# Patient Record
Sex: Male | Born: 1940 | Race: White | Hispanic: No | Marital: Married | Smoking: Never smoker
Health system: Southern US, Community
[De-identification: ages and names within clinical notes are randomized; demographics above are authoritative.]

## PROBLEM LIST (undated history)

## (undated) DIAGNOSIS — I519 Heart disease, unspecified: Secondary | ICD-10-CM

## (undated) DIAGNOSIS — E119 Type 2 diabetes mellitus without complications: Secondary | ICD-10-CM

## (undated) DIAGNOSIS — I10 Essential (primary) hypertension: Secondary | ICD-10-CM

## (undated) DIAGNOSIS — L570 Actinic keratosis: Secondary | ICD-10-CM

## (undated) DIAGNOSIS — I480 Paroxysmal atrial fibrillation: Secondary | ICD-10-CM

## (undated) HISTORY — DX: Actinic keratosis: L57.0

## (undated) HISTORY — DX: Morbid (severe) obesity due to excess calories: E66.01

## (undated) HISTORY — DX: Heart disease, unspecified: I51.9

## (undated) HISTORY — DX: Type 2 diabetes mellitus without complications: E11.9

## (undated) HISTORY — DX: Essential (primary) hypertension: I10

## (undated) HISTORY — DX: Paroxysmal atrial fibrillation: I48.0

---

## 2007-12-23 ENCOUNTER — Ambulatory Visit: Payer: Self-pay | Admitting: Family

## 2008-06-11 HISTORY — PX: INSERT / REPLACE / REMOVE PACEMAKER: SUR710

## 2009-03-03 ENCOUNTER — Other Ambulatory Visit: Payer: Self-pay | Admitting: Internal Medicine

## 2009-03-05 ENCOUNTER — Inpatient Hospital Stay: Payer: Self-pay | Admitting: Cardiovascular Disease

## 2009-08-16 DIAGNOSIS — D239 Other benign neoplasm of skin, unspecified: Secondary | ICD-10-CM

## 2009-08-16 HISTORY — DX: Other benign neoplasm of skin, unspecified: D23.9

## 2010-01-12 DIAGNOSIS — C4492 Squamous cell carcinoma of skin, unspecified: Secondary | ICD-10-CM

## 2010-01-12 HISTORY — DX: Squamous cell carcinoma of skin, unspecified: C44.92

## 2010-02-14 ENCOUNTER — Ambulatory Visit: Payer: Self-pay | Admitting: Ophthalmology

## 2010-02-27 ENCOUNTER — Ambulatory Visit: Payer: Self-pay | Admitting: Ophthalmology

## 2010-11-08 ENCOUNTER — Ambulatory Visit: Payer: Self-pay | Admitting: Ophthalmology

## 2010-11-14 ENCOUNTER — Ambulatory Visit: Payer: Self-pay | Admitting: Ophthalmology

## 2011-03-19 ENCOUNTER — Encounter: Payer: Self-pay | Admitting: Internal Medicine

## 2011-04-12 ENCOUNTER — Encounter: Payer: Self-pay | Admitting: Internal Medicine

## 2011-05-12 ENCOUNTER — Encounter: Payer: Self-pay | Admitting: Internal Medicine

## 2011-06-12 ENCOUNTER — Encounter: Payer: Self-pay | Admitting: Internal Medicine

## 2011-07-31 DIAGNOSIS — R42 Dizziness and giddiness: Secondary | ICD-10-CM | POA: Insufficient documentation

## 2011-07-31 DIAGNOSIS — I2510 Atherosclerotic heart disease of native coronary artery without angina pectoris: Secondary | ICD-10-CM | POA: Insufficient documentation

## 2011-07-31 DIAGNOSIS — E785 Hyperlipidemia, unspecified: Secondary | ICD-10-CM | POA: Insufficient documentation

## 2011-07-31 DIAGNOSIS — M1990 Unspecified osteoarthritis, unspecified site: Secondary | ICD-10-CM | POA: Insufficient documentation

## 2011-07-31 DIAGNOSIS — I25708 Atherosclerosis of coronary artery bypass graft(s), unspecified, with other forms of angina pectoris: Secondary | ICD-10-CM | POA: Insufficient documentation

## 2011-07-31 DIAGNOSIS — Z95810 Presence of automatic (implantable) cardiac defibrillator: Secondary | ICD-10-CM | POA: Insufficient documentation

## 2011-07-31 DIAGNOSIS — I255 Ischemic cardiomyopathy: Secondary | ICD-10-CM | POA: Insufficient documentation

## 2011-07-31 DIAGNOSIS — I472 Ventricular tachycardia: Secondary | ICD-10-CM | POA: Insufficient documentation

## 2011-08-01 DIAGNOSIS — I4891 Unspecified atrial fibrillation: Secondary | ICD-10-CM | POA: Insufficient documentation

## 2011-08-01 DIAGNOSIS — R350 Frequency of micturition: Secondary | ICD-10-CM | POA: Insufficient documentation

## 2011-09-03 ENCOUNTER — Ambulatory Visit: Payer: Self-pay | Admitting: Internal Medicine

## 2011-09-10 ENCOUNTER — Ambulatory Visit: Payer: Self-pay | Admitting: Internal Medicine

## 2011-10-10 ENCOUNTER — Ambulatory Visit: Payer: Self-pay | Admitting: Internal Medicine

## 2011-11-06 DIAGNOSIS — E119 Type 2 diabetes mellitus without complications: Secondary | ICD-10-CM | POA: Insufficient documentation

## 2012-09-11 DIAGNOSIS — Z86006 Personal history of melanoma in-situ: Secondary | ICD-10-CM

## 2012-09-11 HISTORY — DX: Personal history of melanoma in-situ: Z86.006

## 2012-09-24 HISTORY — PX: MELANOMA EXCISION: SHX5266

## 2012-10-16 ENCOUNTER — Ambulatory Visit: Payer: Self-pay | Admitting: General Surgery

## 2012-11-09 ENCOUNTER — Ambulatory Visit: Payer: Self-pay | Admitting: General Surgery

## 2012-11-11 DIAGNOSIS — Z85828 Personal history of other malignant neoplasm of skin: Secondary | ICD-10-CM

## 2012-11-11 HISTORY — DX: Personal history of other malignant neoplasm of skin: Z85.828

## 2014-07-18 DIAGNOSIS — I213 ST elevation (STEMI) myocardial infarction of unspecified site: Secondary | ICD-10-CM | POA: Insufficient documentation

## 2014-08-17 ENCOUNTER — Encounter: Admit: 2014-08-17 | Disposition: A | Discharge status: Home / Self Care | Payer: Self-pay

## 2014-09-10 ENCOUNTER — Emergency Department
Admit: 2014-09-10 | Disposition: A | Discharge status: Other Acute Inpatient Hospital | Payer: Self-pay | Admitting: Emergency Medicine

## 2014-09-10 ENCOUNTER — Encounter: Admit: 2014-09-10 | Disposition: A | Discharge status: Home / Self Care | Payer: Self-pay

## 2014-09-10 DIAGNOSIS — R079 Chest pain, unspecified: Secondary | ICD-10-CM | POA: Insufficient documentation

## 2014-09-10 LAB — COMPREHENSIVE METABOLIC PANEL
ALBUMIN: 4.5 g/dL
Alkaline Phosphatase: 59 U/L
Anion Gap: 6 — ABNORMAL LOW (ref 7–16)
BILIRUBIN TOTAL: 0.6 mg/dL
BUN: 18 mg/dL
Calcium, Total: 9 mg/dL
Chloride: 104 mmol/L
Co2: 26 mmol/L
Creatinine: 1.18 mg/dL
EGFR (Non-African Amer.): >60
Glucose: 166 mg/dL — ABNORMAL HIGH
Potassium: 4.6 mmol/L
SGOT(AST): 22 U/L
SGPT (ALT): 17 U/L
SODIUM: 136 mmol/L
TOTAL PROTEIN: 7.7 g/dL

## 2014-09-10 LAB — CBC
HCT: 41.7 % (ref 40.0–52.0)
HGB: 13.8 g/dL (ref 13.0–18.0)
MCH: 30.4 pg (ref 26.0–34.0)
MCHC: 33.1 g/dL (ref 32.0–36.0)
MCV: 92 fL (ref 80–100)
PLATELETS: 198 10*3/uL (ref 150–440)
RBC: 4.54 10*6/uL (ref 4.40–5.90)
RDW: 13.6 % (ref 11.5–14.5)
WBC: 7 10*3/uL (ref 3.8–10.6)

## 2014-09-10 LAB — TROPONIN I: Troponin-I: <0.03 ng/mL

## 2014-10-04 DIAGNOSIS — Z8589 Personal history of malignant neoplasm of other organs and systems: Secondary | ICD-10-CM

## 2014-10-04 HISTORY — DX: Personal history of malignant neoplasm of other organs and systems: Z85.89

## 2014-10-27 ENCOUNTER — Telehealth: Payer: Self-pay | Admitting: *Deleted

## 2014-10-27 NOTE — Telephone Encounter (Signed)
absent checking return to program status  LMOM

## 2014-10-31 ENCOUNTER — Encounter: Payer: Self-pay | Admitting: *Deleted

## 2014-10-31 NOTE — Progress Notes (Signed)
Documentation from previous EMR to update the Individualized Treatment Plan in [facility].

## 2014-11-02 ENCOUNTER — Encounter: Payer: Self-pay | Admitting: *Deleted

## 2014-11-02 NOTE — Progress Notes (Signed)
Cardiac Individual Treatment Plan  Patient Details  Name: Brandon Wall MRN: [id number] Date of Birth: Nov 08, 1940 Referring Provider:  Dr. Lurline Del Initial Encounter Date:  08/17/2014 Diagnosis  AMI  PTCA  Patient's Home Medications on Admission: No current outpatient prescriptions on file.  Past Medical History: No past medical history on file.  Tobacco Use: History  Smoking status  . Not on file  Smokeless tobacco  . Not on file    Labs: Recent Review Flowsheet Data    There is no flowsheet data to display.       Exercise Target Goals:    Exercise Program Goal: Individual exercise prescription set with THRR, safety & activity barriers. Participant demonstrates ability to understand and report RPE using BORG scale, to self-measure pulse accurately, and to acknowledge the importance of the exercise prescription.  Exercise Prescription Goal: Starting with aerobic activity 30 plus minutes a day, 3 days per week for initial exercise prescription. Provide home exercise prescription and guidelines that participant acknowledges understanding prior to discharge.  Activity Barriers & Risk Stratification:     Activity Barriers & Risk Stratification - 10/31/14 1254    Activity Barriers & Risk Stratification   Activity Barriers None   Risk Stratification High      6 Minute Walk:     6 Minute Walk      08/17/14 1255       6 Minute Walk   Phase Initial     Distance 1590 feet     Walk Time 6 minutes     Resting HR 66 bpm     Resting BP 98/62 mmHg     Max Ex. HR 84 bpm     Max Ex. BP 112/62 mmHg     RPE 12     Symptoms No        Initial Exercise Prescription:   Exercise Prescription Changes:     Exercise Prescription Changes      11/02/14 0700           Exercise Review   Progression No  Absent since last review          Discharge Exercise Prescription:   Nutrition:  Target Goals: Understanding of nutrition guidelines, daily intake  of sodium 1500mg , cholesterol 200mg , calories 30% from fat and 7% or less from saturated fats, daily to have 5 or more servings of fruits and vegetables.  Biometrics:      Post Biometrics - 08/17/14 1256     Post  Biometrics   Height 6\' 6"  (1.981 m)   Weight 243 lb (110.224 kg)   Waist Circumference 42 inches   Hip Circumference 44 inches   Waist to Hip Ratio 0.95 %   BMI (Calculated) 28.1      Nutrition Therapy Plan and Nutrition Goals:   Nutrition Discharge: Rate Your Plate Scores:   Nutrition Goals Re-Evaluation:   Psychosocial: Target Goals: Acknowledge presence or absence of depression, maximize coping skills, provide positive support system. Participant is able to verbalize types and ability to use techniques and skills needed for reducing stress and depression.  Initial Review & Psychosocial Screening:   Quality of Life Scores:     Quality of Life - 08/17/14 1259    Quality of Life Scores   Health/Function Pre 24.8 %   Socioeconomic Pre 28 %   Psych/Spiritual Pre 29.14 %   Family Pre 27.6 %   GLOBAL Pre 26.8 %      PHQ-9:  Recent Review Flowsheet Data    There is no flowsheet data to display.      Psychosocial Evaluation and Intervention:   Psychosocial Re-Evaluation:   Vocational Rehabilitation: Provide vocational rehab assistance to qualifying candidates.   Vocational Rehab Evaluation & Intervention:     Vocational Rehab - 10/31/14 1255    Initial Vocational Rehab Evaluation & Intervention   Assessment shows need for Vocational Rehabilitation No      Education: Education Goals: Education classes will be provided on a weekly basis, covering required topics. Participant will state understanding/return demonstration of topics presented.  Learning Barriers/Preferences:     Learning Barriers/Preferences - 10/31/14 1254    Learning Barriers/Preferences   Learning Barriers Hearing   Learning Preferences None      Education  Topics: General Nutrition Guidelines/Fats and Fiber: -Group instruction provided by verbal, written material, models and posters to present the general guidelines for heart healthy nutrition. Gives an explanation and review of dietary fats and fiber.   Controlling Sodium/Reading Food Labels: -Group verbal and written material supporting the discussion of sodium use in heart healthy nutrition. Review and explanation with models, verbal and written materials for utilization of the food label.   Exercise Physiology & Risk Factors: - Group verbal and written instruction with models to review the exercise physiology of the cardiovascular system and associated critical values. Details cardiovascular disease risk factors and the goals associated with each risk factor.   Aerobic Exercise & Resistance Training: - Gives group verbal and written discussion on the health impact of inactivity. On the components of aerobic and resistive training programs and the benefits of this training and how to safely progress through these programs.   Flexibility, Balance, General Exercise Guidelines: - Provides group verbal and written instruction on the benefits of flexibility and balance training programs. Provides general exercise guidelines with specific guidelines to those with heart or lung disease. Demonstration and skill practice provided.   Stress Management: - Provides group verbal and written instruction about the health risks of elevated stress, cause of high stress, and healthy ways to reduce stress.   Depression: - Provides group verbal and written instruction on the correlation between heart/lung disease and depressed mood, treatment options, and the stigmas associated with seeking treatment.   Anatomy & Physiology of the Heart: - Group verbal and written instruction and models provide basic cardiac anatomy and physiology, with the coronary electrical and arterial systems. Review of: AMI, Angina,  Valve disease, Heart Failure, Cardiac Arrhythmia, Pacemakers, and the ICD.   Cardiac Procedures: - Group verbal and written instruction and models to describe the testing methods done to diagnose heart disease. Reviews the outcomes of the test results. Describes the treatment choices: Medical Management, Angioplasty, or Coronary Bypass Surgery.   Cardiac Medications: - Group verbal and written instruction to review commonly prescribed medications for heart disease. Reviews the medication, class of the drug, and side effects. Includes the steps to properly store meds and maintain the prescription regimen.   Go Sex-Intimacy & Heart Disease, Get SMART - Goal Setting: - Group verbal and written instruction through game format to discuss heart disease and the return to sexual intimacy. Provides group verbal and written material to discuss and apply goal setting through the application of the S.M.A.R.T. Method.   Other Matters of the Heart: - Provides group verbal, written materials and models to describe Heart Failure, Angina, Valve Disease, and Diabetes in the realm of heart disease. Includes description of the disease process and treatment options  available to the cardiac patient.   Exercise & Equipment Safety: - Individual verbal instruction and demonstration of equipment use and safety with use of the equipment.   Infection Prevention: - Provides verbal and written material to individual with discussion of infection control including proper hand washing and proper equipment cleaning during exercise session.   Falls Prevention: - Provides verbal and written material to individual with discussion of falls prevention and safety.   Diabetes: - Individual verbal and written instruction to review signs/symptoms of diabetes, desired ranges of glucose level fasting, after meals and with exercise. Advice that pre and post exercise glucose checks will be done for 3 sessions at entry of  program.    Knowledge Questionnaire Score:   Personal Goals and Risk Factors at Admission:     Personal Goals and Risk Factors at Admission - 08/17/14 1257    Personal Goals and Risk Factors on Admission    Weight Management Yes   Intervention Learn and follow the exercise and diet guidelines while in the program. Utilize the nutrition and education classes to help gain knowledge of the diet and exercise expectations in the program   Admit Weight 243 lb (110.224 kg)   Goal Weight 220 lb (99.791 kg)   Diabetes Yes   Goal Blood glucose control identified by blood glucose values, HgbA1C. Participant verbalizes understanding of the signs/symptoms of hyper/hypo glycemia, proper foot care and importance of medication and nutrition plan for blood glucose control.   Intervention Provide nutrition & aerobic exercise along with prescribed medications to achieve blood glucose in normal ranges: Fasting 65-99 mg/dL   Hypertension Yes   Goal Participant will see blood pressure controlled within the values of 140/22mm/Hg or within value directed by their physician.   Intervention Provide nutrition & aerobic exercise along with prescribed medications to achieve BP 140/90 or less.   Lipids Yes   Goal Cholesterol controlled with medications as prescribed, with individualized exercise RX and with personalized nutrition plan. Value goals: LDL < 70mg , HDL > 40mg . Participant states understanding of desired cholesterol values and following prescriptions.   Intervention Provide nutrition & aerobic exercise along with prescribed medications to achieve LDL 70mg , HDL >40mg .   Stress No      Personal Goals and Risk Factors Review:    Personal Goals Discharge:     Comments: 30 day review   Has been out since 09/10/2014.  Calls made no response to calls.

## 2014-11-04 ENCOUNTER — Other Ambulatory Visit: Payer: Self-pay | Admitting: *Deleted

## 2014-11-04 DIAGNOSIS — I2111 ST elevation (STEMI) myocardial infarction involving right coronary artery: Secondary | ICD-10-CM

## 2014-11-10 ENCOUNTER — Encounter: Payer: Medicare Other | Attending: Internal Medicine

## 2014-11-10 DIAGNOSIS — Z9861 Coronary angioplasty status: Secondary | ICD-10-CM | POA: Insufficient documentation

## 2014-11-12 ENCOUNTER — Telehealth: Payer: Self-pay | Admitting: *Deleted

## 2014-11-12 NOTE — Telephone Encounter (Signed)
I called Brandon Wall and he wants to start back in Cardiac Rehab this Wed 11/17/2014. He had another heart cath but he reports they were unable to stent so they are treating it medically and with exercise.

## 2014-11-25 ENCOUNTER — Telehealth: Payer: Self-pay | Admitting: *Deleted

## 2014-11-25 NOTE — Telephone Encounter (Signed)
Left message for Colin  to call [facility] to let us know when he plans to return to the program

## 2014-11-29 ENCOUNTER — Encounter: Payer: Medicare Other | Admitting: *Deleted

## 2014-11-29 ENCOUNTER — Encounter: Payer: Self-pay | Admitting: *Deleted

## 2014-11-29 DIAGNOSIS — Z9861 Coronary angioplasty status: Secondary | ICD-10-CM | POA: Diagnosis not present

## 2014-11-29 NOTE — Progress Notes (Signed)
Daily Session Note  Patient Details  Name: Brandon Wall MRN: [id number] Date of Birth: 1940-08-07 Referring Provider:  [name], MD  Encounter Date: 11/29/2014  Check In:     Session Check In - 11/29/14 0942    Check-In   Staff Present Heath Lark RN, BSN, CCRP;Leonilda Cozby RN, BSN;Kelly Amedeo Plenty BS, ACSM CEP Exercise Physiologist   ER physicians immediately available to respond to emergencies See telemetry face sheet for immediately available ER MD   Medication changes reported     Yes   Fall or balance concerns reported    No   Warm-up and Cool-down Performed on first and last piece of equipment   VAD Patient? No   Pain Assessment   Currently in Pain? No/denies         Goals Met:  Proper associated with RPD/PD & O2 Sat Exercise tolerated well  Goals Unmet:  Not Applicable  Goals Comments: No chest pain today with exercise.   Dr. Emily Filbert is Medical Director for [place] and [name].

## 2014-11-30 ENCOUNTER — Encounter: Payer: Self-pay | Admitting: *Deleted

## 2014-11-30 DIAGNOSIS — Z9861 Coronary angioplasty status: Secondary | ICD-10-CM

## 2014-11-30 NOTE — Progress Notes (Signed)
Cardiac Individual Treatment Plan  Patient Details  Name: Brandon Wall MRN: [id number] Date of Birth: [birth year] Referring Provider:  Dr. Thompson Grayer Initial Encounter Date:  07/29/2014  Visit Diagnosis: S/P PTCA (percutaneous transluminal coronary angioplasty)  Patient's Home Medications on Admission:  Current outpatient prescriptions:  .  apixaban (ELIQUIS) 5 MG TABS tablet, TAKE 1 TABLET BY MOUTH TWICE DAILY, Disp: , Rfl:  .  clopidogrel (PLAVIX) 75 MG tablet, Take by mouth., Disp: , Rfl:  .  glipiZIDE (GLUCOTROL) 5 MG tablet, Take by mouth., Disp: , Rfl:  .  glucose blood test strip, , Disp: , Rfl:  .  isosorbide mononitrate (IMDUR) 30 MG 24 hr tablet, Take by mouth., Disp: , Rfl:  .  ketoconazole (NIZORAL) 2 % cream, , Disp: , Rfl:  .  losartan (COZAAR) 100 MG tablet, TAKE 1 TABLET BY MOUTH EVERY DAY, Disp: , Rfl:  .  metFORMIN (GLUCOPHAGE) 1000 MG tablet, Take by mouth., Disp: , Rfl:  .  metoprolol succinate (TOPROL-XL) 100 MG 24 hr tablet, Take by mouth., Disp: , Rfl:  .  nitroGLYCERIN (NITROSTAT) 0.4 MG SL tablet, Place under the tongue., Disp: , Rfl:  .  rosuvastatin (CRESTOR) 40 MG tablet, Take by mouth., Disp: , Rfl:   Past Medical History: No past medical history on file.  Tobacco Use: History  Smoking status  . Not on file  Smokeless tobacco  . Not on file    Labs: Recent Review Flowsheet Data    There is no flowsheet data to display.       Exercise Target Goals:    Exercise Program Goal: Individual exercise prescription set with THRR, safety & activity barriers. Participant demonstrates ability to understand and report RPE using BORG scale, to self-measure pulse accurately, and to acknowledge the importance of the exercise prescription.  Exercise Prescription Goal: Starting with aerobic activity 30 plus minutes a day, 3 days per week for initial exercise prescription. Provide home exercise prescription and guidelines that participant acknowledges  understanding prior to discharge.  Activity Barriers & Risk Stratification:     Activity Barriers & Risk Stratification - 10/31/14 1254    Activity Barriers & Risk Stratification   Activity Barriers None   Risk Stratification High      6 Minute Walk:     6 Minute Walk      08/17/14 1255       6 Minute Walk   Phase Initial     Distance 1590 feet     Walk Time 6 minutes     Resting HR 66 bpm     Resting BP 98/62 mmHg     Max Ex. HR 84 bpm     Max Ex. BP 112/62 mmHg     RPE 12     Symptoms No        Initial Exercise Prescription:   Exercise Prescription Changes:     Exercise Prescription Changes      11/02/14 0700 11/29/14 1600 11/30/14 1100       Exercise Review   Progression No  Absent since last review No  Returned 11/29/14, easing back into previous workloads      Response to Exercise   Blood Pressure (Admit)   126/70 mmHg     Blood Pressure (Exercise)   130/80 mmHg     Blood Pressure (Exit)   122/70 mmHg     Heart Rate (Admit)   73 bpm     Heart Rate (Exercise)   102 bpm  Heart Rate (Exit)   62 bpm     Rating of Perceived Exertion (Exercise)   11     Duration   Progress to 30 minutes of continuous aerobic without signs/symptoms of physical distress     Intensity   Rest + 30     Progression   Continue progressive overload as per policy without signs/symptoms or physical distress.     Resistance Training   Training Prescription   Yes     Weight   body weight     Reps   10-12     Treadmill   MPH   3.2     Grade   2     Minutes   15     NuStep   Level   2     Watts   40     Minutes   15     REL-XR   Level   5     Watts   80     Minutes   10        Discharge Exercise Prescription (Final Exercise Prescription Changes):     Exercise Prescription Changes - 11/30/14 1100    Response to Exercise   Blood Pressure (Admit) 126/70 mmHg   Blood Pressure (Exercise) 130/80 mmHg   Blood Pressure (Exit) 122/70 mmHg   Heart Rate (Admit) 73 bpm    Heart Rate (Exercise) 102 bpm   Heart Rate (Exit) 62 bpm   Rating of Perceived Exertion (Exercise) 11   Duration Progress to 30 minutes of continuous aerobic without signs/symptoms of physical distress   Intensity Rest + 30   Progression Continue progressive overload as per policy without signs/symptoms or physical distress.   Resistance Training   Training Prescription Yes   Weight body weight   Reps 10-12   Treadmill   MPH 3.2   Grade 2   Minutes 15   NuStep   Level 2   Watts 40   Minutes 15   REL-XR   Level 5   Watts 80   Minutes 10      Nutrition:  Target Goals: Understanding of nutrition guidelines, daily intake of sodium 1500mg , cholesterol 200mg , calories 30% from fat and 7% or less from saturated fats, daily to have 5 or more servings of fruits and vegetables.  Biometrics:      Post Biometrics - 08/17/14 1256     Post  Biometrics   Height 6\' 6"  (1.981 m)   Weight 243 lb (110.224 kg)   Waist Circumference 42 inches   Hip Circumference 44 inches   Waist to Hip Ratio 0.95 %   BMI (Calculated) 28.1      Nutrition Therapy Plan and Nutrition Goals:   Nutrition Discharge: Rate Your Plate Scores:   Nutrition Goals Re-Evaluation:     Nutrition Goals Re-Evaluation      11/29/14 0943           Personal Goal #1 Re-Evaluation   Personal Goal #1 Eats healthy already       Goal Progress Seen Yes          Psychosocial: Target Goals: Acknowledge presence or absence of depression, maximize coping skills, provide positive support system. Participant is able to verbalize types and ability to use techniques and skills needed for reducing stress and depression.  Initial Review & Psychosocial Screening:   Quality of Life Scores:     Quality of Life - 08/17/14 1259    Quality of Life Scores  Health/Function Pre 24.8 %   Socioeconomic Pre 28 %   Psych/Spiritual Pre 29.14 %   Family Pre 27.6 %   GLOBAL Pre 26.8 %      PHQ-9:     Recent Review  Flowsheet Data    Depression screen [facility] 2/9 11/29/2014   Decreased Interest 0   Down, Depressed, Hopeless 0   PHQ - 2 Score 0      Psychosocial Evaluation and Intervention:   Psychosocial Re-Evaluation:   Vocational Rehabilitation: Provide vocational rehab assistance to qualifying candidates.   Vocational Rehab Evaluation & Intervention:     Vocational Rehab - 10/31/14 1255    Initial Vocational Rehab Evaluation & Intervention   Assessment shows need for Vocational Rehabilitation No      Education: Education Goals: Education classes will be provided on a weekly basis, covering required topics. Participant will state understanding/return demonstration of topics presented.  Learning Barriers/Preferences:     Learning Barriers/Preferences - 10/31/14 1254    Learning Barriers/Preferences   Learning Barriers Hearing   Learning Preferences None      Education Topics: General Nutrition Guidelines/Fats and Fiber: -Group instruction provided by verbal, written material, models and posters to present the general guidelines for heart healthy nutrition. Gives an explanation and review of dietary fats and fiber.   Controlling Sodium/Reading Food Labels: -Group verbal and written material supporting the discussion of sodium use in heart healthy nutrition. Review and explanation with models, verbal and written materials for utilization of the food label.   Exercise Physiology & Risk Factors: - Group verbal and written instruction with models to review the exercise physiology of the cardiovascular system and associated critical values. Details cardiovascular disease risk factors and the goals associated with each risk factor.   Aerobic Exercise & Resistance Training: - Gives group verbal and written discussion on the health impact of inactivity. On the components of aerobic and resistive training programs and the benefits of this training and how to safely progress through these  programs.   Flexibility, Balance, General Exercise Guidelines: - Provides group verbal and written instruction on the benefits of flexibility and balance training programs. Provides general exercise guidelines with specific guidelines to those with heart or lung disease. Demonstration and skill practice provided.   Stress Management: - Provides group verbal and written instruction about the health risks of elevated stress, cause of high stress, and healthy ways to reduce stress.   Depression: - Provides group verbal and written instruction on the correlation between heart/lung disease and depressed mood, treatment options, and the stigmas associated with seeking treatment.   Anatomy & Physiology of the Heart: - Group verbal and written instruction and models provide basic cardiac anatomy and physiology, with the coronary electrical and arterial systems. Review of: AMI, Angina, Valve disease, Heart Failure, Cardiac Arrhythmia, Pacemakers, and the ICD.   Cardiac Procedures: - Group verbal and written instruction and models to describe the testing methods done to diagnose heart disease. Reviews the outcomes of the test results. Describes the treatment choices: Medical Management, Angioplasty, or Coronary Bypass Surgery.   Cardiac Medications: - Group verbal and written instruction to review commonly prescribed medications for heart disease. Reviews the medication, class of the drug, and side effects. Includes the steps to properly store meds and maintain the prescription regimen.   Go Sex-Intimacy & Heart Disease, Get SMART - Goal Setting: - Group verbal and written instruction through game format to discuss heart disease and the return to sexual intimacy. Provides group verbal and  written material to discuss and apply goal setting through the application of the S.M.A.R.T. Method.   Other Matters of the Heart: - Provides group verbal, written materials and models to describe Heart  Failure, Angina, Valve Disease, and Diabetes in the realm of heart disease. Includes description of the disease process and treatment options available to the cardiac patient.   Exercise & Equipment Safety: - Individual verbal instruction and demonstration of equipment use and safety with use of the equipment.   Infection Prevention: - Provides verbal and written material to individual with discussion of infection control including proper hand washing and proper equipment cleaning during exercise session.   Falls Prevention: - Provides verbal and written material to individual with discussion of falls prevention and safety.   Diabetes: - Individual verbal and written instruction to review signs/symptoms of diabetes, desired ranges of glucose level fasting, after meals and with exercise. Advice that pre and post exercise glucose checks will be done for 3 sessions at entry of program.    Knowledge Questionnaire Score:   Personal Goals and Risk Factors at Admission:     Personal Goals and Risk Factors at Admission - 11/29/14 0944    Personal Goals and Risk Factors on Admission   Increase Aerobic Exercise and Physical Activity Yes   Intervention While in program, learn and follow the exercise prescription taught. Start at a low level workload and increase workload after able to maintain previous level for 30 minutes. Increase time before increasing intensity.   Diabetes Yes   Goal Blood glucose control identified by blood glucose values, HgbA1C. Participant verbalizes understanding of the signs/symptoms of hyper/hypo glycemia, proper foot care and importance of medication and nutrition plan for blood glucose control.      Personal Goals and Risk Factors Review:      Goals and Risk Factor Review      11/30/14 1114           Weight Management   Goals Progress/Improvement seen No       Comments Weight flucuates 3-4 pounds. Has been out of program for symptoms.        Increase  Aerobic Exercise and Physical Activity   Goals Progress/Improvement seen  No       Comments Has been out of program for symptoms.       Diabetes   Goal Blood glucose control identified by blood glucose values, HgbA1C. Participant verbalizes understanding of the signs/symptoms of hyper/hypo glycemia, proper foot care and importance of medication and nutrition plan for blood glucose control.       Progress seen towards goals Unknown       Comments no comments provided       Hypertension   Goal Participant will see blood pressure controlled within the values of 140/24mm/Hg or within value directed by their physician.       Progress seen toward goals Yes       Comments BP within normal ranges       Abnormal Lipids   Progress seen towards goals Unknown       Comments No recent lab results to compare          Personal Goals Discharge:     Comments: 30 day review. Continue with ITP.    HAs been out for symptoms.  Cath done, unable to stent; medical management for symptoms at present time. Returned to program

## 2014-12-01 ENCOUNTER — Other Ambulatory Visit: Payer: Self-pay | Admitting: *Deleted

## 2014-12-01 DIAGNOSIS — Z9861 Coronary angioplasty status: Secondary | ICD-10-CM

## 2014-12-03 ENCOUNTER — Encounter: Payer: Medicare Other | Admitting: *Deleted

## 2014-12-03 DIAGNOSIS — Z9861 Coronary angioplasty status: Secondary | ICD-10-CM

## 2014-12-03 NOTE — Progress Notes (Signed)
Daily Session Note  Patient Details  Name: Brandon Wall MRN: [id number] Date of Birth: 1941/03/22 Referring Provider:  [name], MD  Encounter Date: 12/03/2014  Check In:      Goals Met:  Independence with exercise equipment Exercise tolerated well No report of cardiac concerns or symptoms Strength training completed today  Goals Unmet:  Not Applicable  Goals Comments:    Dr. Emily Filbert is Medical Director for [place] and [name].

## 2014-12-06 ENCOUNTER — Encounter: Payer: Medicare Other | Admitting: *Deleted

## 2014-12-06 DIAGNOSIS — Z9861 Coronary angioplasty status: Secondary | ICD-10-CM | POA: Diagnosis not present

## 2014-12-06 NOTE — Progress Notes (Signed)
Daily Session Note  Patient Details  Name: MCKOY BHAKTA MRN: [id number] Date of Birth: 1941-05-16 Referring Provider:  [name], MD  Encounter Date: 12/06/2014  Check In:     Session Check In - 12/06/14 1017    Check-In   Staff Present Heath Lark RN, BSN, CCRP;Kelly Hayes BS, ACSM CEP Exercise Physiologist;Elzy Tomasello [place] MS, ACSM CEP Exercise Physiologist   ER physicians immediately available to respond to emergencies See telemetry face sheet for immediately available ER MD   Medication changes reported     No   Fall or balance concerns reported    No   Warm-up and Cool-down Performed on first and last piece of equipment   VAD Patient? No   Pain Assessment   Currently in Pain? No/denies   Multiple Pain Sites No         Goals Met:  Independence with exercise equipment Exercise tolerated well No report of cardiac concerns or symptoms Strength training completed today  Goals Unmet:  Not Applicable  Goals Comments:   Dr. Emily Filbert is Medical Director for [place] and [name].

## 2014-12-10 ENCOUNTER — Encounter: Payer: Medicare Other | Attending: Cardiology | Admitting: *Deleted

## 2014-12-10 DIAGNOSIS — Z9861 Coronary angioplasty status: Secondary | ICD-10-CM | POA: Insufficient documentation

## 2014-12-10 NOTE — Progress Notes (Signed)
Daily Session Note  Patient Details  Name: TERRIUS GENTILE MRN: [id number] Date of Birth: Oct 18, 1940 Referring Provider:  [name], MD  Encounter Date: 12/10/2014  Check In:     Session Check In - 12/10/14 0908    Check-In   Staff Present Heath Lark RN, BSN, CCRP;Renee Dillard [place] MS, ACSM CEP Exercise Physiologist;Carroll Enterkin RN, BSN   ER physicians immediately available to respond to emergencies See telemetry face sheet for immediately available ER MD   Medication changes reported     No   Fall or balance concerns reported    No   Warm-up and Cool-down Performed on first and last piece of equipment   VAD Patient? No   Pain Assessment   Currently in Pain? No/denies         Goals Met:  Independence with exercise equipment Exercise tolerated well No report of cardiac concerns or symptoms Strength training completed today  Goals Unmet:  Not Applicable  Goals Comments: Jontavius has been doing well with exercise and medication for control of angina symptoms.  He does state that he is somewhat more fatigued, hopefully as he gets back to exercise routine this will resolve.  He is golfing 18 holes every Wednesday.   Dr. Emily Filbert is Medical Director for [place] and [name].

## 2014-12-14 ENCOUNTER — Encounter: Payer: Self-pay | Admitting: *Deleted

## 2014-12-20 ENCOUNTER — Encounter: Payer: Medicare Other | Admitting: *Deleted

## 2014-12-20 DIAGNOSIS — Z9861 Coronary angioplasty status: Secondary | ICD-10-CM

## 2014-12-20 NOTE — Progress Notes (Signed)
Daily Session Note  Patient Details  Name: DEMITRIOS MOLYNEUX MRN: [id number] Date of Birth: [birth year] Referring Provider:  No ref. provider found  Encounter Date: 12/20/2014  Check In:     Session Check In - 12/20/14 0909    Check-In   Staff Present Candiss Norse MS, ACSM CEP Exercise Physiologist;Susanne Bice RN, BSN, CCRP;Denae Zulueta Alfonso Patten, ACSM CEP Exercise Physiologist   ER physicians immediately available to respond to emergencies See telemetry face sheet for immediately available ER MD   Medication changes reported     No   Fall or balance concerns reported    No   Warm-up and Cool-down Performed on first and last piece of equipment   VAD Patient? No   Pain Assessment   Currently in Pain? No/denies   Multiple Pain Sites No         Goals Met:  Independence with exercise equipment Exercise tolerated well No report of cardiac concerns or symptoms  Goals Unmet:  Not Applicable  Goals Comments:    Dr. Emily Filbert is Medical Director for [place] and [name].

## 2014-12-27 DIAGNOSIS — Z9861 Coronary angioplasty status: Secondary | ICD-10-CM | POA: Diagnosis not present

## 2014-12-27 NOTE — Progress Notes (Signed)
Daily Session Note  Patient Details  Name: Brandon Wall MRN: [id number] Date of Birth: Apr 28, 1941 Referring Provider:  No ref. provider found  Encounter Date: 12/27/2014  Check In:     Session Check In - 12/27/14 1207    Check-In   Staff Present Heath Lark RN, BSN, CCRP;Kelly Hayes BS, ACSM CEP Exercise Physiologist;Renee [place] MS, ACSM CEP Exercise Physiologist   ER physicians immediately available to respond to emergencies See telemetry face sheet for immediately available ER MD   Medication changes reported     No   Fall or balance concerns reported    No   Warm-up and Cool-down Performed on first and last piece of equipment   VAD Patient? No   Pain Assessment   Currently in Pain? No/denies           Exercise Prescription Changes - 12/27/14 1200    Exercise Review   Progression No  Working slowly back to previous workloads   Response to Exercise   Blood Pressure (Admit) 110/68 mmHg   Blood Pressure (Exercise) 130/62 mmHg   Blood Pressure (Exit) 100/64 mmHg   Heart Rate (Admit) 76 bpm   Heart Rate (Exercise) 84 bpm   Heart Rate (Exit) 72 bpm   Rating of Perceived Exertion (Exercise) 12   Symptoms No   Duration Progress to 30 minutes of continuous aerobic without signs/symptoms of physical distress   Intensity Rest + 30   Progression Continue progressive overload as per policy without signs/symptoms or physical distress.   Resistance Training   Training Prescription Yes   Weight body weight   Reps 10-15   Interval Training   Interval Training Yes   Equipment REL-XR   Comments L4-6 40-70w   Treadmill   MPH 2.8   Grade 0   Minutes 10   NuStep   Level 2   Watts 40   Minutes 15   Recumbant Elliptical   Level 4   RPM 40   Watts 40   Minutes 20   REL-XR   Level 5   Watts 80   Minutes 10      Goals Met:  Independence with exercise equipment Exercise tolerated well No report of cardiac concerns or symptoms Strength training completed  today  Goals Unmet:  Not Applicable  Goals Comments: Progressing well with his exercise prescription.    Dr. Emily Filbert is Medical Director for [place] and [name].

## 2014-12-28 ENCOUNTER — Encounter: Payer: Self-pay | Admitting: *Deleted

## 2014-12-28 DIAGNOSIS — Z9861 Coronary angioplasty status: Secondary | ICD-10-CM

## 2014-12-28 NOTE — Progress Notes (Signed)
Cardiac Individual Treatment Plan  Patient Details  Name: Brandon Wall MRN: [id number] Date of Birth: Sep 09, 1940 Referring Provider:  No ref. provider found  Initial Encounter Date:    Visit Diagnosis: S/P PTCA (percutaneous transluminal coronary angioplasty)  Patient's Home Medications on Admission:  Current outpatient prescriptions:  .  apixaban (ELIQUIS) 5 MG TABS tablet, TAKE 1 TABLET BY MOUTH TWICE DAILY, Disp: , Rfl:  .  clopidogrel (PLAVIX) 75 MG tablet, Take by mouth., Disp: , Rfl:  .  glipiZIDE (GLUCOTROL) 5 MG tablet, Take by mouth., Disp: , Rfl:  .  glucose blood test strip, , Disp: , Rfl:  .  isosorbide mononitrate (IMDUR) 30 MG 24 hr tablet, Take by mouth., Disp: , Rfl:  .  ketoconazole (NIZORAL) 2 % cream, , Disp: , Rfl:  .  losartan (COZAAR) 100 MG tablet, TAKE 1 TABLET BY MOUTH EVERY DAY, Disp: , Rfl:  .  metFORMIN (GLUCOPHAGE) 1000 MG tablet, Take by mouth., Disp: , Rfl:  .  metoprolol succinate (TOPROL-XL) 100 MG 24 hr tablet, Take by mouth., Disp: , Rfl:  .  nitroGLYCERIN (NITROSTAT) 0.4 MG SL tablet, Place under the tongue., Disp: , Rfl:  .  rosuvastatin (CRESTOR) 40 MG tablet, Take by mouth., Disp: , Rfl:   Past Medical History: No past medical history on file.  Tobacco Use: History  Smoking status  . Not on file  Smokeless tobacco  . Not on file    Labs: Recent Review Flowsheet Data    There is no flowsheet data to display.       Exercise Target Goals:    Exercise Program Goal: Individual exercise prescription set with THRR, safety & activity barriers. Participant demonstrates ability to understand and report RPE using BORG scale, to self-measure pulse accurately, and to acknowledge the importance of the exercise prescription.  Exercise Prescription Goal: Starting with aerobic activity 30 plus minutes a day, 3 days per week for initial exercise prescription. Provide home exercise prescription and guidelines that participant acknowledges  understanding prior to discharge.  Activity Barriers & Risk Stratification:     Activity Barriers & Risk Stratification - 10/31/14 1254    Activity Barriers & Risk Stratification   Activity Barriers None   Risk Stratification High      6 Minute Walk:     6 Minute Walk      08/17/14 1255       6 Minute Walk   Phase Initial     Distance 1590 feet     Walk Time 6 minutes     Resting HR 66 bpm     Resting BP 98/62 mmHg     Max Ex. HR 84 bpm     Max Ex. BP 112/62 mmHg     RPE 12     Symptoms No        Initial Exercise Prescription:   Exercise Prescription Changes:     Exercise Prescription Changes      11/02/14 0700 11/29/14 1600 11/30/14 1100 12/14/14 0700 12/27/14 1200   Exercise Review   Progression No  Absent since last review No  Returned 11/29/14, easing back into previous workloads  No  Working slowly back to previous workloads No  Working slowly back to previous workloads   Response to Exercise   Blood Pressure (Admit)   126/70 mmHg 110/68 mmHg 110/68 mmHg   Blood Pressure (Exercise)   130/80 mmHg 130/62 mmHg 130/62 mmHg   Blood Pressure (Exit)   122/70 mmHg 100/64 mmHg 100/64  mmHg   Heart Rate (Admit)   73 bpm 76 bpm 76 bpm   Heart Rate (Exercise)   102 bpm 84 bpm 84 bpm   Heart Rate (Exit)   62 bpm 72 bpm 72 bpm   Rating of Perceived Exertion (Exercise)   11 12 12    Symptoms    No No   Duration   Progress to 30 minutes of continuous aerobic without signs/symptoms of physical distress Progress to 30 minutes of continuous aerobic without signs/symptoms of physical distress Progress to 30 minutes of continuous aerobic without signs/symptoms of physical distress   Intensity   Rest + 30 Rest + 30 Rest + 30   Progression   Continue progressive overload as per policy without signs/symptoms or physical distress. Continue progressive overload as per policy without signs/symptoms or physical distress. Continue progressive overload as per policy without  signs/symptoms or physical distress.   Resistance Training   Training Prescription   Yes Yes Yes   Weight   body weight body weight body weight   Reps   10-12 10-15 10-15   Interval Training   Interval Training    No Yes   Equipment     REL-XR   Comments     L4-6 40-70w   Treadmill   MPH   3.2 2.8 2.8   Grade   2 0 0   Minutes   15 10 10    NuStep   Level   2 2 2    Watts   40 40 40   Minutes   15 15 15    Recumbant Elliptical   Level    4 4   RPM    40 40   Watts    40 40   Minutes    20 20   REL-XR   Level   5 5 5    Watts   80 80 80   Minutes   10 10 10       Discharge Exercise Prescription (Final Exercise Prescription Changes):     Exercise Prescription Changes - 12/27/14 1200    Exercise Review   Progression No  Working slowly back to previous workloads   Response to Exercise   Blood Pressure (Admit) 110/68 mmHg   Blood Pressure (Exercise) 130/62 mmHg   Blood Pressure (Exit) 100/64 mmHg   Heart Rate (Admit) 76 bpm   Heart Rate (Exercise) 84 bpm   Heart Rate (Exit) 72 bpm   Rating of Perceived Exertion (Exercise) 12   Symptoms No   Duration Progress to 30 minutes of continuous aerobic without signs/symptoms of physical distress   Intensity Rest + 30   Progression Continue progressive overload as per policy without signs/symptoms or physical distress.   Resistance Training   Training Prescription Yes   Weight body weight   Reps 10-15   Interval Training   Interval Training Yes   Equipment REL-XR   Comments L4-6 40-70w   Treadmill   MPH 2.8   Grade 0   Minutes 10   NuStep   Level 2   Watts 40   Minutes 15   Recumbant Elliptical   Level 4   RPM 40   Watts 40   Minutes 20   REL-XR   Level 5   Watts 80   Minutes 10      Nutrition:  Target Goals: Understanding of nutrition guidelines, daily intake of sodium 1500mg , cholesterol 200mg , calories 30% from fat and 7% or less from saturated fats, daily to have  5 or more servings of fruits and  vegetables.  Biometrics:      Post Biometrics - 08/17/14 1256     Post  Biometrics   Height 6\' 6"  (1.981 m)   Weight 243 lb (110.224 kg)   Waist Circumference 42 inches   Hip Circumference 44 inches   Waist to Hip Ratio 0.95 %   BMI (Calculated) 28.1      Nutrition Therapy Plan and Nutrition Goals:   Nutrition Discharge: Rate Your Plate Scores:   Nutrition Goals Re-Evaluation:     Nutrition Goals Re-Evaluation      11/29/14 0943 12/20/14 1342         Personal Goal #1 Re-Evaluation   Personal Goal #1 Eats healthy already Eats healthy already      Goal Progress Seen Yes Yes      Comments  Continues to work on healthy eating habits.  Discussed steps to help. Looking at food labels, limiting fried foods(which [place] does).          Psychosocial: Target Goals: Acknowledge presence or absence of depression, maximize coping skills, provide positive support system. Participant is able to verbalize types and ability to use techniques and skills needed for reducing stress and depression.  Initial Review & Psychosocial Screening:   Quality of Life Scores:     Quality of Life - 08/17/14 1259    Quality of Life Scores   Health/Function Pre 24.8 %   Socioeconomic Pre 28 %   Psych/Spiritual Pre 29.14 %   Family Pre 27.6 %   GLOBAL Pre 26.8 %      PHQ-9:     Recent Review Flowsheet Data    Depression screen PHQ 2/9 11/29/2014   Decreased Interest 0   Down, Depressed, Hopeless 0   PHQ - 2 Score 0      Psychosocial Evaluation and Intervention:   Psychosocial Re-Evaluation:   Vocational Rehabilitation: Provide vocational rehab assistance to qualifying candidates.   Vocational Rehab Evaluation & Intervention:     Vocational Rehab - 10/31/14 1255    Initial Vocational Rehab Evaluation & Intervention   Assessment shows need for Vocational Rehabilitation No      Education: Education Goals: Education classes will be provided on a weekly basis, covering  required topics. Participant will state understanding/return demonstration of topics presented.  Learning Barriers/Preferences:     Learning Barriers/Preferences - 10/31/14 1254    Learning Barriers/Preferences   Learning Barriers Hearing   Learning Preferences None      Education Topics: General Nutrition Guidelines/Fats and Fiber: -Group instruction provided by verbal, written material, models and posters to present the general guidelines for heart healthy nutrition. Gives an explanation and review of dietary fats and fiber.          Cardiac Rehab from 11/29/2014 in [facility]   Date  11/29/14   Educator  C. Joneen Caraway, R   Instruction Review Code  2- meets goals/outcomes      Controlling Sodium/Reading Food Labels: -Group verbal and written material supporting the discussion of sodium use in heart healthy nutrition. Review and explanation with models, verbal and written materials for utilization of the food label.   Exercise Physiology & Risk Factors: - Group verbal and written instruction with models to review the exercise physiology of the cardiovascular system and associated critical values. Details cardiovascular disease risk factors and the goals associated with each risk factor.   Aerobic Exercise & Resistance Training: - Gives group verbal and written discussion on the  health impact of inactivity. On the components of aerobic and resistive training programs and the benefits of this training and how to safely progress through these programs.   Flexibility, Balance, General Exercise Guidelines: - Provides group verbal and written instruction on the benefits of flexibility and balance training programs. Provides general exercise guidelines with specific guidelines to those with heart or lung disease. Demonstration and skill practice provided.   Stress Management: - Provides group verbal and written instruction about the health risks of elevated stress, cause of  high stress, and healthy ways to reduce stress.   Depression: - Provides group verbal and written instruction on the correlation between heart/lung disease and depressed mood, treatment options, and the stigmas associated with seeking treatment.   Anatomy & Physiology of the Heart: - Group verbal and written instruction and models provide basic cardiac anatomy and physiology, with the coronary electrical and arterial systems. Review of: AMI, Angina, Valve disease, Heart Failure, Cardiac Arrhythmia, Pacemakers, and the ICD.   Cardiac Procedures: - Group verbal and written instruction and models to describe the testing methods done to diagnose heart disease. Reviews the outcomes of the test results. Describes the treatment choices: Medical Management, Angioplasty, or Coronary Bypass Surgery.   Cardiac Medications: - Group verbal and written instruction to review commonly prescribed medications for heart disease. Reviews the medication, class of the drug, and side effects. Includes the steps to properly store meds and maintain the prescription regimen.   Go Sex-Intimacy & Heart Disease, Get SMART - Goal Setting: - Group verbal and written instruction through game format to discuss heart disease and the return to sexual intimacy. Provides group verbal and written material to discuss and apply goal setting through the application of the S.M.A.R.T. Method.   Other Matters of the Heart: - Provides group verbal, written materials and models to describe Heart Failure, Angina, Valve Disease, and Diabetes in the realm of heart disease. Includes description of the disease process and treatment options available to the cardiac patient.   Exercise & Equipment Safety: - Individual verbal instruction and demonstration of equipment use and safety with use of the equipment.   Infection Prevention: - Provides verbal and written material to individual with discussion of infection control including proper  hand washing and proper equipment cleaning during exercise session.   Falls Prevention: - Provides verbal and written material to individual with discussion of falls prevention and safety.   Diabetes: - Individual verbal and written instruction to review signs/symptoms of diabetes, desired ranges of glucose level fasting, after meals and with exercise. Advice that pre and post exercise glucose checks will be done for 3 sessions at entry of program.    Knowledge Questionnaire Score:   Personal Goals and Risk Factors at Admission:     Personal Goals and Risk Factors at Admission - 11/29/14 0944    Personal Goals and Risk Factors on Admission   Increase Aerobic Exercise and Physical Activity Yes   Intervention While in program, learn and follow the exercise prescription taught. Start at a low level workload and increase workload after able to maintain previous level for 30 minutes. Increase time before increasing intensity.   Diabetes Yes   Goal Blood glucose control identified by blood glucose values, HgbA1C. Participant verbalizes understanding of the signs/symptoms of hyper/hypo glycemia, proper foot care and importance of medication and nutrition plan for blood glucose control.      Personal Goals and Risk Factors Review:      Goals and Risk Factor Review  11/30/14 1114 12/20/14 1344         Weight Management   Goals Progress/Improvement seen No Yes      Comments Weight flucuates 3-4 pounds. Has been out of program for symptoms.  Weight unchaged from admission. HAs been out for medical concerns.  Has noticed some looseness in clothing      Increase Aerobic Exercise and Physical Activity   Goals Progress/Improvement seen  No Yes      Comments Has been out of program for symptoms. Exercise routine is doing better.  Did review exercising at home. Has a stationary bike and will try to start using it 1 - 2 days a week      Diabetes   Goal Blood glucose control identified by  blood glucose values, HgbA1C. Participant verbalizes understanding of the signs/symptoms of hyper/hypo glycemia, proper foot care and importance of medication and nutrition plan for blood glucose control.       Progress seen towards goals Unknown Yes      Comments no comments provided       Hypertension   Goal Participant will see blood pressure controlled within the values of 140/48mm/Hg or within value directed by their physician.       Progress seen toward goals Yes Yes      Comments BP within normal ranges BP within normal ranges      Abnormal Lipids   Progress seen towards goals Unknown Unknown      Comments No recent lab results to compare No recent lab results to compare         Personal Goals Discharge:     Comments: 30 day review. Continue with ITP.

## 2014-12-29 ENCOUNTER — Other Ambulatory Visit: Payer: Self-pay | Admitting: *Deleted

## 2014-12-29 DIAGNOSIS — Z9861 Coronary angioplasty status: Secondary | ICD-10-CM

## 2014-12-31 DIAGNOSIS — Z9861 Coronary angioplasty status: Secondary | ICD-10-CM | POA: Diagnosis not present

## 2015-01-03 ENCOUNTER — Encounter: Payer: Medicare Other | Admitting: *Deleted

## 2015-01-03 DIAGNOSIS — Z9861 Coronary angioplasty status: Secondary | ICD-10-CM

## 2015-01-03 NOTE — Progress Notes (Signed)
Daily Session Note  Patient Details  Name: Brandon Wall MRN: [id number] Date of Birth: 07/27/1940 Referring Provider:  No ref. provider found  Encounter Date: 01/03/2015  Check In:     Session Check In - 01/03/15 0920    Check-In   Staff Present Renee MacMillan MS, ACSM CEP Exercise Physiologist;Kelly Hayes BS, ACSM CEP Exercise Physiologist;Susanne Bice RN, BSN, CCRP   ER physicians immediately available to respond to emergencies See telemetry face sheet for immediately available ER MD   Medication changes reported     No   Fall or balance concerns reported    No   Warm-up and Cool-down Performed on first and last piece of equipment   VAD Patient? No   Pain Assessment   Currently in Pain? No/denies   Multiple Pain Sites No         Goals Met:  Independence with exercise equipment Exercise tolerated well Personal goals reviewed No report of cardiac concerns or symptoms Strength training completed today  Goals Unmet:  Not Applicable  Goals Comments: Worked with [name] today on the REL to make an individualized HIIT routine. He was very receptive to the idea of interval training and enjoyed trying it. He said he will continue to do intervals on the REL and his routine will be reassessed weekly.    Dr. Mark Miller is Medical Director for [facility] and [name]. 

## 2015-01-05 DIAGNOSIS — Z9861 Coronary angioplasty status: Secondary | ICD-10-CM

## 2015-01-05 NOTE — Progress Notes (Signed)
Daily Session Note  Patient Details  Name: CARLSON BELLAND MRN: [id number] Date of Birth: 1940-10-13 Referring Provider:  No ref. provider found  Encounter Date: 01/05/2015  Check In:     Session Check In - 01/05/15 0837    Check-In   Staff Present Heath Lark RN, BSN, CCRP;Jonita Hirota BS, ACSM EP-C, Exercise Physiologist;Renee Dillard [place] MS, ACSM CEP Exercise Physiologist   ER physicians immediately available to respond to emergencies See telemetry face sheet for immediately available ER MD   Medication changes reported     No   Fall or balance concerns reported    No   Warm-up and Cool-down Performed on first and last piece of equipment   VAD Patient? No   Pain Assessment   Currently in Pain? No/denies         Goals Met:  Proper associated with RPD/PD & O2 Sat Exercise tolerated well No report of cardiac concerns or symptoms Strength training completed today  Goals Unmet:  Not Applicable  Goals Comments:    Dr. Emily Filbert is Medical Director for [place] and [name].

## 2015-01-07 DIAGNOSIS — Z9861 Coronary angioplasty status: Secondary | ICD-10-CM

## 2015-01-07 NOTE — Progress Notes (Signed)
Daily Session Note  Patient Details  Name: Brandon Wall MRN: [id number] Date of Birth: 1940/06/16 Referring Provider:  Rusty Aus, MD  Encounter Date: 01/07/2015  Check In:     Session Check In - 01/07/15 0856    Check-In   Staff Present Heath Lark RN, BSN, CCRP;Carroll Enterkin RN, BSN;Other   ER physicians immediately available to respond to emergencies See telemetry face sheet for immediately available ER MD   Medication changes reported     No   Fall or balance concerns reported    No   Warm-up and Cool-down Performed on first and last piece of equipment   VAD Patient? No   Pain Assessment   Currently in Pain? No/denies         Goals Met:  Independence with exercise equipment Exercise tolerated well Personal goals reviewed No report of cardiac concerns or symptoms Strength training completed today  Goals Unmet:  Not Applicable  Goals Comments: Doing well with exercise prescription progression.    Dr. Emily Filbert is Medical Director for [place] and [name].

## 2015-01-10 ENCOUNTER — Encounter: Payer: Medicare Other | Attending: Cardiology | Admitting: *Deleted

## 2015-01-10 DIAGNOSIS — Z9861 Coronary angioplasty status: Secondary | ICD-10-CM | POA: Insufficient documentation

## 2015-01-10 NOTE — Progress Notes (Signed)
Daily Session Note  Patient Details  Name: Brandon Wall MRN: [id number] Date of Birth: 05-03-1941 Referring Provider:  [name], MD  Encounter Date: 01/10/2015  Check In:     Session Check In - 01/10/15 1243    Check-In   Staff Present Heath Lark RN, BSN, CCRP;Renee Dillard Essex MS, ACSM CEP Exercise Physiologist;Other   ER physicians immediately available to respond to emergencies See telemetry face sheet for immediately available ER MD   Medication changes reported     No   Fall or balance concerns reported    No   Warm-up and Cool-down Performed on first and last piece of equipment   VAD Patient? No   Pain Assessment   Currently in Pain? No/denies         Goals Met:  Independence with exercise equipment Exercise tolerated well No report of cardiac concerns or symptoms  Goals Unmet:  Not Applicable  Goals Comments: Doing well with exercise prescription progression.  Staff: Hessie Knows [facility] Exercise Physiologist  Dr. Emily Filbert is Medical Director for [place] and [name].

## 2015-01-17 ENCOUNTER — Telehealth: Payer: Self-pay | Admitting: *Deleted

## 2015-01-17 NOTE — Telephone Encounter (Signed)
Called and said he did not attend Cardiac Rehab today since he has a sore throat and cough but no fever. Hopes to attend  this Wed.

## 2015-01-18 ENCOUNTER — Encounter: Payer: Self-pay | Admitting: *Deleted

## 2015-01-20 ENCOUNTER — Encounter: Payer: Self-pay | Admitting: *Deleted

## 2015-01-20 DIAGNOSIS — Z9861 Coronary angioplasty status: Secondary | ICD-10-CM

## 2015-01-20 NOTE — Progress Notes (Signed)
Cardiac Individual Treatment Plan  Patient Details  Name: Brandon Wall MRN: [id number] Date of Birth: [birth year] Referring Provider:  Dr. Thompson Grayer   Initial Encounter Date:    Visit Diagnosis: S/P PTCA (percutaneous transluminal coronary angioplasty)  Patient's Home Medications on Admission:  Current outpatient prescriptions:  .  apixaban (ELIQUIS) 5 MG TABS tablet, TAKE 1 TABLET BY MOUTH TWICE DAILY, Disp: , Rfl:  .  clopidogrel (PLAVIX) 75 MG tablet, Take by mouth., Disp: , Rfl:  .  glipiZIDE (GLUCOTROL) 5 MG tablet, Take by mouth., Disp: , Rfl:  .  glucose blood test strip, , Disp: , Rfl:  .  isosorbide mononitrate (IMDUR) 30 MG 24 hr tablet, Take by mouth., Disp: , Rfl:  .  ketoconazole (NIZORAL) 2 % cream, , Disp: , Rfl:  .  losartan (COZAAR) 100 MG tablet, TAKE 1 TABLET BY MOUTH EVERY DAY, Disp: , Rfl:  .  metFORMIN (GLUCOPHAGE) 1000 MG tablet, Take by mouth., Disp: , Rfl:  .  metoprolol succinate (TOPROL-XL) 100 MG 24 hr tablet, Take by mouth., Disp: , Rfl:  .  nitroGLYCERIN (NITROSTAT) 0.4 MG SL tablet, Place under the tongue., Disp: , Rfl:  .  rosuvastatin (CRESTOR) 40 MG tablet, Take by mouth., Disp: , Rfl:   Past Medical History: No past medical history on file.  Tobacco Use: History  Smoking status  . Not on file  Smokeless tobacco  . Not on file    Labs: Recent Review Flowsheet Data    There is no flowsheet data to display.       Exercise Target Goals:    Exercise Program Goal: Individual exercise prescription set with THRR, safety & activity barriers. Participant demonstrates ability to understand and report RPE using BORG scale, to self-measure pulse accurately, and to acknowledge the importance of the exercise prescription.  Exercise Prescription Goal: Starting with aerobic activity 30 plus minutes a day, 3 days per week for initial exercise prescription. Provide home exercise prescription and guidelines that participant acknowledges  understanding prior to discharge.  Activity Barriers & Risk Stratification:     Activity Barriers & Risk Stratification - 08/17/14 1500    Activity Barriers & Risk Stratification   Activity Barriers Arthritis   Risk Stratification High      6 Minute Walk:   Initial Exercise Prescription:   Exercise Prescription Changes:     Exercise Prescription Changes      11/29/14 1600 11/30/14 1100 12/14/14 0700 12/27/14 1200 01/18/15 0700   Exercise Review   Progression No  Returned 11/29/14, easing back into previous workloads  No  Working slowly back to previous workloads No  Working slowly back to previous workloads No  Multiple PVCs, no increases until hear back from MD   Response to Exercise   Blood Pressure (Admit)  126/70 mmHg 110/68 mmHg 110/68 mmHg 132/64 mmHg   Blood Pressure (Exercise)  130/80 mmHg 130/62 mmHg 130/62 mmHg 148/78 mmHg   Blood Pressure (Exit)  122/70 mmHg 100/64 mmHg 100/64 mmHg 120/68 mmHg   Heart Rate (Admit)  73 bpm 76 bpm 76 bpm 80 bpm   Heart Rate (Exercise)  102 bpm 84 bpm 84 bpm 105 bpm   Heart Rate (Exit)  62 bpm 72 bpm 72 bpm 85 bpm   Rating of Perceived Exertion (Exercise)  11 12 12 13    Symptoms   No No  Multiple PVCs, no symptoms with them. Sent info to MD   Duration  Progress to 30 minutes of continuous aerobic  without signs/symptoms of physical distress Progress to 30 minutes of continuous aerobic without signs/symptoms of physical distress Progress to 30 minutes of continuous aerobic without signs/symptoms of physical distress Progress to 30 minutes of continuous aerobic without signs/symptoms of physical distress   Intensity  Rest + 30 Rest + 30 Rest + 30 Rest + 30   Progression  Continue progressive overload as per policy without signs/symptoms or physical distress. Continue progressive overload as per policy without signs/symptoms or physical distress. Continue progressive overload as per policy without signs/symptoms or physical distress.  Continue progressive overload as per policy without signs/symptoms or physical distress.   Resistance Training   Training Prescription  Yes Yes Yes Yes   Weight  body weight body weight body weight 8   Reps  10-12 10-15 10-15 10-15   Interval Training   Interval Training   No Yes Yes   Equipment    REL-XR REL-XR   Comments    L4-6 40-70w L4-6 40-70w   Treadmill   MPH  3.2 2.8 2.8 2.8   Grade  2 0 0 0   Minutes  15 10 10 10    NuStep   Level  2 2 2 2    Watts  40 40 40 40   Minutes  15 15 15 15    Recumbant Elliptical   Level   4 4 4    RPM   40 40 40   Watts   40 40 40   Minutes   20 20 20    REL-XR   Level  5 5 5 5    Watts  80 80 80 80   Minutes  10 10 10 10       Discharge Exercise Prescription (Final Exercise Prescription Changes):     Exercise Prescription Changes - 01/18/15 0700    Exercise Review   Progression No  Multiple PVCs, no increases until hear back from MD   Response to Exercise   Blood Pressure (Admit) 132/64 mmHg   Blood Pressure (Exercise) 148/78 mmHg   Blood Pressure (Exit) 120/68 mmHg   Heart Rate (Admit) 80 bpm   Heart Rate (Exercise) 105 bpm   Heart Rate (Exit) 85 bpm   Rating of Perceived Exertion (Exercise) 13   Symptoms  Multiple PVCs, no symptoms with them. Sent info to MD   Duration Progress to 30 minutes of continuous aerobic without signs/symptoms of physical distress   Intensity Rest + 30   Progression Continue progressive overload as per policy without signs/symptoms or physical distress.   Resistance Training   Training Prescription Yes   Weight 8   Reps 10-15   Interval Training   Interval Training Yes   Equipment REL-XR   Comments L4-6 40-70w   Treadmill   MPH 2.8   Grade 0   Minutes 10   NuStep   Level 2   Watts 40   Minutes 15   Recumbant Elliptical   Level 4   RPM 40   Watts 40   Minutes 20   REL-XR   Level 5   Watts 80   Minutes 10      Nutrition:  Target Goals: Understanding of nutrition guidelines, daily  intake of sodium 1500mg , cholesterol 200mg , calories 30% from fat and 7% or less from saturated fats, daily to have 5 or more servings of fruits and vegetables.  Biometrics:    Nutrition Therapy Plan and Nutrition Goals:   Nutrition Discharge: Rate Your Plate Scores:   Nutrition Goals Re-Evaluation:  Nutrition Goals Re-Evaluation      11/29/14 0943 12/20/14 1342         Personal Goal #1 Re-Evaluation   Personal Goal #1 Eats healthy already Eats healthy already      Goal Progress Seen Yes Yes      Comments  Continues to work on healthy eating habits.  Discussed steps to help. Looking at food labels, limiting fried foods(which [place] does).          Psychosocial: Target Goals: Acknowledge presence or absence of depression, maximize coping skills, provide positive support system. Participant is able to verbalize types and ability to use techniques and skills needed for reducing stress and depression.  Initial Review & Psychosocial Screening:   Quality of Life Scores:   PHQ-9:     Recent Review Flowsheet Data    Depression screen [facility] 2/9 11/29/2014   Decreased Interest 0   Down, Depressed, Hopeless 0   PHQ - 2 Score 0      Psychosocial Evaluation and Intervention:   Psychosocial Re-Evaluation:   Vocational Rehabilitation: Provide vocational rehab assistance to qualifying candidates.   Vocational Rehab Evaluation & Intervention:   Education: Education Goals: Education classes will be provided on a weekly basis, covering required topics. Participant will state understanding/return demonstration of topics presented.  Learning Barriers/Preferences:     Learning Barriers/Preferences - 08/17/14 1500    Learning Barriers/Preferences   Learning Barriers None   Learning Preferences None      Education Topics: General Nutrition Guidelines/Fats and Fiber: -Group instruction provided by verbal, written material, models and posters to present the general  guidelines for heart healthy nutrition. Gives an explanation and review of dietary fats and fiber.          Cardiac Rehab from 01/05/2015 in [facility] Card[facility]   Date  11/29/14   Educator  C. Joneen Caraway, R   Instruction Review Code  2- meets goals/outcomes      Controlling Sodium/Reading Food Labels: -Group verbal and written material supporting the discussion of sodium use in heart healthy nutrition. Review and explanation with models, verbal and written materials for utilization of the food label.   Exercise Physiology & Risk Factors: - Group verbal and written instruction with models to review the exercise physiology of the cardiovascular system and associated critical values. Details cardiovascular disease risk factors and the goals associated with each risk factor.   Aerobic Exercise & Resistance Training: - Gives group verbal and written discussion on the health impact of inactivity. On the components of aerobic and resistive training programs and the benefits of this training and how to safely progress through these programs.   Flexibility, Balance, General Exercise Guidelines: - Provides group verbal and written instruction on the benefits of flexibility and balance training programs. Provides general exercise guidelines with specific guidelines to those with heart or lung disease. Demonstration and skill practice provided.   Stress Management: - Provides group verbal and written instruction about the health risks of elevated stress, cause of high stress, and healthy ways to reduce stress.   Depression: - Provides group verbal and written instruction on the correlation between heart/lung disease and depressed mood, treatment options, and the stigmas associated with seeking treatment.   Anatomy & Physiology of the Heart: - Group verbal and written instruction and models provide basic cardiac anatomy and physiology, with the coronary electrical and arterial systems. Review of:  AMI, Angina, Valve disease, Heart Failure, Cardiac Arrhythmia, Pacemakers, and the ICD.   Cardiac Procedures: - Group  verbal and written instruction and models to describe the testing methods done to diagnose heart disease. Reviews the outcomes of the test results. Describes the treatment choices: Medical Management, Angioplasty, or Coronary Bypass Surgery.   Cardiac Medications: - Group verbal and written instruction to review commonly prescribed medications for heart disease. Reviews the medication, class of the drug, and side effects. Includes the steps to properly store meds and maintain the prescription regimen.   Go Sex-Intimacy & Heart Disease, Get SMART - Goal Setting: - Group verbal and written instruction through game format to discuss heart disease and the return to sexual intimacy. Provides group verbal and written material to discuss and apply goal setting through the application of the S.M.A.R.T. Method.   Other Matters of the Heart: - Provides group verbal, written materials and models to describe Heart Failure, Angina, Valve Disease, and Diabetes in the realm of heart disease. Includes description of the disease process and treatment options available to the cardiac patient.   Exercise & Equipment Safety: - Individual verbal instruction and demonstration of equipment use and safety with use of the equipment.   Infection Prevention: - Provides verbal and written material to individual with discussion of infection control including proper hand washing and proper equipment cleaning during exercise session.   Falls Prevention: - Provides verbal and written material to individual with discussion of falls prevention and safety.   Diabetes: - Individual verbal and written instruction to review signs/symptoms of diabetes, desired ranges of glucose level fasting, after meals and with exercise. Advice that pre and post exercise glucose checks will be done for 3 sessions at entry  of program.    Knowledge Questionnaire Score:   Personal Goals and Risk Factors at Admission:     Personal Goals and Risk Factors at Admission - 11/29/14 0944    Personal Goals and Risk Factors on Admission   Increase Aerobic Exercise and Physical Activity Yes   Intervention While in program, learn and follow the exercise prescription taught. Start at a low level workload and increase workload after able to maintain previous level for 30 minutes. Increase time before increasing intensity.   Diabetes Yes   Goal Blood glucose control identified by blood glucose values, HgbA1C. Participant verbalizes understanding of the signs/symptoms of hyper/hypo glycemia, proper foot care and importance of medication and nutrition plan for blood glucose control.      Personal Goals and Risk Factors Review:      Goals and Risk Factor Review      11/30/14 1114 12/20/14 1344 01/07/15 1312       Weight Management   Goals Progress/Improvement seen No Yes Yes     Comments Weight flucuates 3-4 pounds. Has been out of program for symptoms.  Weight unchaged from admission. HAs been out for medical concerns.  Has noticed some looseness in clothing Continues to notice inches dropping.     Increase Aerobic Exercise and Physical Activity   Goals Progress/Improvement seen  No Yes      Comments Has been out of program for symptoms. Exercise routine is doing better.  Did review exercising at home. Has a stationary bike and will try to start using it 1 - 2 days a week Doing well with Exercise prescription     Diabetes   Goal Blood glucose control identified by blood glucose values, HgbA1C. Participant verbalizes understanding of the signs/symptoms of hyper/hypo glycemia, proper foot care and importance of medication and nutrition plan for blood glucose control.  Progress seen towards goals Unknown Yes Yes     Comments no comments provided  FBS 120-140  Hopes to see HgbA1C results lower.     Hypertension    Goal Participant will see blood pressure controlled within the values of 140/88mm/Hg or within value directed by their physician.       Progress seen toward goals Yes Yes      Comments BP within normal ranges BP within normal ranges      Abnormal Lipids   Progress seen towards goals Unknown Unknown      Comments No recent lab results to compare No recent lab results to compare         Personal Goals Discharge:     Comments: 30 day review

## 2015-01-24 ENCOUNTER — Encounter: Payer: Medicare Other | Admitting: *Deleted

## 2015-01-24 DIAGNOSIS — Z9861 Coronary angioplasty status: Secondary | ICD-10-CM

## 2015-01-24 NOTE — Progress Notes (Signed)
Daily Session Note  Patient Details  Name: Brandon Wall MRN: [id number] Date of Birth: 04/24/1941 Referring Provider:  No ref. provider found  Encounter Date: 01/24/2015  Check In:     Session Check In - 01/24/15 0856    Check-In   Staff Present Earlean Shawl BS, ACSM CEP Exercise Physiologist;Carroll Enterkin RN, BSN;Steven Way BS, ACSM EP-C, Exercise Physiologist   ER physicians immediately available to respond to emergencies See telemetry face sheet for immediately available ER MD   Medication changes reported     No   Fall or balance concerns reported    No   Warm-up and Cool-down Performed on first and last piece of equipment   VAD Patient? No   Pain Assessment   Currently in Pain? No/denies   Multiple Pain Sites No         Goals Met:  Independence with exercise equipment Exercise tolerated well No report of cardiac concerns or symptoms Strength training completed today  Goals Unmet:  Not Applicable  Goals Comments:    Dr. Emily Filbert is Medical Director for [name].

## 2015-01-26 ENCOUNTER — Other Ambulatory Visit: Payer: Self-pay | Admitting: *Deleted

## 2015-01-26 DIAGNOSIS — Z9861 Coronary angioplasty status: Secondary | ICD-10-CM

## 2015-01-26 NOTE — Progress Notes (Signed)
Cardiac Individual Treatment Plan  Patient Details  Name: Brandon Wall MRN: [id number] Date of Birth: May 19, 1941 Referring Provider:  No ref. provider found  Initial Encounter Date:    Visit Diagnosis: No diagnosis found.  Patient's Home Medications on Admission:  Current outpatient prescriptions:  .  apixaban (ELIQUIS) 5 MG TABS tablet, TAKE 1 TABLET BY MOUTH TWICE DAILY, Disp: , Rfl:  .  clopidogrel (PLAVIX) 75 MG tablet, Take by mouth., Disp: , Rfl:  .  glipiZIDE (GLUCOTROL) 5 MG tablet, Take by mouth., Disp: , Rfl:  .  glucose blood test strip, , Disp: , Rfl:  .  isosorbide mononitrate (IMDUR) 30 MG 24 hr tablet, Take by mouth., Disp: , Rfl:  .  ketoconazole (NIZORAL) 2 % cream, , Disp: , Rfl:  .  losartan (COZAAR) 100 MG tablet, TAKE 1 TABLET BY MOUTH EVERY DAY, Disp: , Rfl:  .  metFORMIN (GLUCOPHAGE) 1000 MG tablet, Take by mouth., Disp: , Rfl:  .  metoprolol succinate (TOPROL-XL) 100 MG 24 hr tablet, Take by mouth., Disp: , Rfl:  .  nitroGLYCERIN (NITROSTAT) 0.4 MG SL tablet, Place under the tongue., Disp: , Rfl:  .  rosuvastatin (CRESTOR) 40 MG tablet, Take by mouth., Disp: , Rfl:   Past Medical History: No past medical history on file.  Tobacco Use: History  Smoking status  . Not on file  Smokeless tobacco  . Not on file    Labs: Recent Review Flowsheet Data    There is no flowsheet data to display.       Exercise Target Goals:    Exercise Program Goal: Individual exercise prescription set with THRR, safety & activity barriers. Participant demonstrates ability to understand and report RPE using BORG scale, to self-measure pulse accurately, and to acknowledge the importance of the exercise prescription.  Exercise Prescription Goal: Starting with aerobic activity 30 plus minutes a day, 3 days per week for initial exercise prescription. Provide home exercise prescription and guidelines that participant acknowledges understanding prior to  discharge.  Activity Barriers & Risk Stratification:     Activity Barriers & Risk Stratification - 08/17/14 1500    Activity Barriers & Risk Stratification   Activity Barriers Arthritis   Risk Stratification High      6 Minute Walk:   Initial Exercise Prescription:   Exercise Prescription Changes:     Exercise Prescription Changes      11/29/14 1600 11/30/14 1100 12/14/14 0700 12/27/14 1200 01/18/15 0700   Exercise Review   Progression No  Returned 11/29/14, easing back into previous workloads  No  Working slowly back to previous workloads No  Working slowly back to previous workloads No  Multiple PVCs, no increases until hear back from MD   Response to Exercise   Blood Pressure (Admit)  126/70 mmHg 110/68 mmHg 110/68 mmHg 132/64 mmHg   Blood Pressure (Exercise)  130/80 mmHg 130/62 mmHg 130/62 mmHg 148/78 mmHg   Blood Pressure (Exit)  122/70 mmHg 100/64 mmHg 100/64 mmHg 120/68 mmHg   Heart Rate (Admit)  73 bpm 76 bpm 76 bpm 80 bpm   Heart Rate (Exercise)  102 bpm 84 bpm 84 bpm 105 bpm   Heart Rate (Exit)  62 bpm 72 bpm 72 bpm 85 bpm   Rating of Perceived Exertion (Exercise)  11 12 12 13    Symptoms   No No  Multiple PVCs, no symptoms with them. Sent info to MD   Duration  Progress to 30 minutes of continuous aerobic without signs/symptoms of physical  distress Progress to 30 minutes of continuous aerobic without signs/symptoms of physical distress Progress to 30 minutes of continuous aerobic without signs/symptoms of physical distress Progress to 30 minutes of continuous aerobic without signs/symptoms of physical distress   Intensity  Rest + 30 Rest + 30 Rest + 30 Rest + 30   Progression  Continue progressive overload as per policy without signs/symptoms or physical distress. Continue progressive overload as per policy without signs/symptoms or physical distress. Continue progressive overload as per policy without signs/symptoms or physical distress. Continue progressive overload  as per policy without signs/symptoms or physical distress.   Resistance Training   Training Prescription  Yes Yes Yes Yes   Weight  body weight body weight body weight 8   Reps  10-12 10-15 10-15 10-15   Interval Training   Interval Training   No Yes Yes   Equipment    REL-XR REL-XR   Comments    L4-6 40-70w L4-6 40-70w   Treadmill   MPH  3.2 2.8 2.8 2.8   Grade  2 0 0 0   Minutes  15 10 10 10    NuStep   Level  2 2 2 2    Watts  40 40 40 40   Minutes  15 15 15 15    Recumbant Elliptical   Level   4 4 4    RPM   40 40 40   Watts   40 40 40   Minutes   20 20 20    REL-XR   Level  5 5 5 5    Watts  80 80 80 80   Minutes  10 10 10 10       Discharge Exercise Prescription (Final Exercise Prescription Changes):     Exercise Prescription Changes - 01/18/15 0700    Exercise Review   Progression No  Multiple PVCs, no increases until hear back from MD   Response to Exercise   Blood Pressure (Admit) 132/64 mmHg   Blood Pressure (Exercise) 148/78 mmHg   Blood Pressure (Exit) 120/68 mmHg   Heart Rate (Admit) 80 bpm   Heart Rate (Exercise) 105 bpm   Heart Rate (Exit) 85 bpm   Rating of Perceived Exertion (Exercise) 13   Symptoms  Multiple PVCs, no symptoms with them. Sent info to MD   Duration Progress to 30 minutes of continuous aerobic without signs/symptoms of physical distress   Intensity Rest + 30   Progression Continue progressive overload as per policy without signs/symptoms or physical distress.   Resistance Training   Training Prescription Yes   Weight 8   Reps 10-15   Interval Training   Interval Training Yes   Equipment REL-XR   Comments L4-6 40-70w   Treadmill   MPH 2.8   Grade 0   Minutes 10   NuStep   Level 2   Watts 40   Minutes 15   Recumbant Elliptical   Level 4   RPM 40   Watts 40   Minutes 20   REL-XR   Level 5   Watts 80   Minutes 10      Nutrition:  Target Goals: Understanding of nutrition guidelines, daily intake of sodium 1500mg ,  cholesterol 200mg , calories 30% from fat and 7% or less from saturated fats, daily to have 5 or more servings of fruits and vegetables.  Biometrics:    Nutrition Therapy Plan and Nutrition Goals:   Nutrition Discharge: Rate Your Plate Scores:   Nutrition Goals Re-Evaluation:     Nutrition Goals  Re-Evaluation      11/29/14 0943 12/20/14 1342         Personal Goal #1 Re-Evaluation   Personal Goal #1 Eats healthy already Eats healthy already      Goal Progress Seen Yes Yes      Comments  Continues to work on healthy eating habits.  Discussed steps to help. Looking at food labels, limiting fried foods(which March does).          Psychosocial: Target Goals: Acknowledge presence or absence of depression, maximize coping skills, provide positive support system. Participant is able to verbalize types and ability to use techniques and skills needed for reducing stress and depression.  Initial Review & Psychosocial Screening:   Quality of Life Scores:   PHQ-9:     Recent Review Flowsheet Data    Depression screen [facility] 2/9 11/29/2014   Decreased Interest 0   Down, Depressed, Hopeless 0   PHQ - 2 Score 0      Psychosocial Evaluation and Intervention:   Psychosocial Re-Evaluation:   Vocational Rehabilitation: Provide vocational rehab assistance to qualifying candidates.   Vocational Rehab Evaluation & Intervention:   Education: Education Goals: Education classes will be provided on a weekly basis, covering required topics. Participant will state understanding/return demonstration of topics presented.  Learning Barriers/Preferences:     Learning Barriers/Preferences - 08/17/14 1500    Learning Barriers/Preferences   Learning Barriers None   Learning Preferences None      Education Topics: General Nutrition Guidelines/Fats and Fiber: -Group instruction provided by verbal, written material, models and posters to present the general guidelines for heart  healthy nutrition. Gives an explanation and review of dietary fats and fiber.          Cardiac Rehab from 01/05/2015 in [facility]   Date  11/29/14   Educator  C. Joneen Caraway, R   Instruction Review Code  2- meets goals/outcomes      Controlling Sodium/Reading Food Labels: -Group verbal and written material supporting the discussion of sodium use in heart healthy nutrition. Review and explanation with models, verbal and written materials for utilization of the food label.   Exercise Physiology & Risk Factors: - Group verbal and written instruction with models to review the exercise physiology of the cardiovascular system and associated critical values. Details cardiovascular disease risk factors and the goals associated with each risk factor.   Aerobic Exercise & Resistance Training: - Gives group verbal and written discussion on the health impact of inactivity. On the components of aerobic and resistive training programs and the benefits of this training and how to safely progress through these programs.   Flexibility, Balance, General Exercise Guidelines: - Provides group verbal and written instruction on the benefits of flexibility and balance training programs. Provides general exercise guidelines with specific guidelines to those with heart or lung disease. Demonstration and skill practice provided.   Stress Management: - Provides group verbal and written instruction about the health risks of elevated stress, cause of high stress, and healthy ways to reduce stress.   Depression: - Provides group verbal and written instruction on the correlation between heart/lung disease and depressed mood, treatment options, and the stigmas associated with seeking treatment.   Anatomy & Physiology of the Heart: - Group verbal and written instruction and models provide basic cardiac anatomy and physiology, with the coronary electrical and arterial systems. Review of: AMI, Angina, Valve  disease, Heart Failure, Cardiac Arrhythmia, Pacemakers, and the ICD.   Cardiac Procedures: - Group verbal and  written instruction and models to describe the testing methods done to diagnose heart disease. Reviews the outcomes of the test results. Describes the treatment choices: Medical Management, Angioplasty, or Coronary Bypass Surgery.   Cardiac Medications: - Group verbal and written instruction to review commonly prescribed medications for heart disease. Reviews the medication, class of the drug, and side effects. Includes the steps to properly store meds and maintain the prescription regimen.   Go Sex-Intimacy & Heart Disease, Get SMART - Goal Setting: - Group verbal and written instruction through game format to discuss heart disease and the return to sexual intimacy. Provides group verbal and written material to discuss and apply goal setting through the application of the S.M.A.R.T. Method.   Other Matters of the Heart: - Provides group verbal, written materials and models to describe Heart Failure, Angina, Valve Disease, and Diabetes in the realm of heart disease. Includes description of the disease process and treatment options available to the cardiac patient.   Exercise & Equipment Safety: - Individual verbal instruction and demonstration of equipment use and safety with use of the equipment.   Infection Prevention: - Provides verbal and written material to individual with discussion of infection control including proper hand washing and proper equipment cleaning during exercise session.   Falls Prevention: - Provides verbal and written material to individual with discussion of falls prevention and safety.   Diabetes: - Individual verbal and written instruction to review signs/symptoms of diabetes, desired ranges of glucose level fasting, after meals and with exercise. Advice that pre and post exercise glucose checks will be done for 3 sessions at entry of  program.    Knowledge Questionnaire Score:   Personal Goals and Risk Factors at Admission:     Personal Goals and Risk Factors at Admission - 11/29/14 0944    Personal Goals and Risk Factors on Admission   Increase Aerobic Exercise and Physical Activity Yes   Intervention While in program, learn and follow the exercise prescription taught. Start at a low level workload and increase workload after able to maintain previous level for 30 minutes. Increase time before increasing intensity.   Diabetes Yes   Goal Blood glucose control identified by blood glucose values, HgbA1C. Participant verbalizes understanding of the signs/symptoms of hyper/hypo glycemia, proper foot care and importance of medication and nutrition plan for blood glucose control.      Personal Goals and Risk Factors Review:      Goals and Risk Factor Review      11/30/14 1114 12/20/14 1344 01/07/15 1312 01/26/15 1054     Weight Management   Goals Progress/Improvement seen No Yes Yes     Comments Weight flucuates 3-4 pounds. Has been out of program for symptoms.  Weight unchaged from admission. HAs been out for medical concerns.  Has noticed some looseness in clothing Continues to notice inches dropping.     Increase Aerobic Exercise and Physical Activity   Goals Progress/Improvement seen  No Yes  Yes    Comments Has been out of program for symptoms. Exercise routine is doing better.  Did review exercising at home. Has a stationary bike and will try to start using it 1 - 2 days a week Doing well with Exercise prescription     Diabetes   Goal Blood glucose control identified by blood glucose values, HgbA1C. Participant verbalizes understanding of the signs/symptoms of hyper/hypo glycemia, proper foot care and importance of medication and nutrition plan for blood glucose control.   Blood glucose control identified by  blood glucose values, HgbA1C. Participant verbalizes understanding of the signs/symptoms of hyper/hypo  glycemia, proper foot care and importance of medication and nutrition plan for blood glucose control.    Progress seen towards goals Unknown Yes Yes Yes    Comments no comments provided  FBS 120-140  Hopes to see HgbA1C results lower.     Hypertension   Goal Participant will see blood pressure controlled within the values of 140/54mm/Hg or within value directed by their physician.       Progress seen toward goals Yes Yes      Comments BP within normal ranges BP within normal ranges      Abnormal Lipids   Progress seen towards goals Unknown Unknown      Comments No recent lab results to compare No recent lab results to compare         Personal Goals Discharge:     Comments: Was only out a few days due to a cough but had returned to Cardiac Rehab and doing well. 30 day review.

## 2015-01-28 ENCOUNTER — Encounter: Payer: Medicare Other | Admitting: *Deleted

## 2015-01-28 DIAGNOSIS — Z9861 Coronary angioplasty status: Secondary | ICD-10-CM | POA: Diagnosis not present

## 2015-01-28 NOTE — Progress Notes (Signed)
Daily Session Note  Patient Details  Name: [name] MRN: [id number] Date of Birth: 08/17/1940 Referring Provider:  [name], MD  Encounter Date: 01/28/2015  Check In:     Session Check In - 01/28/15 0914    Check-In   Staff Present Nyoka Cowden RN;Raeshawn Tafolla RN, BSN  Hessie Knows,    ER physicians immediately available to respond to emergencies See telemetry face sheet for immediately available ER MD   Medication changes reported     No   Fall or balance concerns reported    No   Warm-up and Cool-down Performed on first and last piece of equipment   VAD Patient? No   Pain Assessment   Currently in Pain? No/denies         Goals Met:  Proper associated with RPD/PD & O2 Sat Exercise tolerated well  Goals Unmet:  Not Applicable  Goals Comments: Doing well on Treadmill and REL.    Dr. Emily Filbert is Medical Director for [place] and [name].

## 2015-01-31 ENCOUNTER — Encounter: Payer: Medicare Other | Admitting: *Deleted

## 2015-01-31 DIAGNOSIS — Z9861 Coronary angioplasty status: Secondary | ICD-10-CM

## 2015-01-31 NOTE — Progress Notes (Signed)
Daily Session Note  Patient Details  Name: Brandon Wall MRN: [id number] Date of Birth: [birth year] Referring Provider:  [name], MD  Encounter Date: 01/31/2015  Check In:     Session Check In - 01/31/15 0846    Check-In   Staff Present Candiss Norse MS, ACSM CEP Exercise Physiologist;Carroll Enterkin RN, BSN;Kelly Amedeo Plenty BS, ACSM CEP Exercise Physiologist;Susanne Bice RN, BSN, [place]   ER physicians immediately available to respond to emergencies See telemetry face sheet for immediately available ER MD   Medication changes reported     No   Fall or balance concerns reported    No   Warm-up and Cool-down Performed on first and last piece of equipment   VAD Patient? No   Pain Assessment   Currently in Pain? No/denies   Multiple Pain Sites No         Goals Met:  Independence with exercise equipment Exercise tolerated well No report of cardiac concerns or symptoms Strength training completed today  Goals Unmet:  Not Applicable  Goals Comments:    Dr. Emily Filbert is Medical Director for [place] and [name].

## 2015-02-02 DIAGNOSIS — Z9861 Coronary angioplasty status: Secondary | ICD-10-CM | POA: Diagnosis not present

## 2015-02-02 NOTE — Progress Notes (Signed)
Daily Session Note  Patient Details  Name: Brandon Wall MRN: [id number] Date of Birth: [birth year] Referring Provider:  [name], MD  Encounter Date: 02/02/2015  Check In:     Session Check In - 02/02/15 0853    Check-In   Staff Present Heath Lark RN, BSN, CCRP;Maribella Kuna BS, ACSM EP-C, Exercise Physiologist;Renee Dillard [place] MS, ACSM CEP Exercise Physiologist   ER physicians immediately available to respond to emergencies See telemetry face sheet for immediately available ER MD   Medication changes reported     No   Fall or balance concerns reported    No   Warm-up and Cool-down Performed on first and last piece of equipment   VAD Patient? No   Pain Assessment   Currently in Pain? No/denies         Goals Met:  Proper associated with RPD/PD & O2 Sat Exercise tolerated well No report of cardiac concerns or symptoms Strength training completed today  Goals Unmet:  Not Applicable  Goals Comments:    Dr. Emily Filbert is Medical Director for [place] and [name].

## 2015-02-08 ENCOUNTER — Encounter: Payer: Self-pay | Admitting: *Deleted

## 2015-02-11 ENCOUNTER — Encounter: Payer: Medicare Other | Attending: Cardiology | Admitting: *Deleted

## 2015-02-11 DIAGNOSIS — Z9861 Coronary angioplasty status: Secondary | ICD-10-CM | POA: Insufficient documentation

## 2015-02-11 NOTE — Progress Notes (Signed)
Daily Session Note  Patient Details  Name: Brandon Wall MRN: [id number] Date of Birth: August 29, 1940 Referring Provider:  [name], MD  Encounter Date: 02/11/2015  Check In:     Session Check In - 02/11/15 0853    Check-In   Staff Present Nyoka Cowden RN;Renee [place] MS, ACSM CEP Exercise Physiologist;Stephani Janak RN, BSN   ER physicians immediately available to respond to emergencies See telemetry face sheet for immediately available ER MD   Medication changes reported     No   Fall or balance concerns reported    No   Warm-up and Cool-down Performed on first and last piece of equipment   VAD Patient? No   Pain Assessment   Currently in Pain? No/denies         Goals Met:  Proper associated with RPD/PD & O2 Sat Exercise tolerated well No report of cardiac concerns or symptoms  Goals Unmet:  Not Applicable  Goals Comments: Is about 1/2 way through Cardiac Rehab this time. Doing well.    Dr. Emily Filbert is Medical Director for [place] and [name].

## 2015-02-18 ENCOUNTER — Encounter: Payer: Medicare Other | Admitting: *Deleted

## 2015-02-18 DIAGNOSIS — Z9861 Coronary angioplasty status: Secondary | ICD-10-CM | POA: Diagnosis not present

## 2015-02-18 NOTE — Progress Notes (Signed)
Daily Session Note  Patient Details  Name: Brandon Wall MRN: [id number] Date of Birth: 03/05/1941 Referring Provider:  Masoud, Javed, MD  Encounter Date: 02/18/2015  Check In:     Session Check In - 02/18/15 1617    Check-In   Staff Present Susanne Bice RN, BSN, CCRP;Stacey Joyce RRT, RCP Respiratory Therapist;Renee MacMillan MS, ACSM CEP Exercise Physiologist   ER physicians immediately available to respond to emergencies See telemetry face sheet for immediately available ER MD   Medication changes reported     No   Fall or balance concerns reported    No   Warm-up and Cool-down Performed on first and last piece of equipment   VAD Patient? No   Pain Assessment   Currently in Pain? No/denies         Goals Met:  Independence with exercise equipment Exercise tolerated well No report of cardiac concerns or symptoms Strength training completed today  Goals Unmet:  Not Applicable  Goals Comments: Doing well with exercise prescription progression.    Dr. Mark Miller is Medical Director for [facility] and [name]. 

## 2015-02-21 ENCOUNTER — Encounter: Payer: Medicare Other | Admitting: *Deleted

## 2015-02-21 DIAGNOSIS — Z9861 Coronary angioplasty status: Secondary | ICD-10-CM

## 2015-02-21 NOTE — Progress Notes (Signed)
Daily Session Note  Patient Details  Name: KAVEON BLATZ MRN: [id number] Date of Birth: 1940-10-07 Referring Provider:  [name], MD  Encounter Date: 02/21/2015  Check In:     Session Check In - 02/21/15 0848    Check-In   Staff Present Candiss Norse MS, ACSM CEP Exercise Physiologist;Susanne Bice RN, BSN, CCRP;Khyla Mccumbers Alfonso Patten, ACSM CEP Exercise Physiologist   ER physicians immediately available to respond to emergencies See telemetry face sheet for immediately available ER MD   Medication changes reported     No   Fall or balance concerns reported    No   Warm-up and Cool-down Performed on first and last piece of equipment   VAD Patient? No   Pain Assessment   Currently in Pain? No/denies   Multiple Pain Sites No         Goals Met:  Independence with exercise equipment Exercise tolerated well No report of cardiac concerns or symptoms Strength training completed today  Goals Unmet:  Not Applicable  Goals Comments:    Dr. Emily Filbert is Medical Director for [place] and [name].

## 2015-02-22 ENCOUNTER — Encounter: Payer: Self-pay | Admitting: *Deleted

## 2015-02-22 DIAGNOSIS — Z9861 Coronary angioplasty status: Secondary | ICD-10-CM

## 2015-02-22 NOTE — Progress Notes (Signed)
Cardiac Individual Treatment Plan  Patient Details  Name: Brandon Wall MRN: [id number] Date of Birth: 06/22/1940 Referring Provider:  No ref. provider found  Initial Encounter Date:    Visit Diagnosis: S/P PTCA (percutaneous transluminal coronary angioplasty)  Patient's Home Medications on Admission:  Current outpatient prescriptions:  .  apixaban (ELIQUIS) 5 MG TABS tablet, TAKE 1 TABLET BY MOUTH TWICE DAILY, Disp: , Rfl:  .  clopidogrel (PLAVIX) 75 MG tablet, Take by mouth., Disp: , Rfl:  .  glipiZIDE (GLUCOTROL) 5 MG tablet, Take by mouth., Disp: , Rfl:  .  glucose blood test strip, , Disp: , Rfl:  .  isosorbide mononitrate (IMDUR) 30 MG 24 hr tablet, Take by mouth., Disp: , Rfl:  .  ketoconazole (NIZORAL) 2 % cream, , Disp: , Rfl:  .  losartan (COZAAR) 100 MG tablet, TAKE 1 TABLET BY MOUTH EVERY DAY, Disp: , Rfl:  .  metFORMIN (GLUCOPHAGE) 1000 MG tablet, Take by mouth., Disp: , Rfl:  .  metoprolol succinate (TOPROL-XL) 100 MG 24 hr tablet, Take by mouth., Disp: , Rfl:  .  nitroGLYCERIN (NITROSTAT) 0.4 MG SL tablet, Place under the tongue., Disp: , Rfl:  .  rosuvastatin (CRESTOR) 40 MG tablet, Take by mouth., Disp: , Rfl:   Past Medical History: No past medical history on file.  Tobacco Use: History  Smoking status  . Not on file  Smokeless tobacco  . Not on file    Labs: Recent Review Flowsheet Data    There is no flowsheet data to display.       Exercise Target Goals:    Exercise Program Goal: Individual exercise prescription set with THRR, safety & activity barriers. Participant demonstrates ability to understand and report RPE using BORG scale, to self-measure pulse accurately, and to acknowledge the importance of the exercise prescription.  Exercise Prescription Goal: Starting with aerobic activity 30 plus minutes a day, 3 days per week for initial exercise prescription. Provide home exercise prescription and guidelines that participant acknowledges  understanding prior to discharge.  Activity Barriers & Risk Stratification:   6 Minute Walk:   Initial Exercise Prescription:   Exercise Prescription Changes:     Exercise Prescription Changes      11/29/14 1600 11/30/14 1100 12/14/14 0700 12/27/14 1200 01/18/15 0700   Exercise Review   Progression No  Returned 11/29/14, easing back into previous workloads  No  Working slowly back to previous workloads No  Working slowly back to previous workloads No  Multiple PVCs, no increases until hear back from MD   Response to Exercise   Blood Pressure (Admit)  126/70 mmHg 110/68 mmHg 110/68 mmHg 132/64 mmHg   Blood Pressure (Exercise)  130/80 mmHg 130/62 mmHg 130/62 mmHg 148/78 mmHg   Blood Pressure (Exit)  122/70 mmHg 100/64 mmHg 100/64 mmHg 120/68 mmHg   Heart Rate (Admit)  73 bpm 76 bpm 76 bpm 80 bpm   Heart Rate (Exercise)  102 bpm 84 bpm 84 bpm 105 bpm   Heart Rate (Exit)  62 bpm 72 bpm 72 bpm 85 bpm   Rating of Perceived Exertion (Exercise)  11 12 12 13    Symptoms   No No  Multiple PVCs, no symptoms with them. Sent info to MD   Duration  Progress to 30 minutes of continuous aerobic without signs/symptoms of physical distress Progress to 30 minutes of continuous aerobic without signs/symptoms of physical distress Progress to 30 minutes of continuous aerobic without signs/symptoms of physical distress Progress to 30 minutes of  continuous aerobic without signs/symptoms of physical distress   Intensity  Rest + 30 Rest + 30 Rest + 30 Rest + 30   Progression  Continue progressive overload as per policy without signs/symptoms or physical distress. Continue progressive overload as per policy without signs/symptoms or physical distress. Continue progressive overload as per policy without signs/symptoms or physical distress. Continue progressive overload as per policy without signs/symptoms or physical distress.   Resistance Training   Training Prescription  Yes Yes Yes Yes   Weight  body weight  body weight body weight 8   Reps  10-12 10-15 10-15 10-15   Interval Training   Interval Training   No Yes Yes   Equipment    REL-XR REL-XR   Comments    L4-6 40-70w L4-6 40-70w   Treadmill   MPH  3.2 2.8 2.8 2.8   Grade  2 0 0 0   Minutes  15 10 10 10    NuStep   Level  2 2 2 2    Watts  40 40 40 40   Minutes  15 15 15 15    Recumbant Elliptical   Level   4 4 4    RPM   40 40 40   Watts   40 40 40   Minutes   20 20 20    REL-XR   Level  5 5 5 5    Watts  80 80 80 80   Minutes  10 10 10 10      02/02/15 0800 02/08/15 1000 02/22/15 0600       Exercise Review   Progression No  Multiple PVCs, no increases until hear back from MD No  Multiple PVCs, no increases until hear back from MD Yes     Response to Exercise   Blood Pressure (Admit) 132/64 mmHg  130/82 mmHg     Blood Pressure (Exercise) 148/78 mmHg  148/86 mmHg     Blood Pressure (Exit) 120/68 mmHg  122/64 mmHg     Heart Rate (Admit) 80 bpm  87 bpm     Heart Rate (Exercise) 105 bpm  101 bpm     Heart Rate (Exit) 85 bpm  72 bpm     Rating of Perceived Exertion (Exercise) 13 13 13      Symptoms  Multiple PVCs, no symptoms with them. Sent info to MD  Multiple PVCs, no symptoms with them. Sent info to MD None     Duration Progress to 30 minutes of continuous aerobic without signs/symptoms of physical distress Progress to 30 minutes of continuous aerobic without signs/symptoms of physical distress Progress to 50 minutes of aerobic without signs/symptoms of physical distress     Intensity Rest + 30 Rest + 30 Rest + 30     Progression Continue progressive overload as per policy without signs/symptoms or physical distress. Continue progressive overload as per policy without signs/symptoms or physical distress. Continue progressive overload as per policy without signs/symptoms or physical distress.     Resistance Training   Training Prescription Yes Yes Yes     Weight 8 8 8      Reps 10-15 10-15 10-15     Interval Training   Interval  Training Yes Yes Yes     Equipment REL-XR REL-XR REL-XR     Comments L4-6 40-70w L4-6 40-70w L4-6 40-70w     Treadmill   MPH 2.8 2.8 3.3     Grade 0 0 3     Minutes 10 10 10      NuStep  Level 2 2 2      Watts 40 40 40     Minutes 15 15 15      Recumbant Elliptical   Level 5 5 5      RPM 40 40 40     Watts 40 40 40     Minutes 20 20 20      REL-XR   Level 5 5 5      Watts 80 80 80     Minutes 10 10 10         Discharge Exercise Prescription (Final Exercise Prescription Changes):     Exercise Prescription Changes - 02/22/15 0600    Exercise Review   Progression Yes   Response to Exercise   Blood Pressure (Admit) 130/82 mmHg   Blood Pressure (Exercise) 148/86 mmHg   Blood Pressure (Exit) 122/64 mmHg   Heart Rate (Admit) 87 bpm   Heart Rate (Exercise) 101 bpm   Heart Rate (Exit) 72 bpm   Rating of Perceived Exertion (Exercise) 13   Symptoms None   Duration Progress to 50 minutes of aerobic without signs/symptoms of physical distress   Intensity Rest + 30   Progression Continue progressive overload as per policy without signs/symptoms or physical distress.   Resistance Training   Training Prescription Yes   Weight 8   Reps 10-15   Interval Training   Interval Training Yes   Equipment REL-XR   Comments L4-6 40-70w   Treadmill   MPH 3.3   Grade 3   Minutes 10   NuStep   Level 2   Watts 40   Minutes 15   Recumbant Elliptical   Level 5   RPM 40   Watts 40   Minutes 20   REL-XR   Level 5   Watts 80   Minutes 10      Nutrition:  Target Goals: Understanding of nutrition guidelines, daily intake of sodium 1500mg , cholesterol 200mg , calories 30% from fat and 7% or less from saturated fats, daily to have 5 or more servings of fruits and vegetables.  Biometrics:    Nutrition Therapy Plan and Nutrition Goals:   Nutrition Discharge: Rate Your Plate Scores:   Nutrition Goals Re-Evaluation:     Nutrition Goals Re-Evaluation      11/29/14 0943 12/20/14  1342         Personal Goal #1 Re-Evaluation   Personal Goal #1 Eats healthy already Eats healthy already      Goal Progress Seen Yes Yes      Comments  Continues to work on healthy eating habits.  Discussed steps to help. Looking at food labels, limiting fried foods(which Taevyn does).          Psychosocial: Target Goals: Acknowledge presence or absence of depression, maximize coping skills, provide positive support system. Participant is able to verbalize types and ability to use techniques and skills needed for reducing stress and depression.  Initial Review & Psychosocial Screening:   Quality of Life Scores:   PHQ-9:     Recent Review Flowsheet Data    Depression screen [facility] 2/9 11/29/2014   Decreased Interest 0   Down, Depressed, Hopeless 0   PHQ - 2 Score 0      Psychosocial Evaluation and Intervention:   Psychosocial Re-Evaluation:   Vocational Rehabilitation: Provide vocational rehab assistance to qualifying candidates.   Vocational Rehab Evaluation & Intervention:   Education: Education Goals: Education classes will be provided on a weekly basis, covering required topics. Participant will state understanding/return demonstration of  topics presented.  Learning Barriers/Preferences:   Education Topics: General Nutrition Guidelines/Fats and Fiber: -Group instruction provided by verbal, written material, models and posters to present the general guidelines for heart healthy nutrition. Gives an explanation and review of dietary fats and fiber.      Cardiac Rehab from 01/05/2015 in [facility] Card[facility]   Date  11/29/14   Educator  C. Joneen Caraway, R   Instruction Review Code  2- meets goals/outcomes      Controlling Sodium/Reading Food Labels: -Group verbal and written material supporting the discussion of sodium use in heart healthy nutrition. Review and explanation with models, verbal and written materials for utilization of the food label.   Exercise  Physiology & Risk Factors: - Group verbal and written instruction with models to review the exercise physiology of the cardiovascular system and associated critical values. Details cardiovascular disease risk factors and the goals associated with each risk factor.   Aerobic Exercise & Resistance Training: - Gives group verbal and written discussion on the health impact of inactivity. On the components of aerobic and resistive training programs and the benefits of this training and how to safely progress through these programs.   Flexibility, Balance, General Exercise Guidelines: - Provides group verbal and written instruction on the benefits of flexibility and balance training programs. Provides general exercise guidelines with specific guidelines to those with heart or lung disease. Demonstration and skill practice provided.   Stress Management: - Provides group verbal and written instruction about the health risks of elevated stress, cause of high stress, and healthy ways to reduce stress.   Depression: - Provides group verbal and written instruction on the correlation between heart/lung disease and depressed mood, treatment options, and the stigmas associated with seeking treatment.   Anatomy & Physiology of the Heart: - Group verbal and written instruction and models provide basic cardiac anatomy and physiology, with the coronary electrical and arterial systems. Review of: AMI, Angina, Valve disease, Heart Failure, Cardiac Arrhythmia, Pacemakers, and the ICD.   Cardiac Procedures: - Group verbal and written instruction and models to describe the testing methods done to diagnose heart disease. Reviews the outcomes of the test results. Describes the treatment choices: Medical Management, Angioplasty, or Coronary Bypass Surgery.   Cardiac Medications: - Group verbal and written instruction to review commonly prescribed medications for heart disease. Reviews the medication, class of the  drug, and side effects. Includes the steps to properly store meds and maintain the prescription regimen.   Go Sex-Intimacy & Heart Disease, Get SMART - Goal Setting: - Group verbal and written instruction through game format to discuss heart disease and the return to sexual intimacy. Provides group verbal and written material to discuss and apply goal setting through the application of the S.M.A.R.T. Method.   Other Matters of the Heart: - Provides group verbal, written materials and models to describe Heart Failure, Angina, Valve Disease, and Diabetes in the realm of heart disease. Includes description of the disease process and treatment options available to the cardiac patient.   Exercise & Equipment Safety: - Individual verbal instruction and demonstration of equipment use and safety with use of the equipment.   Infection Prevention: - Provides verbal and written material to individual with discussion of infection control including proper hand washing and proper equipment cleaning during exercise session.   Falls Prevention: - Provides verbal and written material to individual with discussion of falls prevention and safety.   Diabetes: - Individual verbal and written instruction to review signs/symptoms of diabetes,  desired ranges of glucose level fasting, after meals and with exercise. Advice that pre and post exercise glucose checks will be done for 3 sessions at entry of program.    Knowledge Questionnaire Score:   Personal Goals and Risk Factors at Admission:     Personal Goals and Risk Factors at Admission - 11/29/14 0944    Personal Goals and Risk Factors on Admission   Increase Aerobic Exercise and Physical Activity Yes   Intervention While in program, learn and follow the exercise prescription taught. Start at a low level workload and increase workload after able to maintain previous level for 30 minutes. Increase time before increasing intensity.   Diabetes Yes    Goal Blood glucose control identified by blood glucose values, HgbA1C. Participant verbalizes understanding of the signs/symptoms of hyper/hypo glycemia, proper foot care and importance of medication and nutrition plan for blood glucose control.      Personal Goals and Risk Factors Review:      Goals and Risk Factor Review      11/30/14 1114 12/20/14 1344 01/07/15 1312 01/26/15 1054     Weight Management   Goals Progress/Improvement seen No Yes Yes     Comments Weight flucuates 3-4 pounds. Has been out of program for symptoms.  Weight unchaged from admission. HAs been out for medical concerns.  Has noticed some looseness in clothing Continues to notice inches dropping.     Increase Aerobic Exercise and Physical Activity   Goals Progress/Improvement seen  No Yes  Yes    Comments Has been out of program for symptoms. Exercise routine is doing better.  Did review exercising at home. Has a stationary bike and will try to start using it 1 - 2 days a week Doing well with Exercise prescription     Diabetes   Goal Blood glucose control identified by blood glucose values, HgbA1C. Participant verbalizes understanding of the signs/symptoms of hyper/hypo glycemia, proper foot care and importance of medication and nutrition plan for blood glucose control.   Blood glucose control identified by blood glucose values, HgbA1C. Participant verbalizes understanding of the signs/symptoms of hyper/hypo glycemia, proper foot care and importance of medication and nutrition plan for blood glucose control.    Progress seen towards goals Unknown Yes Yes Yes    Comments no comments provided  FBS 120-140  Hopes to see HgbA1C results lower.     Hypertension   Goal Participant will see blood pressure controlled within the values of 140/34mm/Hg or within value directed by their physician.       Progress seen toward goals Yes Yes      Comments BP within normal ranges BP within normal ranges      Abnormal Lipids   Progress  seen towards goals Unknown Unknown      Comments No recent lab results to compare No recent lab results to compare         Personal Goals Discharge:     Comments:  30 day review Continue with current ITP. Attendance 1-2 times a week.

## 2015-02-23 ENCOUNTER — Other Ambulatory Visit: Payer: Self-pay | Admitting: *Deleted

## 2015-02-23 DIAGNOSIS — Z9861 Coronary angioplasty status: Secondary | ICD-10-CM

## 2015-02-25 ENCOUNTER — Encounter: Payer: Medicare Other | Admitting: *Deleted

## 2015-02-25 DIAGNOSIS — Z9861 Coronary angioplasty status: Secondary | ICD-10-CM

## 2015-02-25 NOTE — Progress Notes (Signed)
Daily Session Note  Patient Details  Name: CHAKA JEFFERYS MRN: [id number] Date of Birth: [birth year] Referring Provider:  [name], MD  Encounter Date: 02/25/2015  Check In:     Session Check In - 02/25/15 0944    Check-In   Staff Present Frederich Cha RRT, RCP Respiratory Therapist;Carroll Enterkin RN, Drusilla Kanner MS, ACSM CEP Exercise Physiologist   ER physicians immediately available to respond to emergencies See telemetry face sheet for immediately available ER MD   Medication changes reported     No   Fall or balance concerns reported    No   Warm-up and Cool-down Performed on first and last piece of equipment   VAD Patient? No   Pain Assessment   Currently in Pain? No/denies   Multiple Pain Sites No         Goals Met:  Independence with exercise equipment Exercise tolerated well No report of cardiac concerns or symptoms Strength training completed today  Goals Unmet:  Not Applicable  Goals Comments: Delquan is continuing to feel stronger and golfing 18 holes is more manageable now.    Dr. Emily Filbert is Medical Director for [place] and [name].

## 2015-02-28 ENCOUNTER — Encounter: Payer: Medicare Other | Admitting: *Deleted

## 2015-02-28 DIAGNOSIS — Z9861 Coronary angioplasty status: Secondary | ICD-10-CM | POA: Diagnosis not present

## 2015-02-28 NOTE — Progress Notes (Signed)
Daily Session Note  Patient Details  Name: Brandon Wall MRN: [id number] Date of Birth: 1940/12/06 Referring Provider:  [name], MD  Encounter Date: 02/28/2015  Check In:     Session Check In - 02/28/15 1031    Check-In   Staff Present Candiss Norse MS, ACSM CEP Exercise Physiologist;Susanne Bice RN, BSN, CCRP;Kelly Alfonso Patten, ACSM CEP Exercise Physiologist   ER physicians immediately available to respond to emergencies See telemetry face sheet for immediately available ER MD   Medication changes reported     No   Fall or balance concerns reported    No   Warm-up and Cool-down Performed on first and last piece of equipment   VAD Patient? No   Pain Assessment   Currently in Pain? No/denies   Multiple Pain Sites No         Goals Met:  Independence with exercise equipment Exercise tolerated well No report of cardiac concerns or symptoms Strength training completed today  Goals Unmet:  Not Applicable  Goals Comments: [name] did well with all of his exercise goals today.    Dr. Emily Filbert is Medical Director for [place] and [name].

## 2015-03-02 ENCOUNTER — Encounter: Payer: Self-pay | Admitting: *Deleted

## 2015-03-04 ENCOUNTER — Encounter: Payer: Medicare Other | Admitting: *Deleted

## 2015-03-04 DIAGNOSIS — Z9861 Coronary angioplasty status: Secondary | ICD-10-CM | POA: Diagnosis not present

## 2015-03-04 NOTE — Progress Notes (Signed)
Daily Session Note  Patient Details  Name: TAM DELISLE MRN: [id number] Date of Birth: 01/29/1941 Referring Adryanna Friedt:  [name], MD  Encounter Date: 03/04/2015  Check In:     Session Check In - 03/04/15 0942    Check-In   Staff Present Heath Lark RN, BSN, CCRP;Carroll Enterkin RN, Drusilla Kanner MS, ACSM CEP Exercise Physiologist   ER physicians immediately available to respond to emergencies See telemetry face sheet for immediately available ER MD   Medication changes reported     No   Fall or balance concerns reported    No   Warm-up and Cool-down Performed on first and last piece of equipment   VAD Patient? No   Pain Assessment   Currently in Pain? No/denies         Goals Met:  Proper associated with RPD/PD & O2 Sat Exercise tolerated well  Goals Unmet:  Not Applicable  Goals Comments: Phat enjoys attending Cardiac Rehab and feels it is helping him esp treadmill and Recumbent Elliptical.    Dr. Emily Filbert is Medical Director for [place] and [name].

## 2015-03-07 ENCOUNTER — Encounter: Payer: Medicare Other | Admitting: *Deleted

## 2015-03-07 DIAGNOSIS — Z9861 Coronary angioplasty status: Secondary | ICD-10-CM

## 2015-03-07 NOTE — Progress Notes (Signed)
Daily Session Note  Patient Details  Name: NYSHAWN GOWDY MRN: [id number] Date of Birth: [birth year] Referring Provider:  [name], MD  Encounter Date: 03/07/2015  Check In:     Session Check In - 03/07/15 0855    Check-In   Staff Present Earlean Shawl BS, ACSM CEP Exercise Physiologist;Renee Dillard Essex MS, ACSM CEP Exercise Physiologist;Susanne Bice RN, BSN, [place]   ER physicians immediately available to respond to emergencies See telemetry face sheet for immediately available ER MD   Medication changes reported     No   Fall or balance concerns reported    No   Warm-up and Cool-down Performed on first and last piece of equipment   VAD Patient? No   Pain Assessment   Currently in Pain? No/denies   Multiple Pain Sites No         Goals Met:  Independence with exercise equipment Exercise tolerated well No report of cardiac concerns or symptoms Strength training completed today  Goals Unmet:  Not Applicable  Goals Comments:    Dr. Emily Filbert is Medical Director for [place] and [name].

## 2015-03-14 ENCOUNTER — Encounter: Payer: Medicare Other | Attending: Internal Medicine | Admitting: *Deleted

## 2015-03-14 DIAGNOSIS — Z9861 Coronary angioplasty status: Secondary | ICD-10-CM | POA: Insufficient documentation

## 2015-03-14 NOTE — Progress Notes (Signed)
Daily Session Note  Patient Details  Name: MOMEN HAM MRN: [id number] Date of Birth: January 09, 1941 Referring Provider:  [name], MD  Encounter Date: 03/14/2015  Check In:     Session Check In - 03/14/15 1029    Check-In   Staff Present Candiss Norse MS, ACSM CEP Exercise Physiologist;Finlee Milo Alfonso Patten, ACSM CEP Exercise Physiologist;Carroll Enterkin RN, BSN   ER physicians immediately available to respond to emergencies See telemetry face sheet for immediately available ER MD   Medication changes reported     No   Fall or balance concerns reported    No   Warm-up and Cool-down Performed on first and last piece of equipment   VAD Patient? No   Pain Assessment   Currently in Pain? No/denies   Multiple Pain Sites No           Exercise Prescription Changes - 03/14/15 1000    Exercise Review   Progression Yes   Response to Exercise   Symptoms None   Duration Progress to 50 minutes of aerobic without signs/symptoms of physical distress   Intensity Rest + 30   Progression Continue progressive overload as per policy without signs/symptoms or physical distress.   Resistance Training   Training Prescription Yes   Weight 8   Reps 10-15   Interval Training   Interval Training Yes   Equipment REL-XR   Comments L4-6 40-70w   Treadmill   MPH 3.3   Grade 2   Minutes 10   NuStep   Level 2   Watts 40   Minutes 15   Recumbant Elliptical   Level 5   RPM 45   Watts 40   Minutes 20   REL-XR   Level 5   Watts 80   Minutes 10      Goals Met:  Independence with exercise equipment Exercise tolerated well Personal goals reviewed No report of cardiac concerns or symptoms Strength training completed today  Goals Unmet:  Not Applicable  Goals Comments: Reviewed individualized exercise prescription and made increases per departmental policy. Exercise increases were discussed with the patient and they were able to perform the new work loads without issue (no signs or  symptoms).    Dr. Emily Filbert is Medical Director for [place] and [name].

## 2015-03-16 DIAGNOSIS — Z9861 Coronary angioplasty status: Secondary | ICD-10-CM

## 2015-03-16 NOTE — Progress Notes (Signed)
Daily Session Note  Patient Details  Name: JAMYRON REDD MRN: [id number] Date of Birth: 05-03-1941 Referring Provider:  [name], MD  Encounter Date: 03/16/2015  Check In:     Session Check In - 03/16/15 0926    Check-In   Staff Present Nyoka Cowden RN;Desi Carby BS, ACSM EP-C, Exercise Physiologist;Renee Dillard Essex MS, ACSM CEP Exercise Physiologist   ER physicians immediately available to respond to emergencies See telemetry face sheet for immediately available ER MD   Medication changes reported     No   Fall or balance concerns reported    No   Warm-up and Cool-down Performed on first and last piece of equipment   VAD Patient? No   Pain Assessment   Currently in Pain? No/denies         Goals Met:  Proper associated with RPD/PD & O2 Sat Exercise tolerated well No report of cardiac concerns or symptoms Strength training completed today  Goals Unmet:  Not Applicable  Goals Comments:    Dr. Emily Filbert is Medical Director for [place] and [name].

## 2015-03-21 ENCOUNTER — Encounter: Payer: Medicare Other | Admitting: *Deleted

## 2015-03-21 VITALS — Ht 75.0 in | Wt 238.0 lb

## 2015-03-21 DIAGNOSIS — Z9861 Coronary angioplasty status: Secondary | ICD-10-CM | POA: Diagnosis not present

## 2015-03-21 NOTE — Progress Notes (Signed)
Daily Session Note  Patient Details  Name: Brandon Wall MRN: [id number] Date of Birth: 1941-01-23 Referring Provider:  [name], MD  Encounter Date: 03/21/2015  Check In:     Session Check In - 03/21/15 0906    Check-In   Staff Present Gerlene Burdock RN, BSN;Avraj Lindroth Dillard Essex MS, ACSM CEP Exercise Physiologist;Kelly Alfonso Patten, ACSM CEP Exercise Physiologist   ER physicians immediately available to respond to emergencies See telemetry face sheet for immediately available ER MD   Medication changes reported     No   Fall or balance concerns reported    No   Warm-up and Cool-down Performed on first and last piece of equipment   VAD Patient? No   Pain Assessment   Currently in Pain? No/denies   Multiple Pain Sites No           Exercise Prescription Changes - 03/21/15 0900    Exercise Review   Progression Yes   Response to Exercise   Symptoms None   Comments Patient is approaching graduation of the program and home exercise plans were discussed. Details of the patient's exercise prescription and what they need to do in order to continue the prescription and progress with exercise were outlined and the patient verbalized understanding. The patient plans to complete all exercise at [facility] with his wife or will notify us to join the fitness center in the [facility] program   Duration Progress to 50 minutes of aerobic without signs/symptoms of physical distress   Intensity Rest + 30   Progression Continue progressive overload as per policy without signs/symptoms or physical distress.   Resistance Training   Training Prescription Yes   Weight 8   Reps 10-15   Interval Training   Interval Training Yes   Equipment REL-XR   Comments L4-6 40-70w   Treadmill   MPH 3.3   Grade 2   Minutes 10   NuStep   Level 2   Watts 40   Minutes 15   Recumbant Elliptical   Level 5   RPM 45   Watts 40   Minutes 20   REL-XR   Level 5   Watts 80   Minutes 10   Home Exercise Plan    Plans to continue exercise at [employer] (comment)  [facility] or will call about [facility]      Goals Met:  Independence with exercise equipment Exercise tolerated well Personal goals reviewed No report of cardiac concerns or symptoms Strength training completed today  Goals Unmet:  Not Applicable  Goals Comments: Improved by 5% on discharge 6MW   Dr. Emily Filbert is Medical Director for [name].

## 2015-03-23 ENCOUNTER — Encounter: Payer: Self-pay | Admitting: *Deleted

## 2015-03-23 DIAGNOSIS — Z9861 Coronary angioplasty status: Secondary | ICD-10-CM

## 2015-03-23 NOTE — Progress Notes (Signed)
Cardiac Individual Treatment Plan  Patient Details  Name: Brandon Wall MRN: [id number] Date of Birth: 02/20/1941 Referring Provider:  No ref. provider found  Initial Encounter Date:    Visit Diagnosis: S/P PTCA (percutaneous transluminal coronary angioplasty)  Patient's Home Medications on Admission:  Current outpatient prescriptions:  .  apixaban (ELIQUIS) 5 MG TABS tablet, TAKE 1 TABLET BY MOUTH TWICE DAILY, Disp: , Rfl:  .  clopidogrel (PLAVIX) 75 MG tablet, Take by mouth., Disp: , Rfl:  .  glipiZIDE (GLUCOTROL) 5 MG tablet, Take by mouth., Disp: , Rfl:  .  glucose blood test strip, , Disp: , Rfl:  .  isosorbide mononitrate (IMDUR) 30 MG 24 hr tablet, Take by mouth., Disp: , Rfl:  .  ketoconazole (NIZORAL) 2 % cream, , Disp: , Rfl:  .  losartan (COZAAR) 100 MG tablet, TAKE 1 TABLET BY MOUTH EVERY DAY, Disp: , Rfl:  .  metFORMIN (GLUCOPHAGE) 1000 MG tablet, Take by mouth., Disp: , Rfl:  .  metoprolol succinate (TOPROL-XL) 100 MG 24 hr tablet, Take by mouth., Disp: , Rfl:  .  nitroGLYCERIN (NITROSTAT) 0.4 MG SL tablet, Place under the tongue., Disp: , Rfl:  .  rosuvastatin (CRESTOR) 40 MG tablet, Take by mouth., Disp: , Rfl:   Past Medical History: No past medical history on file.  Tobacco Use: History  Smoking status  . Not on file  Smokeless tobacco  . Not on file    Labs: Recent Review Flowsheet Data    There is no flowsheet data to display.       Exercise Target Goals:    Exercise Program Goal: Individual exercise prescription set with THRR, safety & activity barriers. Participant demonstrates ability to understand and report RPE using BORG scale, to self-measure pulse accurately, and to acknowledge the importance of the exercise prescription.  Exercise Prescription Goal: Starting with aerobic activity 30 plus minutes a day, 3 days per week for initial exercise prescription. Provide home exercise prescription and guidelines that participant acknowledges  understanding prior to discharge.  Activity Barriers & Risk Stratification:   6 Minute Walk:     6 Minute Walk      03/21/15 0908       6 Minute Walk   Phase Discharge     Distance 1670 feet     Distance % Change 5 %     Walk Time 6 minutes     Resting HR 69 bpm     Resting BP 134/80 mmHg     Max Ex. HR 101 bpm     Max Ex. BP 160/72 mmHg     RPE 12     Symptoms No        Initial Exercise Prescription:   Exercise Prescription Changes:     Exercise Prescription Changes      11/29/14 1600 11/30/14 1100 12/14/14 0700 12/27/14 1200 01/18/15 0700   Exercise Review   Progression No  Returned 11/29/14, easing back into previous workloads  No  Working slowly back to previous workloads No  Working slowly back to previous workloads No  Multiple PVCs, no increases until hear back from MD   Response to Exercise   Blood Pressure (Admit)  126/70 mmHg 110/68 mmHg 110/68 mmHg 132/64 mmHg   Blood Pressure (Exercise)  130/80 mmHg 130/62 mmHg 130/62 mmHg 148/78 mmHg   Blood Pressure (Exit)  122/70 mmHg 100/64 mmHg 100/64 mmHg 120/68 mmHg   Heart Rate (Admit)  73 bpm 76 bpm 76 bpm 80 bpm  Heart Rate (Exercise)  102 bpm 84 bpm 84 bpm 105 bpm   Heart Rate (Exit)  62 bpm 72 bpm 72 bpm 85 bpm   Rating of Perceived Exertion (Exercise)  11 12 12 13    Symptoms   No No  Multiple PVCs, no symptoms with them. Sent info to MD   Duration  Progress to 30 minutes of continuous aerobic without signs/symptoms of physical distress Progress to 30 minutes of continuous aerobic without signs/symptoms of physical distress Progress to 30 minutes of continuous aerobic without signs/symptoms of physical distress Progress to 30 minutes of continuous aerobic without signs/symptoms of physical distress   Intensity  Rest + 30 Rest + 30 Rest + 30 Rest + 30   Progression  Continue progressive overload as per policy without signs/symptoms or physical distress. Continue progressive overload as per policy without  signs/symptoms or physical distress. Continue progressive overload as per policy without signs/symptoms or physical distress. Continue progressive overload as per policy without signs/symptoms or physical distress.   Resistance Training   Training Prescription  Yes Yes Yes Yes   Weight  body weight body weight body weight 8   Reps  10-12 10-15 10-15 10-15   Interval Training   Interval Training   No Yes Yes   Equipment    REL-XR REL-XR   Comments    L4-6 40-70w L4-6 40-70w   Treadmill   MPH  3.2 2.8 2.8 2.8   Grade  2 0 0 0   Minutes  15 10 10 10    NuStep   Level  2 2 2 2    Watts  40 40 40 40   Minutes  15 15 15 15    Recumbant Elliptical   Level   4 4 4    RPM   40 40 40   Watts   40 40 40   Minutes   20 20 20    REL-XR   Level  5 5 5 5    Watts  80 80 80 80   Minutes  10 10 10 10      02/02/15 0800 02/08/15 1000 02/22/15 0600 03/14/15 1000 03/15/15 0700   Exercise Review   Progression No  Multiple PVCs, no increases until hear back from MD No  Multiple PVCs, no increases until hear back from MD Yes Yes Yes   Response to Exercise   Blood Pressure (Admit) 132/64 mmHg  130/82 mmHg  134/82 mmHg   Blood Pressure (Exercise) 148/78 mmHg  148/86 mmHg  154/82 mmHg   Blood Pressure (Exit) 120/68 mmHg  122/64 mmHg  134/86 mmHg   Heart Rate (Admit) 80 bpm  87 bpm  80 bpm   Heart Rate (Exercise) 105 bpm  101 bpm  90 bpm   Heart Rate (Exit) 85 bpm  72 bpm  81 bpm   Rating of Perceived Exertion (Exercise) 13 13 13  12    Symptoms  Multiple PVCs, no symptoms with them. Sent info to MD  Multiple PVCs, no symptoms with them. Sent info to MD None None None   Comments     Discussed adding one day a week of exercise at home in addition to the three days a week at [facility]. Explained that 150 minutes a week of exercise is the goal for combating and managing chronic health conditions. This volume of exercise is also proven to give other health benefits. [name] is usually golfing for exercise outside  of class.   Frequency     Add 2 additional days  to program exercise sessions.   Duration Progress to 30 minutes of continuous aerobic without signs/symptoms of physical distress Progress to 30 minutes of continuous aerobic without signs/symptoms of physical distress Progress to 50 minutes of aerobic without signs/symptoms of physical distress Progress to 50 minutes of aerobic without signs/symptoms of physical distress Progress to 50 minutes of aerobic without signs/symptoms of physical distress   Intensity Rest + 30 Rest + 30 Rest + 30 Rest + 30 Rest + 30   Progression Continue progressive overload as per policy without signs/symptoms or physical distress. Continue progressive overload as per policy without signs/symptoms or physical distress. Continue progressive overload as per policy without signs/symptoms or physical distress. Continue progressive overload as per policy without signs/symptoms or physical distress. Continue progressive overload as per policy without signs/symptoms or physical distress.   Resistance Training   Training Prescription Yes Yes Yes Yes Yes   Weight 8 8 8 8 8    Reps 10-15 10-15 10-15 10-15 10-15   Interval Training   Interval Training Yes Yes Yes Yes Yes   Equipment REL-XR REL-XR REL-XR REL-XR REL-XR   Comments L4-6 40-70w L4-6 40-70w L4-6 40-70w L4-6 40-70w L4-6 40-70w   Treadmill   MPH 2.8 2.8 3.3 3.3 3.3   Grade 0 0 3 2 2    Minutes 10 10 10 10 10    NuStep   Level 2 2 2 2 2    Watts 40 40 40 40 40   Minutes 15 15 15 15 15    Recumbant Elliptical   Level 5 5 5 5 5    RPM 40 40 40 45 45   Watts 40 40 40 40 40   Minutes 20 20 20 20 20    REL-XR   Level 5 5 5 5 5    Watts 80 80 80 80 80   Minutes 10 10 10 10 10      03/21/15 0900           Exercise Review   Progression Yes       Response to Exercise   Symptoms None       Comments Patient is approaching graduation of the program and home exercise plans were discussed. Details of the patient's exercise  prescription and what they need to do in order to continue the prescription and progress with exercise were outlined and the patient verbalized understanding. The patient plans to complete all exercise at [facility] with his wife or will notify us to join the fitness center in the [facility] program       Duration Progress to 50 minutes of aerobic without signs/symptoms of physical distress       Intensity Rest + 30       Progression Continue progressive overload as per policy without signs/symptoms or physical distress.       Resistance Training   Training Prescription Yes       Weight 8       Reps 10-15       Interval Training   Interval Training Yes       Equipment REL-XR       Comments L4-6 40-70w       Treadmill   MPH 3.3       Grade 2       Minutes 10       NuStep   Level 2       Watts 40       Minutes 15       Recumbant Elliptical  Level 5       RPM 45       Watts 40       Minutes 20       REL-XR   Level 5       Watts 80       Minutes 10       Home Exercise Plan   Plans to continue exercise at [facility] (comment)  [facility] or will call about [facility]          Discharge Exercise Prescription (Final Exercise Prescription Changes):     Exercise Prescription Changes - 03/21/15 0900    Exercise Review   Progression Yes   Response to Exercise   Symptoms None   Comments Patient is approaching graduation of the program and home exercise plans were discussed. Details of the patient's exercise prescription and what they need to do in order to continue the prescription and progress with exercise were outlined and the patient verbalized understanding. The patient plans to complete all exercise at [facility] with his wife or will notify us to join the fitness center in the [facility] program   Duration Progress to 50 minutes of aerobic without signs/symptoms of physical distress   Intensity Rest + 30   Progression Continue progressive overload as per policy without  signs/symptoms or physical distress.   Resistance Training   Training Prescription Yes   Weight 8   Reps 10-15   Interval Training   Interval Training Yes   Equipment REL-XR   Comments L4-6 40-70w   Treadmill   MPH 3.3   Grade 2   Minutes 10   NuStep   Level 2   Watts 40   Minutes 15   Recumbant Elliptical   Level 5   RPM 45   Watts 40   Minutes 20   REL-XR   Level 5   Watts 80   Minutes 10   Home Exercise Plan   Plans to continue exercise at [employer] (comment)  [facility] or will call about [facility]      Nutrition:  Target Goals: Understanding of nutrition guidelines, daily intake of sodium 1500mg , cholesterol 200mg , calories 30% from fat and 7% or less from saturated fats, daily to have 5 or more servings of fruits and vegetables.  Biometrics:      Barbie Haggis - 03/21/15 9150     Post  Biometrics   Height 6\' 3"  (1.905 m)   Weight 238 lb (107.956 kg)   Waist Circumference 43.5 inches   Hip Circumference 43 inches   Waist to Hip Ratio 1.01 %   BMI (Calculated) 29.8      Nutrition Therapy Plan and Nutrition Goals:   Nutrition Discharge: Rate Your Plate Scores:   Nutrition Goals Re-Evaluation:     Nutrition Goals Re-Evaluation      11/29/14 0943 12/20/14 1342         Personal Goal #1 Re-Evaluation   Personal Goal #1 Eats healthy already Eats healthy already      Goal Progress Seen Yes Yes      Comments  Continues to work on healthy eating habits.  Discussed steps to help. Looking at food labels, limiting fried foods(which Vineeth does).          Psychosocial: Target Goals: Acknowledge presence or absence of depression, maximize coping skills, provide positive support system. Participant is able to verbalize types and ability to use techniques and skills needed for reducing stress and  depression.  Initial Review & Psychosocial Screening:   Quality of Life Scores:   PHQ-9:     Recent Review Flowsheet Data    Depression  screen [facility] 2/9 03/02/2015 11/29/2014   Decreased Interest 0 0   Down, Depressed, Hopeless 0 0   PHQ - 2 Score 0 0   Altered sleeping 1 -   Tired, decreased energy 1 -   Change in appetite 0 -   Feeling bad or failure about yourself  0 -   Trouble concentrating 0 -   Moving slowly or fidgety/restless 0 -   Suicidal thoughts 0 -   PHQ-9 Score 2 -   Difficult doing work/chores Not difficult at all -      Psychosocial Evaluation and Intervention:   Psychosocial Re-Evaluation:   Vocational Rehabilitation: Provide vocational rehab assistance to qualifying candidates.   Vocational Rehab Evaluation & Intervention:   Education: Education Goals: Education classes will be provided on a weekly basis, covering required topics. Participant will state understanding/return demonstration of topics presented.  Learning Barriers/Preferences:   Education Topics: General Nutrition Guidelines/Fats and Fiber: -Group instruction provided by verbal, written material, models and posters to present the general guidelines for heart healthy nutrition. Gives an explanation and review of dietary fats and fiber.      Cardiac Rehab from 01/05/2015 in [facility]   Date  11/29/14   Educator  C. Joneen Caraway, R   Instruction Review Code  2- meets goals/outcomes      Controlling Sodium/Reading Food Labels: -Group verbal and written material supporting the discussion of sodium use in heart healthy nutrition. Review and explanation with models, verbal and written materials for utilization of the food label.   Exercise Physiology & Risk Factors: - Group verbal and written instruction with models to review the exercise physiology of the cardiovascular system and associated critical values. Details cardiovascular disease risk factors and the goals associated with each risk factor.   Aerobic Exercise & Resistance Training: - Gives group verbal and written discussion on the health impact of inactivity. On  the components of aerobic and resistive training programs and the benefits of this training and how to safely progress through these programs.   Flexibility, Balance, General Exercise Guidelines: - Provides group verbal and written instruction on the benefits of flexibility and balance training programs. Provides general exercise guidelines with specific guidelines to those with heart or lung disease. Demonstration and skill practice provided.   Stress Management: - Provides group verbal and written instruction about the health risks of elevated stress, cause of high stress, and healthy ways to reduce stress.   Depression: - Provides group verbal and written instruction on the correlation between heart/lung disease and depressed mood, treatment options, and the stigmas associated with seeking treatment.   Anatomy & Physiology of the Heart: - Group verbal and written instruction and models provide basic cardiac anatomy and physiology, with the coronary electrical and arterial systems. Review of: AMI, Angina, Valve disease, Heart Failure, Cardiac Arrhythmia, Pacemakers, and the ICD.   Cardiac Procedures: - Group verbal and written instruction and models to describe the testing methods done to diagnose heart disease. Reviews the outcomes of the test results. Describes the treatment choices: Medical Management, Angioplasty, or Coronary Bypass Surgery.   Cardiac Medications: - Group verbal and written instruction to review commonly prescribed medications for heart disease. Reviews the medication, class of the drug, and side effects. Includes the steps to properly store meds and maintain the prescription regimen.  Go Sex-Intimacy & Heart Disease, Get SMART - Goal Setting: - Group verbal and written instruction through game format to discuss heart disease and the return to sexual intimacy. Provides group verbal and written material to discuss and apply goal setting through the application of  the S.M.A.R.T. Method.   Other Matters of the Heart: - Provides group verbal, written materials and models to describe Heart Failure, Angina, Valve Disease, and Diabetes in the realm of heart disease. Includes description of the disease process and treatment options available to the cardiac patient.   Exercise & Equipment Safety: - Individual verbal instruction and demonstration of equipment use and safety with use of the equipment.   Infection Prevention: - Provides verbal and written material to individual with discussion of infection control including proper hand washing and proper equipment cleaning during exercise session.   Falls Prevention: - Provides verbal and written material to individual with discussion of falls prevention and safety.   Diabetes: - Individual verbal and written instruction to review signs/symptoms of diabetes, desired ranges of glucose level fasting, after meals and with exercise. Advice that pre and post exercise glucose checks will be done for 3 sessions at entry of program.    Knowledge Questionnaire Score:     Knowledge Questionnaire Score - 03/02/15 1028    Knowledge Questionnaire Score   Post Score 24/28      Personal Goals and Risk Factors at Admission:     Personal Goals and Risk Factors at Admission - 11/29/14 0944    Personal Goals and Risk Factors on Admission   Increase Aerobic Exercise and Physical Activity Yes   Intervention While in program, learn and follow the exercise prescription taught. Start at a low level workload and increase workload after able to maintain previous level for 30 minutes. Increase time before increasing intensity.   Diabetes Yes   Goal Blood glucose control identified by blood glucose values, HgbA1C. Participant verbalizes understanding of the signs/symptoms of hyper/hypo glycemia, proper foot care and importance of medication and nutrition plan for blood glucose control.      Personal Goals and Risk  Factors Review:      Goals and Risk Factor Review      11/30/14 1114 12/20/14 1344 01/07/15 1312 01/26/15 1054 03/08/15 0740   Weight Management   Goals Progress/Improvement seen No Yes Yes     Comments Weight flucuates 3-4 pounds. Has been out of program for symptoms.  Weight unchaged from admission. HAs been out for medical concerns.  Has noticed some looseness in clothing Continues to notice inches dropping.     Increase Aerobic Exercise and Physical Activity   Goals Progress/Improvement seen  No Yes  Yes Yes   Comments Has been out of program for symptoms. Exercise routine is doing better.  Did review exercising at home. Has a stationary bike and will try to start using it 1 - 2 days a week Doing well with Exercise prescription  Johnathan has gained a lot of cardiovascular endurance and can exercise for 50 minutes in the gym without prolonged rest and can golf 18 holes. He is regularly golfing during the week and doing tournaments and is very excited to be back on the golf course and encoruaged by his progress to get there.    Diabetes   Goal Blood glucose control identified by blood glucose values, HgbA1C. Participant verbalizes understanding of the signs/symptoms of hyper/hypo glycemia, proper foot care and importance of medication and nutrition plan for blood glucose control.  Blood glucose control identified by blood glucose values, HgbA1C. Participant verbalizes understanding of the signs/symptoms of hyper/hypo glycemia, proper foot care and importance of medication and nutrition plan for blood glucose control.    Progress seen towards goals Unknown Yes Yes Yes    Comments no comments provided  FBS 120-140  Hopes to see HgbA1C results lower.     Hypertension   Goal Participant will see blood pressure controlled within the values of 140/73mm/Hg or within value directed by their physician.       Progress seen toward goals Yes Yes   Yes   Comments BP within normal ranges BP within normal  ranges   Robbert's post exercise blood pressures have been excellent. Sometimes    Abnormal Lipids   Progress seen towards goals Unknown Unknown   No   Comments No recent lab results to compare No recent lab results to compare   No blood testing completed to update this       Personal Goals Discharge (Final Personal Goals and Risk Factors Review):      Goals and Risk Factor Review - 03/08/15 0740    Increase Aerobic Exercise and Physical Activity   Goals Progress/Improvement seen  Yes   Comments Erasto has gained a lot of cardiovascular endurance and can exercise for 50 minutes in the gym without prolonged rest and can golf 18 holes. He is regularly golfing during the week and doing tournaments and is very excited to be back on the golf course and encoruaged by his progress to get there.    Hypertension   Progress seen toward goals Yes   Comments Aldyn's post exercise blood pressures have been excellent. Sometimes    Abnormal Lipids   Progress seen towards goals No   Comments No blood testing completed to update this        Comments: Jerron is glad he is healthy enough again to golf. Has done well in Cardiac Rehab.

## 2015-03-25 DIAGNOSIS — Z9861 Coronary angioplasty status: Secondary | ICD-10-CM | POA: Diagnosis not present

## 2015-03-25 NOTE — Patient Instructions (Signed)
Patient Instructions  Patient Details  Name: Brandon Wall MRN: [id number] Date of Birth: 1941-03-30 Referring Provider:  [name], MD  Below are the personal goals you chose as well as exercise and nutrition goals. Our goal is to help you keep on track towards obtaining and maintaining your goals. We will be discussing your progress on these goals with you throughout the program.  Initial Exercise Prescription:   Exercise Goals: Frequency: Be able to perform aerobic exercise three times per week working toward 3-5 days per week.  Intensity: Work with a perceived exertion of 11 (fairly light) - 15 (hard) as tolerated. Follow your new exercise prescription and watch for changes in prescription as you progress with the program. Changes will be reviewed with you when they are made.  Duration: You should be able to do 30 minutes of continuous aerobic exercise in addition to a 5 minute warm-up and a 5 minute cool-down routine.  Nutrition Goals: Your personal nutrition goals will be established when you do your nutrition analysis with the dietician.  The following are nutrition guidelines to follow: Cholesterol < 200mg /day Sodium < 1500mg /day Fiber: Men over 50 yrs - 30 grams per day  Personal Goals:     Personal Goals and Risk Factors at Admission - 11/29/14 0944    Personal Goals and Risk Factors on Admission   Increase Aerobic Exercise and Physical Activity Yes   Intervention While in program, learn and follow the exercise prescription taught. Start at a low level workload and increase workload after able to maintain previous level for 30 minutes. Increase time before increasing intensity.   Diabetes Yes   Goal Blood glucose control identified by blood glucose values, HgbA1C. Participant verbalizes understanding of the signs/symptoms of hyper/hypo glycemia, proper foot care and importance of medication and nutrition plan for blood glucose control.      Tobacco Use Initial  Evaluation: History  Smoking status  . Not on file  Smokeless tobacco  . Not on file    Copy of goals given to participant.

## 2015-03-25 NOTE — Progress Notes (Signed)
Discharge Summary  Patient Details  Name: Brandon Wall MRN: [id number] Date of Birth: 26-Jun-1940 Referring Provider:  [name], MD   Number of Visits: 66 will complete 36 sessions next week.   Reason for Discharge:  Patient reached a stable level of exercise. Patient independent in their exercise.  Smoking History:  History  Smoking status  . Not on file  Smokeless tobacco  . Not on file    Diagnosis:  S/P PTCA (percutaneous transluminal coronary angioplasty)  ADL UCSD:   Initial Exercise Prescription:   Discharge Exercise Prescription (Final Exercise Prescription Changes):     Exercise Prescription Changes - 03/21/15 0900    Exercise Review   Progression Yes   Response to Exercise   Symptoms None   Comments Patient is approaching graduation of the program and home exercise plans were discussed. Details of the patient's exercise prescription and what they need to do in order to continue the prescription and progress with exercise were outlined and the patient verbalized understanding. The patient plans to complete all exercise at [facility] with his wife or will notify us to join the fitness center in the [employer] program   Duration Progress to 50 minutes of aerobic without signs/symptoms of physical distress   Intensity Rest + 30   Progression Continue progressive overload as per policy without signs/symptoms or physical distress.   Resistance Training   Training Prescription Yes   Weight 8   Reps 10-15   Interval Training   Interval Training Yes   Equipment REL-XR   Comments L4-6 40-70w   Treadmill   MPH 3.3   Grade 2   Minutes 10   NuStep   Level 2   Watts 40   Minutes 15   Recumbant Elliptical   Level 5   RPM 45   Watts 40   Minutes 20   REL-XR   Level 5   Watts 80   Minutes 10   Home Exercise Plan   Plans to continue exercise at [employer] (comment)  [facility] or will call about [employer]      Functional Capacity:      6 Minute Walk      03/21/15 0908       6 Minute Walk   Phase Discharge     Distance 1670 feet     Distance % Change 5 %     Walk Time 6 minutes     Resting HR 69 bpm     Resting BP 134/80 mmHg     Max Ex. HR 101 bpm     Max Ex. BP 160/72 mmHg     RPE 12     Symptoms No        Psychological, QOL, Others - Outcomes: PHQ 2/9: Depression screen [facility] 2/9 03/02/2015 11/29/2014  Decreased Interest 0 0  Down, Depressed, Hopeless 0 0  PHQ - 2 Score 0 0  Altered sleeping 1 -  Tired, decreased energy 1 -  Change in appetite 0 -  Feeling bad or failure about yourself  0 -  Trouble concentrating 0 -  Moving slowly or fidgety/restless 0 -  Suicidal thoughts 0 -  PHQ-9 Score 2 -  Difficult doing work/chores Not difficult at all -    Quality of Life:   Personal Goals: Goals established at orientation with interventions provided to work toward goal.     Personal Goals and Risk Factors at Admission - 11/29/14 0944    Personal Goals  and Risk Factors on Admission   Increase Aerobic Exercise and Physical Activity Yes   Intervention While in program, learn and follow the exercise prescription taught. Start at a low level workload and increase workload after able to maintain previous level for 30 minutes. Increase time before increasing intensity.   Diabetes Yes   Goal Blood glucose control identified by blood glucose values, HgbA1C. Participant verbalizes understanding of the signs/symptoms of hyper/hypo glycemia, proper foot care and importance of medication and nutrition plan for blood glucose control.       Personal Goals Discharge:     Goals and Risk Factor Review      11/30/14 1114 12/20/14 1344 01/07/15 1312 01/26/15 1054 03/08/15 0740   Weight Management   Goals Progress/Improvement seen No Yes Yes     Comments Weight flucuates 3-4 pounds. Has been out of program for symptoms.  Weight unchaged from admission. HAs been out for medical concerns.  Has noticed some looseness in  clothing Continues to notice inches dropping.     Increase Aerobic Exercise and Physical Activity   Goals Progress/Improvement seen  No Yes  Yes Yes   Comments Has been out of program for symptoms. Exercise routine is doing better.  Did review exercising at home. Has a stationary bike and will try to start using it 1 - 2 days a week Doing well with Exercise prescription  Thi has gained a lot of cardiovascular endurance and can exercise for 50 minutes in the gym without prolonged rest and can golf 18 holes. He is regularly golfing during the week and doing tournaments and is very excited to be back on the golf course and encoruaged by his progress to get there.    Diabetes   Goal Blood glucose control identified by blood glucose values, HgbA1C. Participant verbalizes understanding of the signs/symptoms of hyper/hypo glycemia, proper foot care and importance of medication and nutrition plan for blood glucose control.   Blood glucose control identified by blood glucose values, HgbA1C. Participant verbalizes understanding of the signs/symptoms of hyper/hypo glycemia, proper foot care and importance of medication and nutrition plan for blood glucose control.    Progress seen towards goals Unknown Yes Yes Yes    Comments no comments provided  FBS 120-140  Hopes to see HgbA1C results lower.     Hypertension   Goal Participant will see blood pressure controlled within the values of 140/64mm/Hg or within value directed by their physician.       Progress seen toward goals Yes Yes   Yes   Comments BP within normal ranges BP within normal ranges   [facility] post exercise blood pressures have been excellent. Sometimes    Abnormal Lipids   Progress seen towards goals Unknown Unknown   No   Comments No recent lab results to compare No recent lab results to compare   No blood testing completed to update this      03/25/15 0936           Increase Aerobic Exercise and Physical Activity   Goals  Progress/Improvement seen  Yes       Comments Patient plays golf several days per week and walks part of the course while playing. He is also doing very well with his exercise prescription. He is aggresive on the equipment and works hard in class.           Nutrition & Weight - Outcomes:      Post Biometrics - 03/21/15 9485  Post  Biometrics   Height 6\' 3"  (1.905 m)   Weight 238 lb (107.956 kg)   Waist Circumference 43.5 inches   Hip Circumference 43 inches   Waist to Hip Ratio 1.01 %   BMI (Calculated) 29.8      Nutrition:   Nutrition Discharge:   Education Questionnaire Score:     Knowledge Questionnaire Score - 03/02/15 1028    Knowledge Questionnaire Score   Post Score 24/28      Goals reviewed with patient; copy given to patient.

## 2015-03-25 NOTE — Progress Notes (Signed)
Cardiac Individual Treatment Plan  Patient Details  Name: Brandon Wall MRN: [id number] Date of Birth: 10-05-1940 Referring Provider:  [name], MD  Initial Encounter Date:    Visit Diagnosis: S/P PTCA (percutaneous transluminal coronary angioplasty)  Patient's Home Medications on Admission:  Current outpatient prescriptions:  .  apixaban (ELIQUIS) 5 MG TABS tablet, TAKE 1 TABLET BY MOUTH TWICE DAILY, Disp: , Rfl:  .  clopidogrel (PLAVIX) 75 MG tablet, Take by mouth., Disp: , Rfl:  .  glipiZIDE (GLUCOTROL) 5 MG tablet, Take by mouth., Disp: , Rfl:  .  glucose blood test strip, , Disp: , Rfl:  .  isosorbide mononitrate (IMDUR) 30 MG 24 hr tablet, Take by mouth., Disp: , Rfl:  .  ketoconazole (NIZORAL) 2 % cream, , Disp: , Rfl:  .  losartan (COZAAR) 100 MG tablet, TAKE 1 TABLET BY MOUTH EVERY DAY, Disp: , Rfl:  .  metFORMIN (GLUCOPHAGE) 1000 MG tablet, Take by mouth., Disp: , Rfl:  .  metoprolol succinate (TOPROL-XL) 100 MG 24 hr tablet, Take by mouth., Disp: , Rfl:  .  nitroGLYCERIN (NITROSTAT) 0.4 MG SL tablet, Place under the tongue., Disp: , Rfl:  .  rosuvastatin (CRESTOR) 40 MG tablet, Take by mouth., Disp: , Rfl:   Past Medical History: No past medical history on file.  Tobacco Use: History  Smoking status  . Not on file  Smokeless tobacco  . Not on file    Labs: Recent Review Flowsheet Data    There is no flowsheet data to display.       Exercise Target Goals:    Exercise Program Goal: Individual exercise prescription set with THRR, safety & activity barriers. Participant demonstrates ability to understand and report RPE using BORG scale, to self-measure pulse accurately, and to acknowledge the importance of the exercise prescription.  Exercise Prescription Goal: Starting with aerobic activity 30 plus minutes a day, 3 days per week for initial exercise prescription. Provide home exercise prescription and guidelines that participant acknowledges  understanding prior to discharge.  Activity Barriers & Risk Stratification:   6 Minute Walk:     6 Minute Walk      03/21/15 0908       6 Minute Walk   Phase Discharge     Distance 1670 feet     Distance % Change 5 %     Walk Time 6 minutes     Resting HR 69 bpm     Resting BP 134/80 mmHg     Max Ex. HR 101 bpm     Max Ex. BP 160/72 mmHg     RPE 12     Symptoms No        Initial Exercise Prescription:   Exercise Prescription Changes:     Exercise Prescription Changes      11/29/14 1600 11/30/14 1100 12/14/14 0700 12/27/14 1200 01/18/15 0700   Exercise Review   Progression No  Returned 11/29/14, easing back into previous workloads  No  Working slowly back to previous workloads No  Working slowly back to previous workloads No  Multiple PVCs, no increases until hear back from MD   Response to Exercise   Blood Pressure (Admit)  126/70 mmHg 110/68 mmHg 110/68 mmHg 132/64 mmHg   Blood Pressure (Exercise)  130/80 mmHg 130/62 mmHg 130/62 mmHg 148/78 mmHg   Blood Pressure (Exit)  122/70 mmHg 100/64 mmHg 100/64 mmHg 120/68 mmHg   Heart Rate (Admit)  73 bpm 76 bpm 76 bpm 80 bpm  Heart Rate (Exercise)  102 bpm 84 bpm 84 bpm 105 bpm   Heart Rate (Exit)  62 bpm 72 bpm 72 bpm 85 bpm   Rating of Perceived Exertion (Exercise)  11 12 12 13    Symptoms   No No  Multiple PVCs, no symptoms with them. Sent info to MD   Duration  Progress to 30 minutes of continuous aerobic without signs/symptoms of physical distress Progress to 30 minutes of continuous aerobic without signs/symptoms of physical distress Progress to 30 minutes of continuous aerobic without signs/symptoms of physical distress Progress to 30 minutes of continuous aerobic without signs/symptoms of physical distress   Intensity  Rest + 30 Rest + 30 Rest + 30 Rest + 30   Progression  Continue progressive overload as per policy without signs/symptoms or physical distress. Continue progressive overload as per policy without  signs/symptoms or physical distress. Continue progressive overload as per policy without signs/symptoms or physical distress. Continue progressive overload as per policy without signs/symptoms or physical distress.   Resistance Training   Training Prescription  Yes Yes Yes Yes   Weight  body weight body weight body weight 8   Reps  10-12 10-15 10-15 10-15   Interval Training   Interval Training   No Yes Yes   Equipment    REL-XR REL-XR   Comments    L4-6 40-70w L4-6 40-70w   Treadmill   MPH  3.2 2.8 2.8 2.8   Grade  2 0 0 0   Minutes  15 10 10 10    NuStep   Level  2 2 2 2    Watts  40 40 40 40   Minutes  15 15 15 15    Recumbant Elliptical   Level   4 4 4    RPM   40 40 40   Watts   40 40 40   Minutes   20 20 20    REL-XR   Level  5 5 5 5    Watts  80 80 80 80   Minutes  10 10 10 10      02/02/15 0800 02/08/15 1000 02/22/15 0600 03/14/15 1000 03/15/15 0700   Exercise Review   Progression No  Multiple PVCs, no increases until hear back from MD No  Multiple PVCs, no increases until hear back from MD Yes Yes Yes   Response to Exercise   Blood Pressure (Admit) 132/64 mmHg  130/82 mmHg  134/82 mmHg   Blood Pressure (Exercise) 148/78 mmHg  148/86 mmHg  154/82 mmHg   Blood Pressure (Exit) 120/68 mmHg  122/64 mmHg  134/86 mmHg   Heart Rate (Admit) 80 bpm  87 bpm  80 bpm   Heart Rate (Exercise) 105 bpm  101 bpm  90 bpm   Heart Rate (Exit) 85 bpm  72 bpm  81 bpm   Rating of Perceived Exertion (Exercise) 13 13 13  12    Symptoms  Multiple PVCs, no symptoms with them. Sent info to MD  Multiple PVCs, no symptoms with them. Sent info to MD None None None   Comments     Discussed adding one day a week of exercise at home in addition to the three days a week at [facility]. Explained that 150 minutes a week of exercise is the goal for combating and managing chronic health conditions. This volume of exercise is also proven to give other health benefits. Brandon Wall is usually golfing for exercise outside  of class.   Frequency     Add 2 additional days  to program exercise sessions.   Duration Progress to 30 minutes of continuous aerobic without signs/symptoms of physical distress Progress to 30 minutes of continuous aerobic without signs/symptoms of physical distress Progress to 50 minutes of aerobic without signs/symptoms of physical distress Progress to 50 minutes of aerobic without signs/symptoms of physical distress Progress to 50 minutes of aerobic without signs/symptoms of physical distress   Intensity Rest + 30 Rest + 30 Rest + 30 Rest + 30 Rest + 30   Progression Continue progressive overload as per policy without signs/symptoms or physical distress. Continue progressive overload as per policy without signs/symptoms or physical distress. Continue progressive overload as per policy without signs/symptoms or physical distress. Continue progressive overload as per policy without signs/symptoms or physical distress. Continue progressive overload as per policy without signs/symptoms or physical distress.   Resistance Training   Training Prescription Yes Yes Yes Yes Yes   Weight 8 8 8 8 8    Reps 10-15 10-15 10-15 10-15 10-15   Interval Training   Interval Training Yes Yes Yes Yes Yes   Equipment REL-XR REL-XR REL-XR REL-XR REL-XR   Comments L4-6 40-70w L4-6 40-70w L4-6 40-70w L4-6 40-70w L4-6 40-70w   Treadmill   MPH 2.8 2.8 3.3 3.3 3.3   Grade 0 0 3 2 2    Minutes 10 10 10 10 10    NuStep   Level 2 2 2 2 2    Watts 40 40 40 40 40   Minutes 15 15 15 15 15    Recumbant Elliptical   Level 5 5 5 5 5    RPM 40 40 40 45 45   Watts 40 40 40 40 40   Minutes 20 20 20 20 20    REL-XR   Level 5 5 5 5 5    Watts 80 80 80 80 80   Minutes 10 10 10 10 10      03/21/15 0900           Exercise Review   Progression Yes       Response to Exercise   Symptoms None       Comments Patient is approaching graduation of the program and home exercise plans were discussed. Details of the patient's exercise  prescription and what they need to do in order to continue the prescription and progress with exercise were outlined and the patient verbalized understanding. The patient plans to complete all exercise at [facility] with his wife or will notify us to join the fitness center in the [employer] program       Duration Progress to 50 minutes of aerobic without signs/symptoms of physical distress       Intensity Rest + 30       Progression Continue progressive overload as per policy without signs/symptoms or physical distress.       Resistance Training   Training Prescription Yes       Weight 8       Reps 10-15       Interval Training   Interval Training Yes       Equipment REL-XR       Comments L4-6 40-70w       Treadmill   MPH 3.3       Grade 2       Minutes 10       NuStep   Level 2       Watts 40       Minutes 15       Recumbant Elliptical  Level 5       RPM 45       Watts 40       Minutes 20       REL-XR   Level 5       Watts 80       Minutes 10       Home Exercise Plan   Plans to continue exercise at [facility] (comment)  [facility] or will call about [facility] [employer]          Discharge Exercise Prescription (Final Exercise Prescription Changes):     Exercise Prescription Changes - 03/21/15 0900    Exercise Review   Progression Yes   Response to Exercise   Symptoms None   Comments Patient is approaching graduation of the program and home exercise plans were discussed. Details of the patient's exercise prescription and what they need to do in order to continue the prescription and progress with exercise were outlined and the patient verbalized understanding. The patient plans to complete all exercise at [facility] with his wife or will notify us to join the fitness center in the [employer] program   Duration Progress to 50 minutes of aerobic without signs/symptoms of physical distress   Intensity Rest + 30   Progression Continue progressive overload as per policy without  signs/symptoms or physical distress.   Resistance Training   Training Prescription Yes   Weight 8   Reps 10-15   Interval Training   Interval Training Yes   Equipment REL-XR   Comments L4-6 40-70w   Treadmill   MPH 3.3   Grade 2   Minutes 10   NuStep   Level 2   Watts 40   Minutes 15   Recumbant Elliptical   Level 5   RPM 45   Watts 40   Minutes 20   REL-XR   Level 5   Watts 80   Minutes 10   Home Exercise Plan   Plans to continue exercise at [employer] (comment)  [facility] or will call about [facility] [employer]      Nutrition:  Target Goals: Understanding of nutrition guidelines, daily intake of sodium 1500mg , cholesterol 200mg , calories 30% from fat and 7% or less from saturated fats, daily to have 5 or more servings of fruits and vegetables.  Biometrics:      Barbie Haggis - 03/21/15 9163     Post  Biometrics   Height 6\' 3"  (1.905 m)   Weight 238 lb (107.956 kg)   Waist Circumference 43.5 inches   Hip Circumference 43 inches   Waist to Hip Ratio 1.01 %   BMI (Calculated) 29.8      Nutrition Therapy Plan and Nutrition Goals:   Nutrition Discharge: Rate Your Plate Scores:   Nutrition Goals Re-Evaluation:     Nutrition Goals Re-Evaluation      11/29/14 0943 12/20/14 1342 03/25/15 1001       Personal Goal #1 Re-Evaluation   Personal Goal #1 Eats healthy already Eats healthy already      Goal Progress Seen Yes Yes Yes     Comments  Continues to work on healthy eating habits.  Discussed steps to help. Looking at food labels, limiting fried foods(which Brandon Wall does).          Psychosocial: Target Goals: Acknowledge presence or absence of depression, maximize coping skills, provide positive support system. Participant is able to verbalize types and ability to use techniques and skills needed for reducing stress and  depression.  Initial Review & Psychosocial Screening:   Quality of Life Scores:   PHQ-9:     Recent Review Flowsheet Data     Depression screen [facility] 2/9 03/02/2015 11/29/2014   Decreased Interest 0 0   Down, Depressed, Hopeless 0 0   PHQ - 2 Score 0 0   Altered sleeping 1 -   Tired, decreased energy 1 -   Change in appetite 0 -   Feeling bad or failure about yourself  0 -   Trouble concentrating 0 -   Moving slowly or fidgety/restless 0 -   Suicidal thoughts 0 -   PHQ-9 Score 2 -   Difficult doing work/chores Not difficult at all -      Psychosocial Evaluation and Intervention:   Psychosocial Re-Evaluation:     Psychosocial Re-Evaluation      03/25/15 1002           Psychosocial Re-Evaluation   Interventions Encouraged to attend Cardiac Rehabilitation for the exercise       Comments Brandon Wall plays golf for stress relief . 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           Vocational Rehabilitation: Provide vocational rehab assistance to qualifying candidates.   Vocational Rehab Evaluation & Intervention:   Education: Education Goals: Education classes will be provided on a weekly basis, covering required topics. Participant will state understanding/return demonstration of topics presented.  Learning Barriers/Preferences:   Education Topics: General Nutrition Guidelines/Fats and Fiber: -Group instruction provided by verbal, written material, models and posters to present the general guidelines for heart healthy nutrition. Gives an explanation and review of dietary fats and fiber.          Cardiac Rehab from 01/05/2015 in [facility]   Date  11/29/14   Educator  C. Joneen Caraway, R   Instruction Review Code  2- meets goals/outcomes      Controlling Sodium/Reading Food Labels: -Group verbal and written material supporting the discussion of sodium use in heart healthy nutrition. Review and explanation with models, verbal and written materials for utilization of the food  label.   Exercise Physiology & Risk Factors: - Group verbal and written instruction with models to review the exercise physiology of the cardiovascular system and associated critical values. Details cardiovascular disease risk factors and the goals associated with each risk factor.   Aerobic Exercise & Resistance Training: - Gives group verbal and written discussion on the health impact of inactivity. On the components of aerobic and resistive training programs and the benefits of this training and how to safely progress through these programs.   Flexibility, Balance, General Exercise Guidelines: - Provides group verbal and written instruction on the benefits of flexibility and balance training programs. Provides general exercise guidelines with specific guidelines to those with heart or lung disease. Demonstration and skill practice provided.   Stress Management: - Provides group verbal and written instruction about the health risks of elevated stress, cause of high stress, and healthy ways to reduce stress.   Depression: - Provides group verbal and written instruction on the correlation between heart/lung disease and depressed mood, treatment options, and the stigmas associated with seeking treatment.   Anatomy & Physiology of the Heart: - Group verbal and written instruction and models provide basic cardiac anatomy and physiology, with the coronary electrical and arterial systems. Review of: AMI, Angina, Valve disease, Heart Failure, Cardiac Arrhythmia,  Pacemakers, and the ICD.   Cardiac Procedures: - Group verbal and written instruction and models to describe the testing methods done to diagnose heart disease. Reviews the outcomes of the test results. Describes the treatment choices: Medical Management, Angioplasty, or Coronary Bypass Surgery.   Cardiac Medications: - Group verbal and written instruction to review commonly prescribed medications for heart disease. Reviews the  medication, class of the drug, and side effects. Includes the steps to properly store meds and maintain the prescription regimen.   Go Sex-Intimacy & Heart Disease, Get SMART - Goal Setting: - Group verbal and written instruction through game format to discuss heart disease and the return to sexual intimacy. Provides group verbal and written material to discuss and apply goal setting through the application of the S.M.A.R.T. Method.   Other Matters of the Heart: - Provides group verbal, written materials and models to describe Heart Failure, Angina, Valve Disease, and Diabetes in the realm of heart disease. Includes description of the disease process and treatment options available to the cardiac patient.   Exercise & Equipment Safety: - Individual verbal instruction and demonstration of equipment use and safety with use of the equipment.   Infection Prevention: - Provides verbal and written material to individual with discussion of infection control including proper hand washing and proper equipment cleaning during exercise session.   Falls Prevention: - Provides verbal and written material to individual with discussion of falls prevention and safety.   Diabetes: - Individual verbal and written instruction to review signs/symptoms of diabetes, desired ranges of glucose level fasting, after meals and with exercise. Advice that pre and post exercise glucose checks will be done for 3 sessions at entry of program.    Knowledge Questionnaire Score:     Knowledge Questionnaire Score - 03/02/15 1028    Knowledge Questionnaire Score   Post Score 24/28      Personal Goals and Risk Factors at Admission:     Personal Goals and Risk Factors at Admission - 11/29/14 0944    Personal Goals and Risk Factors on Admission   Increase Aerobic Exercise and Physical Activity Yes   Intervention While in program, learn and follow the exercise prescription taught. Start at a low level workload and  increase workload after able to maintain previous level for 30 minutes. Increase time before increasing intensity.   Diabetes Yes   Goal Blood glucose control identified by blood glucose values, HgbA1C. Participant verbalizes understanding of the signs/symptoms of hyper/hypo glycemia, proper foot care and importance of medication and nutrition plan for blood glucose control.      Personal Goals and Risk Factors Review:      Goals and Risk Factor Review      11/30/14 1114 12/20/14 1344 01/07/15 1312 01/26/15 1054 03/08/15 0740   Weight Management   Goals Progress/Improvement seen No Yes Yes     Comments Weight flucuates 3-4 pounds. Has been out of program for symptoms.  Weight unchaged from admission. HAs been out for medical concerns.  Has noticed some looseness in clothing Continues to notice inches dropping.     Increase Aerobic Exercise and Physical Activity   Goals Progress/Improvement seen  No Yes  Yes Yes   Comments Has been out of program for symptoms. Exercise routine is doing better.  Did review exercising at home. Has a stationary bike and will try to start using it 1 - 2 days a week Doing well with Exercise prescription  Brandon Wall has gained a lot of cardiovascular endurance and  can exercise for 50 minutes in the gym without prolonged rest and can golf 18 holes. He is regularly golfing during the week and doing tournaments and is very excited to be back on the golf course and encoruaged by his progress to get there.    Diabetes   Goal Blood glucose control identified by blood glucose values, HgbA1C. Participant verbalizes understanding of the signs/symptoms of hyper/hypo glycemia, proper foot care and importance of medication and nutrition plan for blood glucose control.   Blood glucose control identified by blood glucose values, HgbA1C. Participant verbalizes understanding of the signs/symptoms of hyper/hypo glycemia, proper foot care and importance of medication and nutrition plan for  blood glucose control.    Progress seen towards goals Unknown Yes Yes Yes    Comments no comments provided  FBS 120-140  Hopes to see HgbA1C results lower.     Hypertension   Goal Participant will see blood pressure controlled within the values of 140/76mm/Hg or within value directed by their physician.       Progress seen toward goals Yes Yes   Yes   Comments BP within normal ranges BP within normal ranges   Brandon Wall's post exercise blood pressures have been excellent. Sometimes    Abnormal Lipids   Progress seen towards goals Unknown Unknown   No   Comments No recent lab results to compare No recent lab results to compare   No blood testing completed to update this      03/25/15 0936           Increase Aerobic Exercise and Physical Activity   Goals Progress/Improvement seen  Yes       Comments Patient plays golf several days per week and walks part of the course while playing. He is also doing very well with his exercise prescription. He is aggresive on the equipment and works hard in class.           Personal Goals Discharge (Final Personal Goals and Risk Factors Review):      Goals and Risk Factor Review - 03/25/15 0936    Increase Aerobic Exercise and Physical Activity   Goals Progress/Improvement seen  Yes   Comments Patient plays golf several days per week and walks part of the course while playing. He is also doing very well with his exercise prescription. He is aggresive on the equipment and works hard in class.        Comments: Brandon Wall has completed 34/36 sessions of Cardiac Rehab.

## 2015-03-25 NOTE — Progress Notes (Signed)
Daily Session Note  Patient Details  Name: HANLEY WOERNER MRN: [id number] Date of Birth: 07-Sep-1940 Referring Provider:  [name], MD  Encounter Date: 03/25/2015  Check In:     Session Check In - 03/25/15 0933    Check-In   Staff Present Candiss Norse MS, ACSM CEP Exercise Physiologist;Other;Carroll Enterkin RN, BSN   ER physicians immediately available to respond to emergencies See telemetry face sheet for immediately available ER MD   Medication changes reported     No   Fall or balance concerns reported    No   Warm-up and Cool-down Performed on first and last piece of equipment   VAD Patient? No   Pain Assessment   Currently in Pain? No/denies         Goals Met:  Independence with exercise equipment Exercise tolerated well Personal goals reviewed No report of cardiac concerns or symptoms Strength training completed today  Goals Unmet:  Not Applicable  Goals Comments:    Dr. Emily Filbert is Medical Director for [facility] and [name].

## 2015-03-25 NOTE — Patient Instructions (Signed)
Discharge Instructions  Patient Details  Name: Brandon Wall MRN: [id number] Date of Birth: [birth year] Referring Provider:  No ref. provider found   Number of Visits: 34 will complete 36 next week  Reason for Discharge:  Patient reached a stable level of exercise. Patient independent in their exercise.  Smoking History:  History  Smoking status  . Not on file  Smokeless tobacco  . Not on file    Diagnosis:  S/P PTCA (percutaneous transluminal coronary angioplasty) - Plan: CARDIAC REHAB 30 DAY REVIEW  Initial Exercise Prescription:   Discharge Exercise Prescription (Final Exercise Prescription Changes):     Exercise Prescription Changes - 03/21/15 0900    Exercise Review   Progression Yes   Response to Exercise   Symptoms None   Comments Patient is approaching graduation of the program and home exercise plans were discussed. Details of the patient's exercise prescription and what they need to do in order to continue the prescription and progress with exercise were outlined and the patient verbalized understanding. The patient plans to complete all exercise at [facility] with his wife or will notify us to join the fitness center in the [employer] program   Duration Progress to 50 minutes of aerobic without signs/symptoms of physical distress   Intensity Rest + 30   Progression Continue progressive overload as per policy without signs/symptoms or physical distress.   Resistance Training   Training Prescription Yes   Weight 8   Reps 10-15   Interval Training   Interval Training Yes   Equipment REL-XR   Comments L4-6 40-70w   Treadmill   MPH 3.3   Grade 2   Minutes 10   NuStep   Level 2   Watts 40   Minutes 15   Recumbant Elliptical   Level 5   RPM 45   Watts 40   Minutes 20   REL-XR   Level 5   Watts 80   Minutes 10   Home Exercise Plan   Plans to continue exercise at [employer] (comment)  [facility] or will call about [employer] [employer]       Functional Capacity:     6 Minute Walk      03/21/15 0908       6 Minute Walk   Phase Discharge     Distance 1670 feet     Distance % Change 5 %     Walk Time 6 minutes     Resting HR 69 bpm     Resting BP 134/80 mmHg     Max Ex. HR 101 bpm     Max Ex. BP 160/72 mmHg     RPE 12     Symptoms No        Quality of Life:   Personal Goals: Goals established at orientation with interventions provided to work toward goal.     Personal Goals and Risk Factors at Admission - 11/29/14 0944    Personal Goals and Risk Factors on Admission   Increase Aerobic Exercise and Physical Activity Yes   Intervention While in program, learn and follow the exercise prescription taught. Start at a low level workload and increase workload after able to maintain previous level for 30 minutes. Increase time before increasing intensity.   Diabetes Yes   Goal Blood glucose control identified by blood glucose values, HgbA1C. Participant verbalizes understanding of the signs/symptoms of hyper/hypo glycemia, proper foot care and importance of medication and nutrition plan for blood glucose control.  Personal Goals Discharge:     Goals and Risk Factor Review - 03/25/15 0936    Increase Aerobic Exercise and Physical Activity   Goals Progress/Improvement seen  Yes   Comments Patient plays golf several days per week and walks part of the course while playing. He is also doing very well with his exercise prescription. He is aggresive on the equipment and works hard in class.       Nutrition & Weight - Outcomes:      Post Biometrics - 03/21/15 0907     Post  Biometrics   Height 6\' 3"  (1.905 m)   Weight 238 lb (107.956 kg)   Waist Circumference 43.5 inches   Hip Circumference 43 inches   Waist to Hip Ratio 1.01 %   BMI (Calculated) 29.8      Nutrition:   Nutrition Discharge:   Education Questionnaire Score:     Knowledge Questionnaire Score - 03/02/15 1028    Knowledge  Questionnaire Score   Post Score 24/28      Goals reviewed with patient; copy given to patient.

## 2015-04-01 ENCOUNTER — Encounter: Payer: Medicare Other | Admitting: *Deleted

## 2015-04-01 DIAGNOSIS — Z9861 Coronary angioplasty status: Secondary | ICD-10-CM

## 2015-04-01 NOTE — Progress Notes (Signed)
Daily Session Note  Patient Details  Name: Brandon Wall MRN: [id number] Date of Birth: 04/26/1941 Referring Provider:  [name], MD  Encounter Date: 04/01/2015  Check In:     Session Check In - 04/01/15 0930    Check-In   Staff Present Candiss Norse MS, ACSM CEP Exercise Physiologist;Carroll Enterkin RN, BSN;Susanne Bice RN, BSN, [place]   ER physicians immediately available to respond to emergencies See telemetry face sheet for immediately available ER MD   Medication changes reported     No   Fall or balance concerns reported    No   Warm-up and Cool-down Performed on first and last piece of equipment   VAD Patient? No   Pain Assessment   Currently in Pain? No/denies   Multiple Pain Sites No         Goals Met:  Independence with exercise equipment Exercise tolerated well No report of cardiac concerns or symptoms Strength training completed today  Goals Unmet:  Not Applicable  Goals Comments: Patient completed exercise prescription and all exercise goals during rehab session. The exercise was tolerated well and the patient is progressing in the program.    Dr. Emily Filbert is Medical Director for [name].

## 2015-04-04 ENCOUNTER — Encounter: Payer: Medicare Other | Admitting: *Deleted

## 2015-04-04 DIAGNOSIS — Z9861 Coronary angioplasty status: Secondary | ICD-10-CM | POA: Diagnosis not present

## 2015-04-04 NOTE — Progress Notes (Signed)
Daily Session Note  Patient Details  Name: [name] MRN: [id number] Date of Birth: 1940-10-19 Referring Provider:  [name], MD  Encounter Date: 04/04/2015  Check In:     Session Check In - 04/04/15 0853    Check-In   Staff Present Candiss Norse MS, ACSM CEP Exercise Physiologist;Susanne Bice RN, BSN, CCRP;Kobey Sides Alfonso Patten, ACSM CEP Exercise Physiologist   ER physicians immediately available to respond to emergencies See telemetry face sheet for immediately available ER MD   Medication changes reported     No   Fall or balance concerns reported    No   Warm-up and Cool-down Performed on first and last piece of equipment   VAD Patient? No   Pain Assessment   Currently in Pain? No/denies   Multiple Pain Sites No         Goals Met:  Independence with exercise equipment Exercise tolerated well No report of cardiac concerns or symptoms Strength training completed today  Goals Unmet:  Not Applicable  Goals Comments: Patient completed exercise prescription and all exercise goals during rehab session. The exercise was tolerated well and the patient is progressing in the program.     Dr. Emily Filbert is Medical Director for [place] [name].

## 2015-04-11 ENCOUNTER — Encounter: Payer: Self-pay | Admitting: *Deleted

## 2015-04-11 NOTE — Progress Notes (Signed)
Discharge Summary  Patient Details  Name: Brandon Wall MRN: [id number] Date of Birth: [birth year] Referring Provider:  No ref. provider found   Number of Visits:   Reason for Discharge:  Patient independent in their exercise.  Smoking History:  History  Smoking status  . Not on file  Smokeless tobacco  . Not on file    Diagnosis:  No diagnosis found.  ADL UCSD:   Initial Exercise Prescription:   Discharge Exercise Prescription (Final Exercise Prescription Changes):     Exercise Prescription Changes - 03/21/15 0900    Exercise Review   Progression Yes   Response to Exercise   Symptoms None   Comments Patient is approaching graduation of the program and home exercise plans were discussed. Details of the patient's exercise prescription and what they need to do in order to continue the prescription and progress with exercise were outlined and the patient verbalized understanding. The patient plans to complete all exercise at [facility] with his wife or will notify us to join the fitness center in the [employer] program   Duration Progress to 50 minutes of aerobic without signs/symptoms of physical distress   Intensity Rest + 30   Progression Continue progressive overload as per policy without signs/symptoms or physical distress.   Resistance Training   Training Prescription Yes   Weight 8   Reps 10-15   Interval Training   Interval Training Yes   Equipment REL-XR   Comments L4-6 40-70w   Treadmill   MPH 3.3   Grade 2   Minutes 10   NuStep   Level 2   Watts 40   Minutes 15   Recumbant Elliptical   Level 5   RPM 45   Watts 40   Minutes 20   REL-XR   Level 5   Watts 80   Minutes 10   Home Exercise Plan   Plans to continue exercise at [employer] (comment)  [facility] or will call about [facility]      Functional Capacity:     6 Minute Walk      03/21/15 0908       6 Minute Walk   Phase Discharge     Distance 1670 feet     Distance %  Change 5 %     Walk Time 6 minutes     Resting HR 69 bpm     Resting BP 134/80 mmHg     Max Ex. HR 101 bpm     Max Ex. BP 160/72 mmHg     RPE 12     Symptoms No        Psychological, QOL, Others - Outcomes: PHQ 2/9: Depression screen [facility] 2/9 03/02/2015 11/29/2014  Decreased Interest 0 0  Down, Depressed, Hopeless 0 0  PHQ - 2 Score 0 0  Altered sleeping 1 -  Tired, decreased energy 1 -  Change in appetite 0 -  Feeling bad or failure about yourself  0 -  Trouble concentrating 0 -  Moving slowly or fidgety/restless 0 -  Suicidal thoughts 0 -  PHQ-9 Score 2 -  Difficult doing work/chores Not difficult at all -    Quality of Life:     Quality of Life - 04/11/15 1253    Quality of Life Scores   Health/Function Post 26.8 %   Health/Function % Change 8 %   Socioeconomic Post 29.64 %   Socioeconomic % Change 6 %   Psych/Spiritual Post 30 %  Psych/Spiritual % Change 3 %   Family Post 30 %   Family % Change 9 %   GLOBAL Post 28.51 %   GLOBAL % Change 6 %      Personal Goals: Goals established at orientation with interventions provided to work toward goal.     Personal Goals and Risk Factors at Admission - 11/29/14 0944    Personal Goals and Risk Factors on Admission   Increase Aerobic Exercise and Physical Activity Yes   Intervention While in program, learn and follow the exercise prescription taught. Start at a low level workload and increase workload after able to maintain previous level for 30 minutes. Increase time before increasing intensity.   Diabetes Yes   Goal Blood glucose control identified by blood glucose values, HgbA1C. Participant verbalizes understanding of the signs/symptoms of hyper/hypo glycemia, proper foot care and importance of medication and nutrition plan for blood glucose control.       Personal Goals Discharge:     Goals and Risk Factor Review      11/30/14 1114 12/20/14 1344 01/07/15 1312 01/26/15 1054 03/08/15 0740   Weight Management    Goals Progress/Improvement seen No Yes Yes     Comments Weight flucuates 3-4 pounds. Has been out of program for symptoms.  Weight unchaged from admission. HAs been out for medical concerns.  Has noticed some looseness in clothing Continues to notice inches dropping.     Increase Aerobic Exercise and Physical Activity   Goals Progress/Improvement seen  No Yes  Yes Yes   Comments Has been out of program for symptoms. Exercise routine is doing better.  Did review exercising at home. Has a stationary bike and will try to start using it 1 - 2 days a week Doing well with Exercise prescription  Brandon Wall has gained a lot of cardiovascular endurance and can exercise for 50 minutes in the gym without prolonged rest and can golf 18 holes. He is regularly golfing during the week and doing tournaments and is very excited to be back on the golf course and encoruaged by his progress to get there.    Diabetes   Goal Blood glucose control identified by blood glucose values, HgbA1C. Participant verbalizes understanding of the signs/symptoms of hyper/hypo glycemia, proper foot care and importance of medication and nutrition plan for blood glucose control.   Blood glucose control identified by blood glucose values, HgbA1C. Participant verbalizes understanding of the signs/symptoms of hyper/hypo glycemia, proper foot care and importance of medication and nutrition plan for blood glucose control.    Progress seen towards goals Unknown Yes Yes Yes    Comments no comments provided  FBS 120-140  Hopes to see HgbA1C results lower.     Hypertension   Goal Participant will see blood pressure controlled within the values of 140/23mm/Hg or within value directed by their physician.       Progress seen toward goals Yes Yes   Yes   Comments BP within normal ranges BP within normal ranges   Brandon Wall's post exercise blood pressures have been excellent. Sometimes    Abnormal Lipids   Progress seen towards goals Unknown Unknown   No    Comments No recent lab results to compare No recent lab results to compare   No blood testing completed to update this      03/25/15 0936           Increase Aerobic Exercise and Physical Activity   Goals Progress/Improvement seen  Yes  Comments Patient plays golf several days per week and walks part of the course while playing. He is also doing very well with his exercise prescription. He is aggresive on the equipment and works hard in class.           Nutrition & Weight - Outcomes:      Post Biometrics - 03/21/15 0907     Post  Biometrics   Height 6\' 3"  (1.905 m)   Weight 238 lb (107.956 kg)   Waist Circumference 43.5 inches   Hip Circumference 43 inches   Waist to Hip Ratio 1.01 %   BMI (Calculated) 29.8      Nutrition:   Nutrition Discharge:     Rate Your Plate - 65/53/74 8270    Rate Your Plate Scores   Post Score 63   Post Score % 70 %      Education Questionnaire Score:     Knowledge Questionnaire Score - 03/02/15 1028    Knowledge Questionnaire Score   Post Score 24/28      Goals reviewed with patient; copy given to patient.

## 2015-04-18 ENCOUNTER — Encounter: Payer: Self-pay | Admitting: *Deleted

## 2015-04-18 DIAGNOSIS — Z9861 Coronary angioplasty status: Secondary | ICD-10-CM

## 2015-04-18 NOTE — Progress Notes (Signed)
Cardiac Individual Treatment Plan  Patient Details  Name: Brandon Wall MRN: [id number] Date of Birth: [birth year] Referring Provider:  Maryan Wall Initial Encounter Date:    Visit Diagnosis: S/P PTCA (percutaneous transluminal coronary angioplasty)  Patient's Home Medications on Admission:  Current outpatient prescriptions:  .  apixaban (ELIQUIS) 5 MG TABS tablet, TAKE 1 TABLET BY MOUTH TWICE DAILY, Disp: , Rfl:  .  clopidogrel (PLAVIX) 75 MG tablet, Take by mouth., Disp: , Rfl:  .  glipiZIDE (GLUCOTROL) 5 MG tablet, Take by mouth., Disp: , Rfl:  .  glucose blood test strip, , Disp: , Rfl:  .  isosorbide mononitrate (IMDUR) 30 MG 24 hr tablet, Take by mouth., Disp: , Rfl:  .  ketoconazole (NIZORAL) 2 % cream, , Disp: , Rfl:  .  losartan (COZAAR) 100 MG tablet, TAKE 1 TABLET BY MOUTH EVERY DAY, Disp: , Rfl:  .  metFORMIN (GLUCOPHAGE) 1000 MG tablet, Take by mouth., Disp: , Rfl:  .  metoprolol succinate (TOPROL-XL) 100 MG 24 hr tablet, Take by mouth., Disp: , Rfl:  .  nitroGLYCERIN (NITROSTAT) 0.4 MG SL tablet, Place under the tongue., Disp: , Rfl:  .  rosuvastatin (CRESTOR) 40 MG tablet, Take by mouth., Disp: , Rfl:   Past Medical History: No past medical history on file.  Tobacco Use: History  Smoking status  . Not on file  Smokeless tobacco  . Not on file    Labs: Recent Review Flowsheet Data    There is no flowsheet data to display.       Exercise Target Goals:    Exercise Program Goal: Individual exercise prescription set with THRR, safety & activity barriers. Participant demonstrates ability to understand and report RPE using BORG scale, to self-measure pulse accurately, and to acknowledge the importance of the exercise prescription.  Exercise Prescription Goal: Starting with aerobic activity 30 plus minutes a day, 3 days per week for initial exercise prescription. Provide home exercise prescription and guidelines that participant acknowledges understanding prior  to discharge.  Activity Barriers & Risk Stratification:   6 Minute Walk:     6 Minute Walk      03/21/15 0908       6 Minute Walk   Phase Discharge     Distance 1670 feet     Distance % Change 5 %     Walk Time 6 minutes     Resting HR 69 bpm     Resting BP 134/80 mmHg     Max Ex. HR 101 bpm     Max Ex. BP 160/72 mmHg     RPE 12     Symptoms No        Initial Exercise Prescription:   Exercise Prescription Changes:     Exercise Prescription Changes      11/29/14 1600 11/30/14 1100 12/14/14 0700 12/27/14 1200 01/18/15 0700   Exercise Review   Progression No  Returned 11/29/14, easing back into previous workloads  No  Working slowly back to previous workloads No  Working slowly back to previous workloads No  Multiple PVCs, no increases until hear back from MD   Response to Exercise   Blood Pressure (Admit)  126/70 mmHg 110/68 mmHg 110/68 mmHg 132/64 mmHg   Blood Pressure (Exercise)  130/80 mmHg 130/62 mmHg 130/62 mmHg 148/78 mmHg   Blood Pressure (Exit)  122/70 mmHg 100/64 mmHg 100/64 mmHg 120/68 mmHg   Heart Rate (Admit)  73 bpm 76 bpm 76 bpm 80 bpm   Heart Rate (  Exercise)  102 bpm 84 bpm 84 bpm 105 bpm   Heart Rate (Exit)  62 bpm 72 bpm 72 bpm 85 bpm   Rating of Perceived Exertion (Exercise)  11 12 12 13    Symptoms   No No  Multiple PVCs, no symptoms with them. Sent info to MD   Duration  Progress to 30 minutes of continuous aerobic without signs/symptoms of physical distress Progress to 30 minutes of continuous aerobic without signs/symptoms of physical distress Progress to 30 minutes of continuous aerobic without signs/symptoms of physical distress Progress to 30 minutes of continuous aerobic without signs/symptoms of physical distress   Intensity  Rest + 30 Rest + 30 Rest + 30 Rest + 30   Progression  Continue progressive overload as per policy without signs/symptoms or physical distress. Continue progressive overload as per policy without signs/symptoms or  physical distress. Continue progressive overload as per policy without signs/symptoms or physical distress. Continue progressive overload as per policy without signs/symptoms or physical distress.   Resistance Training   Training Prescription  Yes Yes Yes Yes   Weight  body weight body weight body weight 8   Reps  10-12 10-15 10-15 10-15   Interval Training   Interval Training   No Yes Yes   Equipment    REL-XR REL-XR   Comments    L4-6 40-70w L4-6 40-70w   Treadmill   MPH  3.2 2.8 2.8 2.8   Grade  2 0 0 0   Minutes  15 10 10 10    NuStep   Level  2 2 2 2    Watts  40 40 40 40   Minutes  15 15 15 15    Recumbant Elliptical   Level   4 4 4    RPM   40 40 40   Watts   40 40 40   Minutes   20 20 20    REL-XR   Level  5 5 5 5    Watts  80 80 80 80   Minutes  10 10 10 10      02/02/15 0800 02/08/15 1000 02/22/15 0600 03/14/15 1000 03/15/15 0700   Exercise Review   Progression No  Multiple PVCs, no increases until hear back from MD No  Multiple PVCs, no increases until hear back from MD Yes Yes Yes   Response to Exercise   Blood Pressure (Admit) 132/64 mmHg  130/82 mmHg  134/82 mmHg   Blood Pressure (Exercise) 148/78 mmHg  148/86 mmHg  154/82 mmHg   Blood Pressure (Exit) 120/68 mmHg  122/64 mmHg  134/86 mmHg   Heart Rate (Admit) 80 bpm  87 bpm  80 bpm   Heart Rate (Exercise) 105 bpm  101 bpm  90 bpm   Heart Rate (Exit) 85 bpm  72 bpm  81 bpm   Rating of Perceived Exertion (Exercise) 13 13 13  12    Symptoms  Multiple PVCs, no symptoms with them. Sent info to MD  Multiple PVCs, no symptoms with them. Sent info to MD None None None   Comments     Discussed adding one day a week of exercise at home in addition to the three days a week at [facility]. Explained that 150 minutes a week of exercise is the goal for combating and managing chronic health conditions. This volume of exercise is also proven to give other health benefits. Brandon Wall is usually golfing for exercise outside of class.    Frequency     Add 2 additional days to program  exercise sessions.   Duration Progress to 30 minutes of continuous aerobic without signs/symptoms of physical distress Progress to 30 minutes of continuous aerobic without signs/symptoms of physical distress Progress to 50 minutes of aerobic without signs/symptoms of physical distress Progress to 50 minutes of aerobic without signs/symptoms of physical distress Progress to 50 minutes of aerobic without signs/symptoms of physical distress   Intensity Rest + 30 Rest + 30 Rest + 30 Rest + 30 Rest + 30   Progression Continue progressive overload as per policy without signs/symptoms or physical distress. Continue progressive overload as per policy without signs/symptoms or physical distress. Continue progressive overload as per policy without signs/symptoms or physical distress. Continue progressive overload as per policy without signs/symptoms or physical distress. Continue progressive overload as per policy without signs/symptoms or physical distress.   Resistance Training   Training Prescription Yes Yes Yes Yes Yes   Weight 8 8 8 8 8    Reps 10-15 10-15 10-15 10-15 10-15   Interval Training   Interval Training Yes Yes Yes Yes Yes   Equipment REL-XR REL-XR REL-XR REL-XR REL-XR   Comments L4-6 40-70w L4-6 40-70w L4-6 40-70w L4-6 40-70w L4-6 40-70w   Treadmill   MPH 2.8 2.8 3.3 3.3 3.3   Grade 0 0 3 2 2    Minutes 10 10 10 10 10    NuStep   Level 2 2 2 2 2    Watts 40 40 40 40 40   Minutes 15 15 15 15 15    Recumbant Elliptical   Level 5 5 5 5 5    RPM 40 40 40 45 45   Watts 40 40 40 40 40   Minutes 20 20 20 20 20    REL-XR   Level 5 5 5 5 5    Watts 80 80 80 80 80   Minutes 10 10 10 10 10      03/21/15 0900           Exercise Review   Progression Yes       Response to Exercise   Symptoms None       Comments Patient is approaching graduation of the program and home exercise plans were discussed. Details of the patient's exercise prescription and  what they need to do in order to continue the prescription and progress with exercise were outlined and the patient verbalized understanding. The patient plans to complete all exercise at [facility] with his wife or will notify us to join the fitness center in the [employer] program       Duration Progress to 50 minutes of aerobic without signs/symptoms of physical distress       Intensity Rest + 30       Progression Continue progressive overload as per policy without signs/symptoms or physical distress.       Resistance Training   Training Prescription Yes       Weight 8       Reps 10-15       Interval Training   Interval Training Yes       Equipment REL-XR       Comments L4-6 40-70w       Treadmill   MPH 3.3       Grade 2       Minutes 10       NuStep   Level 2       Watts 40       Minutes 15       Recumbant Elliptical   Level  5       RPM 45       Watts 40       Minutes 20       REL-XR   Level 5       Watts 80       Minutes 10       Home Exercise Plan   Plans to continue exercise at [facility] (comment)  [facility] or will call about [facility]          Discharge Exercise Prescription (Final Exercise Prescription Changes):     Exercise Prescription Changes - 03/21/15 0900    Exercise Review   Progression Yes   Response to Exercise   Symptoms None   Comments Patient is approaching graduation of the program and home exercise plans were discussed. Details of the patient's exercise prescription and what they need to do in order to continue the prescription and progress with exercise were outlined and the patient verbalized understanding. The patient plans to complete all exercise at [facility] with his wife or will notify us to join the fitness center in the [employer] program   Duration Progress to 50 minutes of aerobic without signs/symptoms of physical distress   Intensity Rest + 30   Progression Continue progressive overload as per policy without signs/symptoms or  physical distress.   Resistance Training   Training Prescription Yes   Weight 8   Reps 10-15   Interval Training   Interval Training Yes   Equipment REL-XR   Comments L4-6 40-70w   Treadmill   MPH 3.3   Grade 2   Minutes 10   NuStep   Level 2   Watts 40   Minutes 15   Recumbant Elliptical   Level 5   RPM 45   Watts 40   Minutes 20   REL-XR   Level 5   Watts 80   Minutes 10   Home Exercise Plan   Plans to continue exercise at [facility] (comment)  [facility] or will call about [facility]      Nutrition:  Target Goals: Understanding of nutrition guidelines, daily intake of sodium 1500mg , cholesterol 200mg , calories 30% from fat and 7% or less from saturated fats, daily to have 5 or more servings of fruits and vegetables.  Biometrics:      Barbie Haggis - 03/21/15 0626     Post  Biometrics   Height 6\' 3"  (1.905 m)   Weight 238 lb (107.956 kg)   Waist Circumference 43.5 inches   Hip Circumference 43 inches   Waist to Hip Ratio 1.01 %   BMI (Calculated) 29.8      Nutrition Therapy Plan and Nutrition Goals:   Nutrition Discharge: Rate Your Plate Scores:     Rate Your Plate - 94/85/46 2703    Rate Your Plate Scores   Post Score 63   Post Score % 70 %      Nutrition Goals Re-Evaluation:     Nutrition Goals Re-Evaluation      11/29/14 0943 12/20/14 1342 03/25/15 1001       Personal Goal #1 Re-Evaluation   Personal Goal #1 Eats healthy already Eats healthy already      Goal Progress Seen Yes Yes Yes     Comments  Continues to work on healthy eating habits.  Discussed steps to help. Looking at food labels, limiting fried foods(which Blayke does).          Psychosocial: Target Goals:  Acknowledge presence or absence of depression, maximize coping skills, provide positive support system. Participant is able to verbalize types and ability to use techniques and skills needed for reducing stress and depression.  Initial Review & Psychosocial  Screening:   Quality of Life Scores:     Quality of Life - 04/11/15 1253    Quality of Life Scores   Health/Function Post 26.8 %   Health/Function % Change 8 %   Socioeconomic Post 29.64 %   Socioeconomic % Change 6 %   Psych/Spiritual Post 30 %   Psych/Spiritual % Change 3 %   Family Post 30 %   Family % Change 9 %   GLOBAL Post 28.51 %   GLOBAL % Change 6 %      PHQ-9:     Recent Review Flowsheet Data    Depression screen [facility] 2/9 03/02/2015 11/29/2014   Decreased Interest 0 0   Down, Depressed, Hopeless 0 0   PHQ - 2 Score 0 0   Altered sleeping 1 -   Tired, decreased energy 1 -   Change in appetite 0 -   Feeling bad or failure about yourself  0 -   Trouble concentrating 0 -   Moving slowly or fidgety/restless 0 -   Suicidal thoughts 0 -   PHQ-9 Score 2 -   Difficult doing work/chores Not difficult at all -      Psychosocial Evaluation and Intervention:   Psychosocial Re-Evaluation:     Psychosocial Re-Evaluation      03/25/15 1002           Psychosocial Re-Evaluation   Interventions Encouraged to attend Cardiac Rehabilitation for the exercise       Comments Mansour plays golf for stress relief . 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           Vocational Rehabilitation: Provide vocational rehab assistance to qualifying candidates.   Vocational Rehab Evaluation & Intervention:   Education: Education Goals: Education classes will be provided on a weekly basis, covering required topics. Participant will state understanding/return demonstration of topics presented.  Learning Barriers/Preferences:   Education Topics: General Nutrition Guidelines/Fats and Fiber: -Group instruction provided by verbal, written material, models and posters to present the general guidelines for heart healthy nutrition. Gives an explanation and review of dietary  fats and fiber.          Cardiac Rehab from 01/05/2015 in [facility] Cardiac Rehab   Date  11/29/14   Educator  C. Joneen Caraway, R   Instruction Review Code  2- meets goals/outcomes      Controlling Sodium/Reading Food Labels: -Group verbal and written material supporting the discussion of sodium use in heart healthy nutrition. Review and explanation with models, verbal and written materials for utilization of the food label.   Exercise Physiology & Risk Factors: - Group verbal and written instruction with models to review the exercise physiology of the cardiovascular system and associated critical values. Details cardiovascular disease risk factors and the goals associated with each risk factor.   Aerobic Exercise & Resistance Training: - Gives group verbal and written discussion on the health impact of inactivity. On the components of aerobic and resistive training programs and the benefits of this training and how to safely progress through these programs.   Flexibility, Balance, General Exercise Guidelines: - Provides group verbal and written instruction on the benefits of flexibility and balance  training programs. Provides general exercise guidelines with specific guidelines to those with heart or lung disease. Demonstration and skill practice provided.   Stress Management: - Provides group verbal and written instruction about the health risks of elevated stress, cause of high stress, and healthy ways to reduce stress.   Depression: - Provides group verbal and written instruction on the correlation between heart/lung disease and depressed mood, treatment options, and the stigmas associated with seeking treatment.   Anatomy & Physiology of the Heart: - Group verbal and written instruction and models provide basic cardiac anatomy and physiology, with the coronary electrical and arterial systems. Review of: AMI, Angina, Valve disease, Heart Failure, Cardiac Arrhythmia, Pacemakers, and the  ICD.   Cardiac Procedures: - Group verbal and written instruction and models to describe the testing methods done to diagnose heart disease. Reviews the outcomes of the test results. Describes the treatment choices: Medical Management, Angioplasty, or Coronary Bypass Surgery.   Cardiac Medications: - Group verbal and written instruction to review commonly prescribed medications for heart disease. Reviews the medication, class of the drug, and side effects. Includes the steps to properly store meds and maintain the prescription regimen.   Go Sex-Intimacy & Heart Disease, Get SMART - Goal Setting: - Group verbal and written instruction through game format to discuss heart disease and the return to sexual intimacy. Provides group verbal and written material to discuss and apply goal setting through the application of the S.M.A.R.T. Method.   Other Matters of the Heart: - Provides group verbal, written materials and models to describe Heart Failure, Angina, Valve Disease, and Diabetes in the realm of heart disease. Includes description of the disease process and treatment options available to the cardiac patient.   Exercise & Equipment Safety: - Individual verbal instruction and demonstration of equipment use and safety with use of the equipment.   Infection Prevention: - Provides verbal and written material to individual with discussion of infection control including proper hand washing and proper equipment cleaning during exercise session.   Falls Prevention: - Provides verbal and written material to individual with discussion of falls prevention and safety.   Diabetes: - Individual verbal and written instruction to review signs/symptoms of diabetes, desired ranges of glucose level fasting, after meals and with exercise. Advice that pre and post exercise glucose checks will be done for 3 sessions at entry of program.    Knowledge Questionnaire Score:     Knowledge Questionnaire  Score - 03/02/15 1028    Knowledge Questionnaire Score   Post Score 24/28      Personal Goals and Risk Factors at Admission:     Personal Goals and Risk Factors at Admission - 11/29/14 0944    Personal Goals and Risk Factors on Admission   Increase Aerobic Exercise and Physical Activity Yes   Intervention While in program, learn and follow the exercise prescription taught. Start at a low level workload and increase workload after able to maintain previous level for 30 minutes. Increase time before increasing intensity.   Diabetes Yes   Goal Blood glucose control identified by blood glucose values, HgbA1C. Participant verbalizes understanding of the signs/symptoms of hyper/hypo glycemia, proper foot care and importance of medication and nutrition plan for blood glucose control.      Personal Goals and Risk Factors Review:      Goals and Risk Factor Review      11/30/14 1114 12/20/14 1344 01/07/15 1312 01/26/15 1054 03/08/15 0740   Weight Management   Goals Progress/Improvement seen  No Yes Yes     Comments Weight flucuates 3-4 pounds. Has been out of program for symptoms.  Weight unchaged from admission. HAs been out for medical concerns.  Has noticed some looseness in clothing Continues to notice inches dropping.     Increase Aerobic Exercise and Physical Activity   Goals Progress/Improvement seen  No Yes  Yes Yes   Comments Has been out of program for symptoms. Exercise routine is doing better.  Did review exercising at home. Has a stationary bike and will try to start using it 1 - 2 days a week Doing well with Exercise prescription  Hooper has gained a lot of cardiovascular endurance and can exercise for 50 minutes in the gym without prolonged rest and can golf 18 holes. He is regularly golfing during the week and doing tournaments and is very excited to be back on the golf course and encoruaged by his progress to get there.    Diabetes   Goal Blood glucose control identified by blood  glucose values, HgbA1C. Participant verbalizes understanding of the signs/symptoms of hyper/hypo glycemia, proper foot care and importance of medication and nutrition plan for blood glucose control.   Blood glucose control identified by blood glucose values, HgbA1C. Participant verbalizes understanding of the signs/symptoms of hyper/hypo glycemia, proper foot care and importance of medication and nutrition plan for blood glucose control.    Progress seen towards goals Unknown Yes Yes Yes    Comments no comments provided  FBS 120-140  Hopes to see HgbA1C results lower.     Hypertension   Goal Participant will see blood pressure controlled within the values of 140/34mm/Hg or within value directed by their physician.       Progress seen toward goals Yes Yes   Yes   Comments BP within normal ranges BP within normal ranges   Vondell's post exercise blood pressures have been excellent. Sometimes    Abnormal Lipids   Progress seen towards goals Unknown Unknown   No   Comments No recent lab results to compare No recent lab results to compare   No blood testing completed to update this      03/25/15 0936           Increase Aerobic Exercise and Physical Activity   Goals Progress/Improvement seen  Yes       Comments Patient plays golf several days per week and walks part of the course while playing. He is also doing very well with his exercise prescription. He is aggresive on the equipment and works hard in class.           Personal Goals Discharge (Final Personal Goals and Risk Factors Review):      Goals and Risk Factor Review - 03/25/15 0936    Increase Aerobic Exercise and Physical Activity   Goals Progress/Improvement seen  Yes   Comments Patient plays golf several days per week and walks part of the course while playing. He is also doing very well with his exercise prescription. He is aggresive on the equipment and works hard in class.        Comments: 30 day review Discharged

## 2016-06-11 DIAGNOSIS — K219 Gastro-esophageal reflux disease without esophagitis: Secondary | ICD-10-CM | POA: Insufficient documentation

## 2017-01-10 IMAGING — CR DG CHEST 1V PORT
1 series · 1 of 1 positions shown · non-contrast
Comparison: 12/23/2007

CLINICAL DATA: Chest pain starting on treadmill this morning

EXAM:
PORTABLE CHEST - 1 VIEW

[ap]
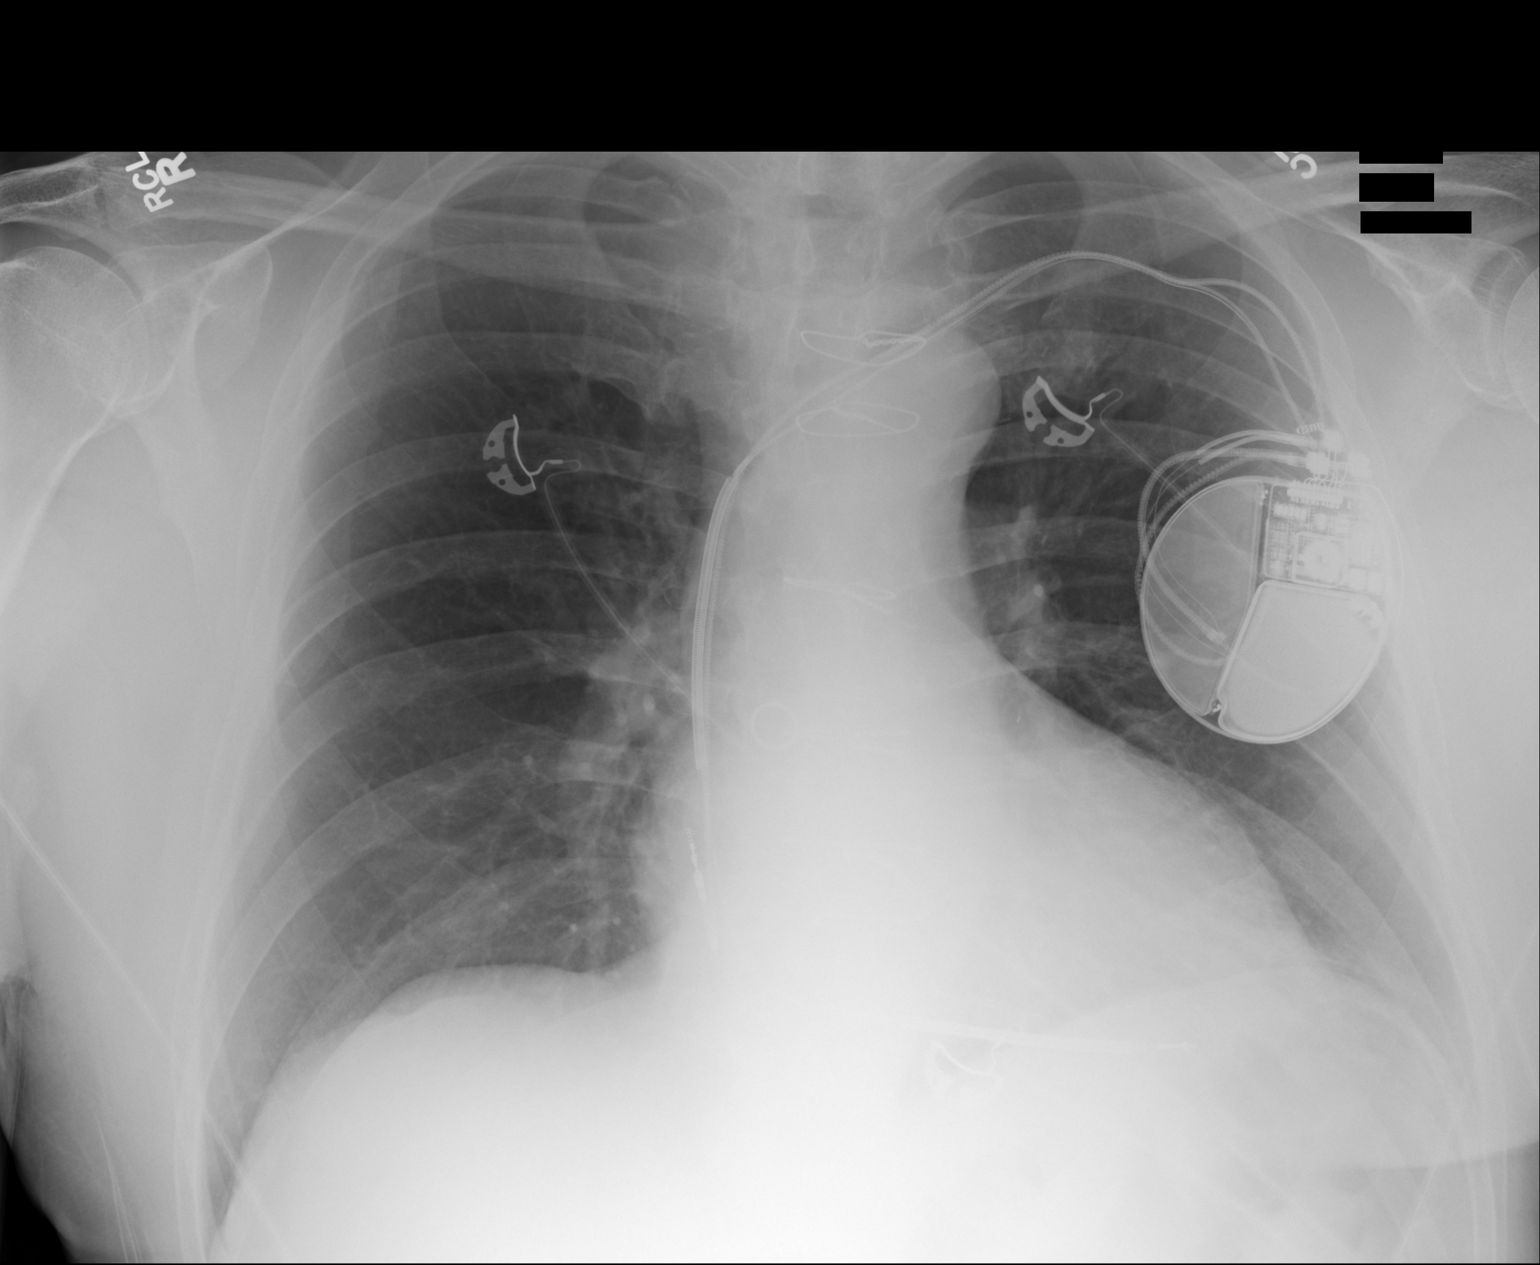

[1 of 1 positions shown; findings below may reference images not displayed]

FINDINGS: Cardiomediastinal silhouette is stable. Status post median
sternotomy. Dual lead cardiac pacemaker in place. No acute
infiltrate or pulmonary edema. Stable left basilar atelectasis or
scarring.
IMPRESSION: No active disease.

## 2018-03-10 ENCOUNTER — Encounter: Payer: Medicare Other | Attending: Cardiovascular Disease | Admitting: *Deleted

## 2018-03-10 ENCOUNTER — Encounter: Payer: Self-pay | Admitting: *Deleted

## 2018-03-10 VITALS — Ht 75.5 in | Wt 230.1 lb

## 2018-03-10 DIAGNOSIS — Z79899 Other long term (current) drug therapy: Secondary | ICD-10-CM | POA: Insufficient documentation

## 2018-03-10 DIAGNOSIS — I213 ST elevation (STEMI) myocardial infarction of unspecified site: Secondary | ICD-10-CM | POA: Insufficient documentation

## 2018-03-10 NOTE — Progress Notes (Signed)
Daily Session Note  Patient Details  Name: Brandon Wall MRN: [id number] Date of Birth: 31-Jul-1940 Referring Provider:     Cardiac Rehab from 03/10/2018 in [facility] Cardiac and Pulmonary Rehab  Referring Provider  Chanetta Marshall MD      Encounter Date: 03/10/2018  Check In: Session Check In - 03/10/18 1248      Check-In   Supervising physician immediately available to respond to emergencies  See telemetry face sheet for immediately available ER MD    Location  [facility]-Cardiac & Pulmonary Rehab    Staff Present  Renita Papa, RN BSN;Jessica Luan Pulling, MA, RCEP, CCRP, Exercise Physiologist    Warm-up and Cool-down  Not performed (comment)   Med Review completed   Resistance Training Performed  Yes    VAD Patient?  No    PAD/SET Patient?  No      Pain Assessment   Currently in Pain?  No/denies        Exercise Prescription Changes - 03/10/18 1200      Response to Exercise   Blood Pressure (Admit)  126/64    Blood Pressure (Exercise)  134/68    Blood Pressure (Exit)  112/66    Heart Rate (Admit)  58 bpm    Heart Rate (Exercise)  81 bpm    Heart Rate (Exit)  50 bpm    Oxygen Saturation (Admit)  98 %    Oxygen Saturation (Exercise)  98 %    Rating of Perceived Exertion (Exercise)  12    Symptoms  none    Comments  walk test results       Social History   Tobacco Use  Smoking Status Never Smoker  Smokeless Tobacco Never Used    Goals Met:  Proper associated with RPD/PD & O2 Sat Exercise tolerated well Personal goals reviewed No report of cardiac concerns or symptoms Strength training completed today  Goals Unmet:  Not Applicable  Comments: Med review completed   Dr. Emily Filbert is Medical Director for [place] and [name].

## 2018-03-10 NOTE — Patient Instructions (Signed)
Patient Instructions  Patient Details  Name: Brandon Wall MRN: [id number] Date of Birth: [birth year] Referring Provider:  Eliseo Gum*  Below are your personal goals for exercise, nutrition, and risk factors. Our goal is to help you stay on track towards obtaining and maintaining these goals. We will be discussing your progress on these goals with you throughout the program.  Initial Exercise Prescription: Initial Exercise Prescription - 03/10/18 1200      Date of Initial Exercise RX and Referring Provider   Date  03/10/18    Referring Provider  Chanetta Marshall MD      Treadmill   MPH  2.1    Grade  0.5    Minutes  15    METs  2.75      NuStep   Level  2    SPM  80    Minutes  15    METs  2.5      REL-XR   Level  1    Speed  50    Minutes  15    METs  2.5      Prescription Details   Frequency (times per week)  2    Duration  Progress to 30 minutes of continuous aerobic without signs/symptoms of physical distress      Intensity   THRR 40-80% of Max Heartrate  92-125    Ratings of Perceived Exertion  11-13    Perceived Dyspnea  0-4      Progression   Progression  Continue to progress workloads to maintain intensity without signs/symptoms of physical distress.      Resistance Training   Training Prescription  Yes    Weight  3 lbs    Reps  10-15       Exercise Goals: Frequency: Be able to perform aerobic exercise two to three times per week in program working toward 2-5 days per week of home exercise.  Intensity: Work with a perceived exertion of 11 (fairly light) - 15 (hard) while following your exercise prescription.  We will make changes to your prescription with you as you progress through the program.   Duration: Be able to do 30 to 45 minutes of continuous aerobic exercise in addition to a 5 minute warm-up and a 5 minute cool-down routine.   Nutrition Goals: Your personal nutrition goals will be established when you do your nutrition  analysis with the dietician.  The following are general nutrition guidelines to follow: Cholesterol < 200mg /day Sodium < 1500mg /day Fiber: Men over 50 yrs - 30 grams per day  Personal Goals: Personal Goals and Risk Factors at Admission - 03/10/18 1253      Core Components/Risk Factors/Patient Goals on Admission    Weight Management  Yes;Weight Loss;Obesity    Intervention  Weight Management: Develop a combined nutrition and exercise program designed to reach desired caloric intake, while maintaining appropriate intake of nutrient and fiber, sodium and fats, and appropriate energy expenditure required for the weight goal.;Weight Management: Provide education and appropriate resources to help participant work on and attain dietary goals.;Obesity: Provide education and appropriate resources to help participant work on and attain dietary goals.;Weight Management/Obesity: Establish reasonable short term and long term weight goals.    Admit Weight  230 lb (104.3 kg)    Goal Weight: Short Term  225 lb (102.1 kg)    Goal Weight: Long Term  210 lb (95.3 kg)    Expected Outcomes  Short Term: Continue to assess and modify interventions until short  term weight is achieved;Long Term: Adherence to nutrition and physical activity/exercise program aimed toward attainment of established weight goal;Understanding recommendations for meals to include 15-35% energy as protein, 25-35% energy from fat, 35-60% energy from carbohydrates, less than 200mg  of dietary cholesterol, 20-35 gm of total fiber daily;Understanding of distribution of calorie intake throughout the day with the consumption of 4-5 meals/snacks;Weight Loss: Understanding of general recommendations for a balanced deficit meal plan, which promotes 1-2 lb weight loss per week and includes a negative energy balance of [phone] kcal/d    Diabetes  Yes    Intervention  Provide education about signs/symptoms and action to take for hypo/hyperglycemia.;Provide  education about proper nutrition, including hydration, and aerobic/resistive exercise prescription along with prescribed medications to achieve blood glucose in normal ranges: Fasting glucose 65-99 mg/dL    Expected Outcomes  Short Term: Participant verbalizes understanding of the signs/symptoms and immediate care of hyper/hypoglycemia, proper foot care and importance of medication, aerobic/resistive exercise and nutrition plan for blood glucose control.;Long Term: Attainment of HbA1C < 7%.    Hypertension  Yes    Intervention  Provide education on lifestyle modifcations including regular physical activity/exercise, weight management, moderate sodium restriction and increased consumption of fresh fruit, vegetables, and low fat dairy, alcohol moderation, and smoking cessation.;Monitor prescription use compliance.    Expected Outcomes  Short Term: Continued assessment and intervention until BP is < 140/57mm HG in hypertensive participants. < 130/66mm HG in hypertensive participants with diabetes, heart failure or chronic kidney disease.;Long Term: Maintenance of blood pressure at goal levels.    Lipids  Yes    Intervention  Provide education and support for participant on nutrition & aerobic/resistive exercise along with prescribed medications to achieve LDL 70mg , HDL >40mg .    Expected Outcomes  Short Term: Participant states understanding of desired cholesterol values and is compliant with medications prescribed. Participant is following exercise prescription and nutrition guidelines.;Long Term: Cholesterol controlled with medications as prescribed, with individualized exercise RX and with personalized nutrition plan. Value goals: LDL < 70mg , HDL > 40 mg.       Tobacco Use Initial Evaluation: Social History   Tobacco Use  Smoking Status Never Smoker  Smokeless Tobacco Never Used    Exercise Goals and Review: Exercise Goals    Row Name 03/10/18 1301             Exercise Goals   Increase  Physical Activity  Yes       Intervention  Provide advice, education, support and counseling about physical activity/exercise needs.;Develop an individualized exercise prescription for aerobic and resistive training based on initial evaluation findings, risk stratification, comorbidities and participant's personal goals.       Expected Outcomes  Short Term: Attend rehab on a regular basis to increase amount of physical activity.;Long Term: Exercising regularly at least 3-5 days a week.;Long Term: Add in home exercise to make exercise part of routine and to increase amount of physical activity.       Increase Strength and Stamina  Yes       Intervention  Provide advice, education, support and counseling about physical activity/exercise needs.;Develop an individualized exercise prescription for aerobic and resistive training based on initial evaluation findings, risk stratification, comorbidities and participant's personal goals.       Expected Outcomes  Short Term: Increase workloads from initial exercise prescription for resistance, speed, and METs.;Short Term: Perform resistance training exercises routinely during rehab and add in resistance training at home;Long Term: Improve cardiorespiratory fitness, muscular endurance and strength  as measured by increased METs and functional capacity (6MWT)       Able to understand and use rate of perceived exertion (RPE) scale  Yes       Intervention  Provide education and explanation on how to use RPE scale       Expected Outcomes  Short Term: Able to use RPE daily in rehab to express subjective intensity level;Long Term:  Able to use RPE to guide intensity level when exercising independently       Able to understand and use Dyspnea scale  Yes       Intervention  Provide education and explanation on how to use Dyspnea scale       Expected Outcomes  Short Term: Able to use Dyspnea scale daily in rehab to express subjective sense of shortness of breath during  exertion;Long Term: Able to use Dyspnea scale to guide intensity level when exercising independently       Knowledge and understanding of Target Heart Rate Range (THRR)  Yes       Intervention  Provide education and explanation of THRR including how the numbers were predicted and where they are located for reference       Expected Outcomes  Short Term: Able to state/look up THRR;Short Term: Able to use daily as guideline for intensity in rehab;Long Term: Able to use THRR to govern intensity when exercising independently       Able to check pulse independently  Yes       Intervention  Provide education and demonstration on how to check pulse in carotid and radial arteries.;Review the importance of being able to check your own pulse for safety during independent exercise       Expected Outcomes  Short Term: Able to explain why pulse checking is important during independent exercise;Long Term: Able to check pulse independently and accurately       Understanding of Exercise Prescription  Yes       Intervention  Provide education, explanation, and written materials on patient's individual exercise prescription       Expected Outcomes  Short Term: Able to explain program exercise prescription;Long Term: Able to explain home exercise prescription to exercise independently          Copy of goals given to participant.

## 2018-03-10 NOTE — Progress Notes (Signed)
Cardiac Individual Treatment Plan  Patient Details  Name: Brandon Wall MRN: [id number] Date of Birth: Oct 24, 1940 Referring Provider:     Cardiac Rehab from 03/10/2018 in [facility] Cardiac and Pulmonary Rehab  Referring Provider  Chanetta Marshall MD      Initial Encounter Date:    Cardiac Rehab from 03/10/2018 in [facility] Cardiac and Pulmonary Rehab  Date  03/10/18      Visit Diagnosis: ST elevation myocardial infarction (STEMI), unspecified artery ([place])  Patient's Home Medications on Admission:  Current Outpatient Medications:  .  apixaban (ELIQUIS) 5 MG TABS tablet, TAKE 1 TABLET BY MOUTH TWICE DAILY, Disp: , Rfl:  .  aspirin 81 MG chewable tablet, Chew by mouth., Disp: , Rfl:  .  clopidogrel (PLAVIX) 75 MG tablet, , Disp: , Rfl: 0 .  glipiZIDE (GLUCOTROL) 5 MG tablet, Take by mouth., Disp: , Rfl:  .  glucose blood test strip, , Disp: , Rfl:  .  isosorbide mononitrate (IMDUR) 30 MG 24 hr tablet, Take by mouth., Disp: , Rfl:  .  JARDIANCE 25 MG TABS tablet, , Disp: , Rfl:  .  losartan (COZAAR) 50 MG tablet, 50 mg daily. , Disp: , Rfl:  .  metFORMIN (GLUCOPHAGE) 1000 MG tablet, Take 2,000 mg by mouth 2 (two) times daily with a meal. , Disp: , Rfl:  .  metoprolol succinate (TOPROL-XL) 100 MG 24 hr tablet, Take 50 mg by mouth. , Disp: , Rfl:  .  nitroGLYCERIN (NITROSTAT) 0.4 MG SL tablet, Place under the tongue., Disp: , Rfl:  .  pantoprazole (PROTONIX) 40 MG tablet, Take by mouth., Disp: , Rfl:  .  rosuvastatin (CRESTOR) 40 MG tablet, Take by mouth., Disp: , Rfl:  .  sotalol (BETAPACE) 80 MG tablet, TK 1 T PO BID, Disp: , Rfl: 2 .  spironolactone (ALDACTONE) 25 MG tablet, TK SS T PO ONCE D, Disp: , Rfl: 10 .  ketoconazole (NIZORAL) 2 % cream, , Disp: , Rfl:   Past Medical History: No past medical history on file.  Tobacco Use: Social History   Tobacco Use  Smoking Status Never Smoker  Smokeless Tobacco Never Used    Labs: Recent Review Flowsheet Data    There is no flowsheet  data to display.       Exercise Target Goals: Exercise Program Goal: Individual exercise prescription set using results from initial 6 min walk test and THRR while considering  patient's activity barriers and safety.   Exercise Prescription Goal: Initial exercise prescription builds to 30-45 minutes a day of aerobic activity, 2-3 days per week.  Home exercise guidelines will be given to patient during program as part of exercise prescription that the participant will acknowledge.  Activity Barriers & Risk Stratification: Activity Barriers & Cardiac Risk Stratification - 03/10/18 1251      Activity Barriers & Cardiac Risk Stratification   Activity Barriers  Arthritis;Deconditioning;Muscular Weakness;Balance Concerns;Decreased Ventricular Function    Cardiac Risk Stratification  High       6 Minute Walk: 6 Minute Walk    Row Name 03/10/18 1250         6 Minute Walk   Phase  Initial     Distance  1468 feet     Walk Time  6 minutes     # of Rest Breaks  0     MPH  2.78     METS  2.64     RPE  12     VO2 Peak  9.26  Symptoms  No     Resting HR  58 bpm     Resting BP  126/64     Resting Oxygen Saturation   98 %     Exercise Oxygen Saturation  during 6 min walk  98 %     Max Ex. HR  81 bpm     Max Ex. BP  134/68     2 Minute Post BP  112/66        Oxygen Initial Assessment:   Oxygen Re-Evaluation:   Oxygen Discharge (Final Oxygen Re-Evaluation):   Initial Exercise Prescription: Initial Exercise Prescription - 03/10/18 1200      Date of Initial Exercise RX and Referring Provider   Date  03/10/18    Referring Provider  Chanetta Marshall MD      Treadmill   MPH  2.1    Grade  0.5    Minutes  15    METs  2.75      NuStep   Level  2    SPM  80    Minutes  15    METs  2.5      REL-XR   Level  1    Speed  50    Minutes  15    METs  2.5      Prescription Details   Frequency (times per week)  2    Duration  Progress to 30 minutes of continuous  aerobic without signs/symptoms of physical distress      Intensity   THRR 40-80% of Max Heartrate  92-125    Ratings of Perceived Exertion  11-13    Perceived Dyspnea  0-4      Progression   Progression  Continue to progress workloads to maintain intensity without signs/symptoms of physical distress.      Resistance Training   Training Prescription  Yes    Weight  3 lbs    Reps  10-15       Perform Capillary Blood Glucose checks as needed.  Exercise Prescription Changes: Exercise Prescription Changes    Row Name 03/10/18 1200             Response to Exercise   Blood Pressure (Admit)  126/64       Blood Pressure (Exercise)  134/68       Blood Pressure (Exit)  112/66       Heart Rate (Admit)  58 bpm       Heart Rate (Exercise)  81 bpm       Heart Rate (Exit)  50 bpm       Oxygen Saturation (Admit)  98 %       Oxygen Saturation (Exercise)  98 %       Rating of Perceived Exertion (Exercise)  12       Symptoms  none       Comments  walk test results          Exercise Comments:   Exercise Goals and Review: Exercise Goals    Row Name 03/10/18 1301             Exercise Goals   Increase Physical Activity  Yes       Intervention  Provide advice, education, support and counseling about physical activity/exercise needs.;Develop an individualized exercise prescription for aerobic and resistive training based on initial evaluation findings, risk stratification, comorbidities and participant's personal goals.       Expected Outcomes  Short Term: Attend rehab on a regular basis  to increase amount of physical activity.;Long Term: Exercising regularly at least 3-5 days a week.;Long Term: Add in home exercise to make exercise part of routine and to increase amount of physical activity.       Increase Strength and Stamina  Yes       Intervention  Provide advice, education, support and counseling about physical activity/exercise needs.;Develop an individualized exercise  prescription for aerobic and resistive training based on initial evaluation findings, risk stratification, comorbidities and participant's personal goals.       Expected Outcomes  Short Term: Increase workloads from initial exercise prescription for resistance, speed, and METs.;Short Term: Perform resistance training exercises routinely during rehab and add in resistance training at home;Long Term: Improve cardiorespiratory fitness, muscular endurance and strength as measured by increased METs and functional capacity (6MWT)       Able to understand and use rate of perceived exertion (RPE) scale  Yes       Intervention  Provide education and explanation on how to use RPE scale       Expected Outcomes  Short Term: Able to use RPE daily in rehab to express subjective intensity level;Long Term:  Able to use RPE to guide intensity level when exercising independently       Able to understand and use Dyspnea scale  Yes       Intervention  Provide education and explanation on how to use Dyspnea scale       Expected Outcomes  Short Term: Able to use Dyspnea scale daily in rehab to express subjective sense of shortness of breath during exertion;Long Term: Able to use Dyspnea scale to guide intensity level when exercising independently       Knowledge and understanding of Target Heart Rate Range (THRR)  Yes       Intervention  Provide education and explanation of THRR including how the numbers were predicted and where they are located for reference       Expected Outcomes  Short Term: Able to state/look up THRR;Short Term: Able to use daily as guideline for intensity in rehab;Long Term: Able to use THRR to govern intensity when exercising independently       Able to check pulse independently  Yes       Intervention  Provide education and demonstration on how to check pulse in carotid and radial arteries.;Review the importance of being able to check your own pulse for safety during independent exercise        Expected Outcomes  Short Term: Able to explain why pulse checking is important during independent exercise;Long Term: Able to check pulse independently and accurately       Understanding of Exercise Prescription  Yes       Intervention  Provide education, explanation, and written materials on patient's individual exercise prescription       Expected Outcomes  Short Term: Able to explain program exercise prescription;Long Term: Able to explain home exercise prescription to exercise independently          Exercise Goals Re-Evaluation :   Discharge Exercise Prescription (Final Exercise Prescription Changes): Exercise Prescription Changes - 03/10/18 1200      Response to Exercise   Blood Pressure (Admit)  126/64    Blood Pressure (Exercise)  134/68    Blood Pressure (Exit)  112/66    Heart Rate (Admit)  58 bpm    Heart Rate (Exercise)  81 bpm    Heart Rate (Exit)  50 bpm    Oxygen Saturation (Admit)  98 %    Oxygen Saturation (Exercise)  98 %    Rating of Perceived Exertion (Exercise)  12    Symptoms  none    Comments  walk test results       Nutrition:  Target Goals: Understanding of nutrition guidelines, daily intake of sodium <1595m, cholesterol <2059m calories 30% from fat and 7% or less from saturated fats, daily to have 5 or more servings of fruits and vegetables.  Biometrics: Pre Biometrics - 03/10/18 1302      Pre Biometrics   Height  6' 3.5" (1.918 m)    Weight  230 lb 1.6 oz (104.4 kg)    Waist Circumference  40 inches    Hip Circumference  43 inches    Waist to Hip Ratio  0.93 %    BMI (Calculated)  28.37    Single Leg Stand  5.73 seconds        Nutrition Therapy Plan and Nutrition Goals: Nutrition Therapy & Goals - 03/10/18 1258      Intervention Plan   Intervention  Prescribe, educate and counsel regarding individualized specific dietary modifications aiming towards targeted core components such as weight, hypertension, lipid management, diabetes, heart  failure and other comorbidities.;Nutrition handout(s) given to patient.    Expected Outcomes  Short Term Goal: Understand basic principles of dietary content, such as calories, fat, sodium, cholesterol and nutrients.;Short Term Goal: A plan has been developed with personal nutrition goals set during dietitian appointment.;Long Term Goal: Adherence to prescribed nutrition plan.       Nutrition Assessments: Nutrition Assessments - 03/10/18 1100      MEDFICTS Scores   Pre Score  47       Nutrition Goals Re-Evaluation:   Nutrition Goals Discharge (Final Nutrition Goals Re-Evaluation):   Psychosocial: Target Goals: Acknowledge presence or absence of significant depression and/or stress, maximize coping skills, provide positive support system. Participant is able to verbalize types and ability to use techniques and skills needed for reducing stress and depression.   Initial Review & Psychosocial Screening: Initial Psych Review & Screening - 03/10/18 1255      Initial Review   Current issues with  Current Stress Concerns    Source of Stress Concerns  Unable to perform yard/household activities    Comments  Garron wants to build back up his stamina so he can feel better daily. He states he sleeps well at night, but is still sleepy sometimes during the day. He is socially active, whether its eating breakfast with one of his sons or friends or playing golf weekly.       Family Dynamics   Good Support System?  Yes   spouse     Barriers   Psychosocial barriers to participate in program  There are no identifiable barriers or psychosocial needs.;The patient should benefit from training in stress management and relaxation.      Screening Interventions   Interventions  Encouraged to exercise;Program counselor consult    Expected Outcomes  Short Term goal: Utilizing psychosocial counselor, staff and physician to assist with identification of specific Stressors or current issues interfering  with healing process. Setting desired goal for each stressor or current issue identified.;Long Term Goal: Stressors or current issues are controlled or eliminated.;Short Term goal: Identification and review with participant of any Quality of Life or Depression concerns found by scoring the questionnaire.;Long Term goal: The participant improves quality of Life and PHQ9 Scores as seen by post scores and/or verbalization of changes  Quality of Life Scores:  Quality of Life - 03/10/18 1258      Quality of Life   Select  Quality of Life      Quality of Life Scores   Health/Function Pre  22.8 %    Socioeconomic Pre  26.14 %    Psych/Spiritual Pre  27.43 %    Family Pre  25.2 %    GLOBAL Pre  24.79 %      Scores of 19 and below usually indicate a poorer quality of life in these areas.  A difference of  2-3 points is a clinically meaningful difference.  A difference of 2-3 points in the total score of the Quality of Life Index has been associated with significant improvement in overall quality of life, self-image, physical symptoms, and general health in studies assessing change in quality of life.  PHQ-9: Recent Review Flowsheet Data    Depression screen [facility] 2/9 03/10/2018 03/02/2015 11/29/2014   Decreased Interest 0 0 0   Down, Depressed, Hopeless 0 0 0   PHQ - 2 Score 0 0 0   Altered sleeping 0 1 -   Tired, decreased energy 3 1 -   Change in appetite 0 0 -   Feeling bad or failure about yourself  0 0 -   Trouble concentrating 0 0 -   Moving slowly or fidgety/restless 0 0 -   Suicidal thoughts 0 0 -   PHQ-9 Score 3 2 -   Difficult doing work/chores Not difficult at all Not difficult at all -     Interpretation of Total Score  Total Score Depression Severity:  1-4 = Minimal depression, 5-9 = Mild depression, 10-14 = Moderate depression, 15-19 = Moderately severe depression, 20-27 = Severe depression   Psychosocial Evaluation and Intervention:   Psychosocial  Re-Evaluation:   Psychosocial Discharge (Final Psychosocial Re-Evaluation):   Vocational Rehabilitation: Provide vocational rehab assistance to qualifying candidates.   Vocational Rehab Evaluation & Intervention: Vocational Rehab - 03/10/18 1255      Initial Vocational Rehab Evaluation & Intervention   Assessment shows need for Vocational Rehabilitation  No       Education: Education Goals: Education classes will be provided on a variety of topics geared toward better understanding of heart health and risk factor modification. Participant will state understanding/return demonstration of topics presented as noted by education test scores.  Learning Barriers/Preferences: Learning Barriers/Preferences - 03/10/18 1255      Learning Barriers/Preferences   Learning Barriers  None    Learning Preferences  None       Education Topics:  AED/CPR: - Group verbal and written instruction with the use of models to demonstrate the basic use of the AED with the basic ABC's of resuscitation.   General Nutrition Guidelines/Fats and Fiber: -Group instruction provided by verbal, written material, models and posters to present the general guidelines for heart healthy nutrition. Gives an explanation and review of dietary fats and fiber.   Cardiac Rehab from 01/05/2015 in [facility] Cardiac and Pulmonary Rehab  Date  11/29/14  Educator  C. Joneen Caraway, R  Instruction Review Code (retired)  2- meets goals/outcomes      Controlling Sodium/Reading Food Labels: -Group verbal and written material supporting the discussion of sodium use in heart healthy nutrition. Review and explanation with models, verbal and written materials for utilization of the food label.   Exercise Physiology & General Exercise Guidelines: - Group verbal and written instruction with models to review the exercise physiology of the cardiovascular  system and associated critical values. Provides general exercise guidelines with specific  guidelines to those with heart or lung disease.    Aerobic Exercise & Resistance Training: - Gives group verbal and written instruction on the various components of exercise. Focuses on aerobic and resistive training programs and the benefits of this training and how to safely progress through these programs..   Flexibility, Balance, Mind/Body Relaxation: Provides group verbal/written instruction on the benefits of flexibility and balance training, including mind/body exercise modes such as yoga, pilates and tai chi.  Demonstration and skill practice provided.   Stress and Anxiety: - Provides group verbal and written instruction about the health risks of elevated stress and causes of high stress.  Discuss the correlation between heart/lung disease and anxiety and treatment options. Review healthy ways to manage with stress and anxiety.   Depression: - Provides group verbal and written instruction on the correlation between heart/lung disease and depressed mood, treatment options, and the stigmas associated with seeking treatment.   Anatomy & Physiology of the Heart: - Group verbal and written instruction and models provide basic cardiac anatomy and physiology, with the coronary electrical and arterial systems. Review of Valvular disease and Heart Failure   Cardiac Procedures: - Group verbal and written instruction to review commonly prescribed medications for heart disease. Reviews the medication, class of the drug, and side effects. Includes the steps to properly store meds and maintain the prescription regimen. (beta blockers and nitrates)   Cardiac Medications I: - Group verbal and written instruction to review commonly prescribed medications for heart disease. Reviews the medication, class of the drug, and side effects. Includes the steps to properly store meds and maintain the prescription regimen.   Cardiac Medications II: -Group verbal and written instruction to review commonly  prescribed medications for heart disease. Reviews the medication, class of the drug, and side effects. (all other drug classes)    Go Sex-Intimacy & Heart Disease, Get SMART - Goal Setting: - Group verbal and written instruction through game format to discuss heart disease and the return to sexual intimacy. Provides group verbal and written material to discuss and apply goal setting through the application of the S.M.A.R.T. Method.   Other Matters of the Heart: - Provides group verbal, written materials and models to describe Stable Angina and Peripheral Artery. Includes description of the disease process and treatment options available to the cardiac patient.   Exercise & Equipment Safety: - Individual verbal instruction and demonstration of equipment use and safety with use of the equipment.   Cardiac Rehab from 03/10/2018 in [facility] Cardiac and Pulmonary Rehab  Date  03/10/18  Educator  [facility]  Instruction Review Code  1- Verbalizes Understanding      Infection Prevention: - Provides verbal and written material to individual with discussion of infection control including proper hand washing and proper equipment cleaning during exercise session.   Falls Prevention: - Provides verbal and written material to individual with discussion of falls prevention and safety.   Cardiac Rehab from 03/10/2018 in [facility] Cardiac and Pulmonary Rehab  Date  03/10/18  Educator  [facility]  Instruction Review Code  1- Verbalizes Understanding      Diabetes: - Individual verbal and written instruction to review signs/symptoms of diabetes, desired ranges of glucose level fasting, after meals and with exercise. Acknowledge that pre and post exercise glucose checks will be done for 3 sessions at entry of program.   Cardiac Rehab from 03/10/2018 in [facility] Cardiac and Pulmonary Rehab  Date  03/10/18  Educator  [place]  Instruction Review Code  1- Verbalizes Understanding      Know Your Numbers and Risk Factors: -Group  verbal and written instruction about important numbers in your health.  Discussion of what are risk factors and how they play a role in the disease process.  Review of Cholesterol, Blood Pressure, Diabetes, and BMI and the role they play in your overall health.   Sleep Hygiene: -Provides group verbal and written instruction about how sleep can affect your health.  Define sleep hygiene, discuss sleep cycles and impact of sleep habits. Review good sleep hygiene tips.    Other: -Provides group and verbal instruction on various topics (see comments)   Knowledge Questionnaire Score: Knowledge Questionnaire Score - 03/10/18 1100      Knowledge Questionnaire Score   Pre Score  23/26   test reviewed with pt today      Core Components/Risk Factors/Patient Goals at Admission: Personal Goals and Risk Factors at Admission - 03/10/18 1253      Core Components/Risk Factors/Patient Goals on Admission    Weight Management  Yes;Weight Loss;Obesity    Intervention  Weight Management: Develop a combined nutrition and exercise program designed to reach desired caloric intake, while maintaining appropriate intake of nutrient and fiber, sodium and fats, and appropriate energy expenditure required for the weight goal.;Weight Management: Provide education and appropriate resources to help participant work on and attain dietary goals.;Obesity: Provide education and appropriate resources to help participant work on and attain dietary goals.;Weight Management/Obesity: Establish reasonable short term and long term weight goals.    Admit Weight  230 lb (104.3 kg)    Goal Weight: Short Term  225 lb (102.1 kg)    Goal Weight: Long Term  210 lb (95.3 kg)    Expected Outcomes  Short Term: Continue to assess and modify interventions until short term weight is achieved;Long Term: Adherence to nutrition and physical activity/exercise program aimed toward attainment of established weight goal;Understanding recommendations  for meals to include 15-35% energy as protein, 25-35% energy from fat, 35-60% energy from carbohydrates, less than '200mg'$  of dietary cholesterol, 20-35 gm of total fiber daily;Understanding of distribution of calorie intake throughout the day with the consumption of 4-5 meals/snacks;Weight Loss: Understanding of general recommendations for a balanced deficit meal plan, which promotes 1-2 lb weight loss per week and includes a negative energy balance of [phone] kcal/d    Diabetes  Yes    Intervention  Provide education about signs/symptoms and action to take for hypo/hyperglycemia.;Provide education about proper nutrition, including hydration, and aerobic/resistive exercise prescription along with prescribed medications to achieve blood glucose in normal ranges: Fasting glucose 65-99 mg/dL    Expected Outcomes  Short Term: Participant verbalizes understanding of the signs/symptoms and immediate care of hyper/hypoglycemia, proper foot care and importance of medication, aerobic/resistive exercise and nutrition plan for blood glucose control.;Long Term: Attainment of HbA1C < 7%.    Hypertension  Yes    Intervention  Provide education on lifestyle modifcations including regular physical activity/exercise, weight management, moderate sodium restriction and increased consumption of fresh fruit, vegetables, and low fat dairy, alcohol moderation, and smoking cessation.;Monitor prescription use compliance.    Expected Outcomes  Short Term: Continued assessment and intervention until BP is < 140/63m HG in hypertensive participants. < 130/875mHG in hypertensive participants with diabetes, heart failure or chronic kidney disease.;Long Term: Maintenance of blood pressure at goal levels.    Lipids  Yes    Intervention  Provide education and support  for participant on nutrition & aerobic/resistive exercise along with prescribed medications to achieve LDL <70m, HDL >437m    Expected Outcomes  Short Term: Participant  states understanding of desired cholesterol values and is compliant with medications prescribed. Participant is following exercise prescription and nutrition guidelines.;Long Term: Cholesterol controlled with medications as prescribed, with individualized exercise RX and with personalized nutrition plan. Value goals: LDL < 7091mHDL > 40 mg.       Core Components/Risk Factors/Patient Goals Review:    Core Components/Risk Factors/Patient Goals at Discharge (Final Review):    ITP Comments: ITP Comments    Row Name 03/10/18 1249           ITP Comments  Med Review completed. Initial ITP created. Diagnosis can be found in Care Everywhere 9/3          Comments: Initial ITP

## 2018-03-18 ENCOUNTER — Encounter: Payer: Medicare Other | Attending: Cardiovascular Disease | Admitting: *Deleted

## 2018-03-18 DIAGNOSIS — Z79899 Other long term (current) drug therapy: Secondary | ICD-10-CM | POA: Diagnosis not present

## 2018-03-18 DIAGNOSIS — I213 ST elevation (STEMI) myocardial infarction of unspecified site: Secondary | ICD-10-CM | POA: Diagnosis not present

## 2018-03-18 DIAGNOSIS — Z9861 Coronary angioplasty status: Secondary | ICD-10-CM

## 2018-03-18 LAB — GLUCOSE, CAPILLARY
Glucose-Capillary: 179 mg/dL — ABNORMAL HIGH (ref 70–99)
Glucose-Capillary: 161 mg/dL — ABNORMAL HIGH (ref 70–99)

## 2018-03-18 NOTE — Progress Notes (Signed)
Daily Session Note  Patient Details  Name: Brandon Wall MRN: [id number] Date of Birth: 1940/12/28 Referring Provider:     Cardiac Rehab from 03/10/2018 in [facility]iac [facility]  Referring Provider  Chanetta Marshall MD      Encounter Date: 03/18/2018  Check In: Session Check In - 03/18/18 0928      Check-In   Supervising physician immediately available to respond to emergencies  See telemetry face sheet for immediately available ER MD    Location  [facility]-Cardiac & Pulmonary Rehab    Staff Present  Heath Lark, RN, BSN, CCRP;Jessica Emerson, MA, RCEP, CCRP, Exercise Physiologist;Amanda Oletta Darter, [place], ACSM CEP, Exercise Physiologist    Medication changes reported      No    Fall or balance concerns reported     No    Warm-up and Cool-down  Performed on first and last piece of equipment    Resistance Training Performed  Yes    VAD Patient?  No    PAD/SET Patient?  No      Pain Assessment   Currently in Pain?  No/denies          Social History   Tobacco Use  Smoking Status Never Smoker  Smokeless Tobacco Never Used    Goals Met:  Exercise tolerated well Personal goals reviewed No report of cardiac concerns or symptoms Strength training completed today  Goals Unmet:  Not Applicable  Comments: First full day of exercise!  Patient was oriented to gym and equipment including functions, settings, policies, and procedures.  Patient's individual exercise prescription and treatment plan were reviewed.  All starting workloads were established based on the results of the 6 minute walk test done at initial orientation visit.  The plan for exercise progression was also introduced and progression will be customized based on patient's performance and goals.    Dr. Emily Filbert is Medical Director for [place] and [name].

## 2018-03-19 ENCOUNTER — Encounter: Payer: Self-pay | Admitting: *Deleted

## 2018-03-19 DIAGNOSIS — I213 ST elevation (STEMI) myocardial infarction of unspecified site: Secondary | ICD-10-CM

## 2018-03-19 NOTE — Progress Notes (Signed)
Cardiac Individual Treatment Plan  Patient Details  Name: Brandon Wall MRN: [id number] Date of Birth: 1941-05-23 Referring Provider:     Cardiac Rehab from 03/10/2018 in [facility] Cardiac and Pulmonary Rehab  Referring Provider  Chanetta Marshall MD      Initial Encounter Date:    Cardiac Rehab from 03/10/2018 in [facility] Cardiac and Pulmonary Rehab  Date  03/10/18      Visit Diagnosis: ST elevation myocardial infarction (STEMI), unspecified artery ([place])  Patient's Home Medications on Admission:  Current Outpatient Medications:  .  apixaban (ELIQUIS) 5 MG TABS tablet, TAKE 1 TABLET BY MOUTH TWICE DAILY, Disp: , Rfl:  .  aspirin 81 MG chewable tablet, Chew by mouth., Disp: , Rfl:  .  clopidogrel (PLAVIX) 75 MG tablet, , Disp: , Rfl: 0 .  glipiZIDE (GLUCOTROL) 5 MG tablet, Take by mouth., Disp: , Rfl:  .  glucose blood test strip, , Disp: , Rfl:  .  isosorbide mononitrate (IMDUR) 30 MG 24 hr tablet, Take by mouth., Disp: , Rfl:  .  JARDIANCE 25 MG TABS tablet, , Disp: , Rfl:  .  ketoconazole (NIZORAL) 2 % cream, , Disp: , Rfl:  .  losartan (COZAAR) 50 MG tablet, 50 mg daily. , Disp: , Rfl:  .  metFORMIN (GLUCOPHAGE) 1000 MG tablet, Take 2,000 mg by mouth 2 (two) times daily with a meal. , Disp: , Rfl:  .  metoprolol succinate (TOPROL-XL) 100 MG 24 hr tablet, Take 50 mg by mouth. , Disp: , Rfl:  .  nitroGLYCERIN (NITROSTAT) 0.4 MG SL tablet, Place under the tongue., Disp: , Rfl:  .  pantoprazole (PROTONIX) 40 MG tablet, Take by mouth., Disp: , Rfl:  .  rosuvastatin (CRESTOR) 40 MG tablet, Take by mouth., Disp: , Rfl:  .  sotalol (BETAPACE) 80 MG tablet, TK 1 T PO BID, Disp: , Rfl: 2 .  spironolactone (ALDACTONE) 25 MG tablet, TK SS T PO ONCE D, Disp: , Rfl: 10  Past Medical History: No past medical history on file.  Tobacco Use: Social History   Tobacco Use  Smoking Status Never Smoker  Smokeless Tobacco Never Used    Labs: Recent Review Flowsheet Data    There is no flowsheet  data to display.       Exercise Target Goals: Exercise Program Goal: Individual exercise prescription set using results from initial 6 min walk test and THRR while considering  patient's activity barriers and safety.   Exercise Prescription Goal: Initial exercise prescription builds to 30-45 minutes a day of aerobic activity, 2-3 days per week.  Home exercise guidelines will be given to patient during program as part of exercise prescription that the participant will acknowledge.  Activity Barriers & Risk Stratification: Activity Barriers & Cardiac Risk Stratification - 03/10/18 1251      Activity Barriers & Cardiac Risk Stratification   Activity Barriers  Arthritis;Deconditioning;Muscular Weakness;Balance Concerns;Decreased Ventricular Function    Cardiac Risk Stratification  High       6 Minute Walk: 6 Minute Walk    Row Name 03/10/18 1250         6 Minute Walk   Phase  Initial     Distance  1468 feet     Walk Time  6 minutes     # of Rest Breaks  0     MPH  2.78     METS  2.64     RPE  12     VO2 Peak  9.26  Symptoms  No     Resting HR  58 bpm     Resting BP  126/64     Resting Oxygen Saturation   98 %     Exercise Oxygen Saturation  during 6 min walk  98 %     Max Ex. HR  81 bpm     Max Ex. BP  134/68     2 Minute Post BP  112/66        Oxygen Initial Assessment:   Oxygen Re-Evaluation:   Oxygen Discharge (Final Oxygen Re-Evaluation):   Initial Exercise Prescription: Initial Exercise Prescription - 03/10/18 1200      Date of Initial Exercise RX and Referring Provider   Date  03/10/18    Referring Provider  Chanetta Marshall MD      Treadmill   MPH  2.1    Grade  0.5    Minutes  15    METs  2.75      NuStep   Level  2    SPM  80    Minutes  15    METs  2.5      REL-XR   Level  1    Speed  50    Minutes  15    METs  2.5      Prescription Details   Frequency (times per week)  2    Duration  Progress to 30 minutes of continuous  aerobic without signs/symptoms of physical distress      Intensity   THRR 40-80% of Max Heartrate  92-125    Ratings of Perceived Exertion  11-13    Perceived Dyspnea  0-4      Progression   Progression  Continue to progress workloads to maintain intensity without signs/symptoms of physical distress.      Resistance Training   Training Prescription  Yes    Weight  3 lbs    Reps  10-15       Perform Capillary Blood Glucose checks as needed.  Exercise Prescription Changes: Exercise Prescription Changes    Row Name 03/10/18 1200             Response to Exercise   Blood Pressure (Admit)  126/64       Blood Pressure (Exercise)  134/68       Blood Pressure (Exit)  112/66       Heart Rate (Admit)  58 bpm       Heart Rate (Exercise)  81 bpm       Heart Rate (Exit)  50 bpm       Oxygen Saturation (Admit)  98 %       Oxygen Saturation (Exercise)  98 %       Rating of Perceived Exertion (Exercise)  12       Symptoms  none       Comments  walk test results          Exercise Comments: Exercise Comments    Row Name 03/18/18 0929           Exercise Comments  First full day of exercise!  Patient was oriented to gym and equipment including functions, settings, policies, and procedures.  Patient's individual exercise prescription and treatment plan were reviewed.  All starting workloads were established based on the results of the 6 minute walk test done at initial orientation visit.  The plan for exercise progression was also introduced and progression will be customized based on patient's performance and goals.  Exercise Goals and Review: Exercise Goals    Row Name 03/10/18 1301             Exercise Goals   Increase Physical Activity  Yes       Intervention  Provide advice, education, support and counseling about physical activity/exercise needs.;Develop an individualized exercise prescription for aerobic and resistive training based on initial evaluation  findings, risk stratification, comorbidities and participant's personal goals.       Expected Outcomes  Short Term: Attend rehab on a regular basis to increase amount of physical activity.;Long Term: Exercising regularly at least 3-5 days a week.;Long Term: Add in home exercise to make exercise part of routine and to increase amount of physical activity.       Increase Strength and Stamina  Yes       Intervention  Provide advice, education, support and counseling about physical activity/exercise needs.;Develop an individualized exercise prescription for aerobic and resistive training based on initial evaluation findings, risk stratification, comorbidities and participant's personal goals.       Expected Outcomes  Short Term: Increase workloads from initial exercise prescription for resistance, speed, and METs.;Short Term: Perform resistance training exercises routinely during rehab and add in resistance training at home;Long Term: Improve cardiorespiratory fitness, muscular endurance and strength as measured by increased METs and functional capacity (6MWT)       Able to understand and use rate of perceived exertion (RPE) scale  Yes       Intervention  Provide education and explanation on how to use RPE scale       Expected Outcomes  Short Term: Able to use RPE daily in rehab to express subjective intensity level;Long Term:  Able to use RPE to guide intensity level when exercising independently       Able to understand and use Dyspnea scale  Yes       Intervention  Provide education and explanation on how to use Dyspnea scale       Expected Outcomes  Short Term: Able to use Dyspnea scale daily in rehab to express subjective sense of shortness of breath during exertion;Long Term: Able to use Dyspnea scale to guide intensity level when exercising independently       Knowledge and understanding of Target Heart Rate Range (THRR)  Yes       Intervention  Provide education and explanation of THRR including how  the numbers were predicted and where they are located for reference       Expected Outcomes  Short Term: Able to state/look up THRR;Short Term: Able to use daily as guideline for intensity in rehab;Long Term: Able to use THRR to govern intensity when exercising independently       Able to check pulse independently  Yes       Intervention  Provide education and demonstration on how to check pulse in carotid and radial arteries.;Review the importance of being able to check your own pulse for safety during independent exercise       Expected Outcomes  Short Term: Able to explain why pulse checking is important during independent exercise;Long Term: Able to check pulse independently and accurately       Understanding of Exercise Prescription  Yes       Intervention  Provide education, explanation, and written materials on patient's individual exercise prescription       Expected Outcomes  Short Term: Able to explain program exercise prescription;Long Term: Able to explain home exercise prescription to exercise independently  Exercise Goals Re-Evaluation : Exercise Goals Re-Evaluation    [place] Name 03/18/18 0929             Exercise Goal Re-Evaluation   Exercise Goals Review  Increase Physical Activity;Increase Strength and Stamina;Able to understand and use rate of perceived exertion (RPE) scale;Knowledge and understanding of Target Heart Rate Range (THRR);Understanding of Exercise Prescription       Comments  Reviewed RPE scale, THR and program prescription with pt today.  Pt voiced understanding and was given a copy of goals to take home.        Expected Outcomes  Short: Use RPE daily to regulate intensity. Long: Follow program prescription in THR.          Discharge Exercise Prescription (Final Exercise Prescription Changes): Exercise Prescription Changes - 03/10/18 1200      Response to Exercise   Blood Pressure (Admit)  126/64    Blood Pressure (Exercise)  134/68    Blood  Pressure (Exit)  112/66    Heart Rate (Admit)  58 bpm    Heart Rate (Exercise)  81 bpm    Heart Rate (Exit)  50 bpm    Oxygen Saturation (Admit)  98 %    Oxygen Saturation (Exercise)  98 %    Rating of Perceived Exertion (Exercise)  12    Symptoms  none    Comments  walk test results       Nutrition:  Target Goals: Understanding of nutrition guidelines, daily intake of sodium <1516m, cholesterol <2033m calories 30% from fat and 7% or less from saturated fats, daily to have 5 or more servings of fruits and vegetables.  Biometrics: Pre Biometrics - 03/10/18 1302      Pre Biometrics   Height  6' 3.5" (1.918 m)    Weight  230 lb 1.6 oz (104.4 kg)    Waist Circumference  40 inches    Hip Circumference  43 inches    Waist to Hip Ratio  0.93 %    BMI (Calculated)  28.37    Single Leg Stand  5.73 seconds        Nutrition Therapy Plan and Nutrition Goals: Nutrition Therapy & Goals - 03/18/18 1040      Nutrition Therapy   Diet  DM/ TLC   following the Mediterranean diet   Protein (specify units)  12oz    Fiber  35 grams    Whole Grain Foods  3 servings   chooses whole grains such as brown rice and whole grain bread   Saturated Fats  15 max. grams    Fruits and Vegetables  8 servings/day   has increased fruit and vegetable intake over the past month dramaticlly   Sodium  1500 grams      Personal Nutrition Goals   Nutrition Goal  Continue to work on making recent dietary change to the Mediterranean style of eating a lifestyle habit. Great job so far!    Personal Goal #2  Continue to minimize red / processed meat intake, reduce added sugar intake and drink sugar free beverages    Personal Goal #3  When eating out, look for nutritious menu options that align with the [place]iter[place] style of eating most often    Comments  He and his wife have changed their eating patterns and style since his heart attack 4-5 weeks ago. They have been following a low sodium [place]iter[place]an diet and have  embraced this style of eating well. They have been eating out less, looking  for desserts with less added sugar IE yogurt + Fruit & Nuts, eaing less red meat, and eating more fruits/ vegetables/ whole grains. His wife has made the diet switch with him and his helping to prepare meals. He has been choosing eggs with wheat toast for breakfast most days. BG this week pre-prandial has been 100-120. His ultimate goal is to lower his A1C (previously 9.7).      Intervention Plan   Intervention  Prescribe, educate and counsel regarding individualized specific dietary modifications aiming towards targeted core components such as weight, hypertension, lipid management, diabetes, heart failure and other comorbidities.    Expected Outcomes  Short Term Goal: Understand basic principles of dietary content, such as calories, fat, sodium, cholesterol and nutrients.;Short Term Goal: A plan has been developed with personal nutrition goals set during dietitian appointment.;Long Term Goal: Adherence to prescribed nutrition plan.       Nutrition Assessments: Nutrition Assessments - 03/10/18 1100      MEDFICTS Scores   Pre Score  47       Nutrition Goals Re-Evaluation: Nutrition Goals Re-Evaluation    [place] Name 03/18/18 1057             Goals   Nutrition Goal  Continue to work on making recent dietary change to the [place] style of eating a lifestyle habit. Great job so far!       Comment  Since his heart attack 4-5 weeks ago he has adopted this way of eating as a completely new eating style. So far he feels that he can stick to the Mediterranean diet long-term and is learning to enjoy the foods and cooking at home more often       Expected Outcome  He will learn more about the [place] and experiment with recipes along with his wife to keep adherence high.         Personal Goal #2 Re-Evaluation   Personal Goal #2  Continue to minimize red/ processed meatintake, reduce added sugar intake, and  drink sugar free beverages         Personal Goal #3 Re-Evaluation   Personal Goal #3  When eating out, look for nutritious menu options that align with the [place]iterre[place] style of eating most often          Nutrition Goals Discharge (Final Nutrition Goals Re-Evaluation): Nutrition Goals Re-Evaluation - 03/18/18 1057      Goals   Nutrition Goal  Continue to work on making recent dietary change to the Mediterranean style of eating a lifestyle habit. Great job so far!    Comment  Since his heart attack 4-5 weeks ago he has adopted this way of eating as a completely new eating style. So far he feels that he can stick to the Mediterranean diet long-term and is learning to enjoy the foods and cooking at home more often    Expected Outcome  He will learn more about the [place] and experiment with recipes along with his wife to keep adherence high.      Personal Goal #2 Re-Evaluation   Personal Goal #2  Continue to minimize red/ processed meatintake, reduce added sugar intake, and drink sugar free beverages      Personal Goal #3 Re-Evaluation   Personal Goal #3  When eating out, look for nutritious menu options that align with the [place]iter[place] style of eating most often       Psychosocial: Target Goals: Acknowledge presence or absence of significant depression and/or stress, maximize coping  skills, provide positive support system. Participant is able to verbalize types and ability to use techniques and skills needed for reducing stress and depression.   Initial Review & Psychosocial Screening: Initial Psych Review & Screening - 03/10/18 1255      Initial Review   Current issues with  Current Stress Concerns    Source of Stress Concerns  Unable to perform yard/household activities    Comments  Rumi wants to build back up his stamina so he can feel better daily. He states he sleeps well at night, but is still sleepy sometimes during the day. He is socially active, whether its eating  breakfast with one of his sons or friends or playing golf weekly.       Family Dynamics   Good Support System?  Yes   spouse     Barriers   Psychosocial barriers to participate in program  There are no identifiable barriers or psychosocial needs.;The patient should benefit from training in stress management and relaxation.      Screening Interventions   Interventions  Encouraged to exercise;Program counselor consult    Expected Outcomes  Short Term goal: Utilizing psychosocial counselor, staff and physician to assist with identification of specific Stressors or current issues interfering with healing process. Setting desired goal for each stressor or current issue identified.;Long Term Goal: Stressors or current issues are controlled or eliminated.;Short Term goal: Identification and review with participant of any Quality of Life or Depression concerns found by scoring the questionnaire.;Long Term goal: The participant improves quality of Life and PHQ9 Scores as seen by post scores and/or verbalization of changes       Quality of Life Scores:  Quality of Life - 03/10/18 1258      Quality of Life   Select  Quality of Life      Quality of Life Scores   Health/Function Pre  22.8 %    Socioeconomic Pre  26.14 %    Psych/Spiritual Pre  27.43 %    Family Pre  25.2 %    GLOBAL Pre  24.79 %      Scores of 19 and below usually indicate a poorer quality of life in these areas.  A difference of  2-3 points is a clinically meaningful difference.  A difference of 2-3 points in the total score of the Quality of Life Index has been associated with significant improvement in overall quality of life, self-image, physical symptoms, and general health in studies assessing change in quality of life.  PHQ-9: Recent Review Flowsheet Data    Depression screen [facility] 2/9 03/10/2018 03/02/2015 11/29/2014   Decreased Interest 0 0 0   Down, Depressed, Hopeless 0 0 0   PHQ - 2 Score 0 0 0   Altered sleeping 0 1 -    Tired, decreased energy 3 1 -   Change in appetite 0 0 -   Feeling bad or failure about yourself  0 0 -   Trouble concentrating 0 0 -   Moving slowly or fidgety/restless 0 0 -   Suicidal thoughts 0 0 -   PHQ-9 Score 3 2 -   Difficult doing work/chores Not difficult at all Not difficult at all -     Interpretation of Total Score  Total Score Depression Severity:  1-4 = Minimal depression, 5-9 = Mild depression, 10-14 = Moderate depression, 15-19 = Moderately severe depression, 20-27 = Severe depression   Psychosocial Evaluation and Intervention:   Psychosocial Re-Evaluation:   Psychosocial Discharge (Final  Psychosocial Re-Evaluation):   Vocational Rehabilitation: Provide vocational rehab assistance to qualifying candidates.   Vocational Rehab Evaluation & Intervention: Vocational Rehab - 03/10/18 1255      Initial Vocational Rehab Evaluation & Intervention   Assessment shows need for Vocational Rehabilitation  No       Education: Education Goals: Education classes will be provided on a variety of topics geared toward better understanding of heart health and risk factor modification. Participant will state understanding/return demonstration of topics presented as noted by education test scores.  Learning Barriers/Preferences: Learning Barriers/Preferences - 03/10/18 1255      Learning Barriers/Preferences   Learning Barriers  None    Learning Preferences  None       Education Topics:  AED/CPR: - Group verbal and written instruction with the use of models to demonstrate the basic use of the AED with the basic ABC's of resuscitation.   General Nutrition Guidelines/Fats and Fiber: -Group instruction provided by verbal, written material, models and posters to present the general guidelines for heart healthy nutrition. Gives an explanation and review of dietary fats and fiber.   Cardiac Rehab from 01/05/2015 in [facility] Cardiac and Pulmonary Rehab  Date  11/29/14   Educator  C. Joneen Caraway, R  Instruction Review Code (retired)  2- meets goals/outcomes      Controlling Sodium/Reading Food Labels: -Group verbal and written material supporting the discussion of sodium use in heart healthy nutrition. Review and explanation with models, verbal and written materials for utilization of the food label.   Exercise Physiology & General Exercise Guidelines: - Group verbal and written instruction with models to review the exercise physiology of the cardiovascular system and associated critical values. Provides general exercise guidelines with specific guidelines to those with heart or lung disease.    Aerobic Exercise & Resistance Training: - Gives group verbal and written instruction on the various components of exercise. Focuses on aerobic and resistive training programs and the benefits of this training and how to safely progress through these programs..   Flexibility, Balance, Mind/Body Relaxation: Provides group verbal/written instruction on the benefits of flexibility and balance training, including mind/body exercise modes such as yoga, pilates and tai chi.  Demonstration and skill practice provided.   Cardiac Rehab from 03/18/2018 in [facility] Cardiac and Pulmonary Rehab  Date  03/18/18  Educator  AS  Instruction Review Code  1- Verbalizes Understanding      Stress and Anxiety: - Provides group verbal and written instruction about the health risks of elevated stress and causes of high stress.  Discuss the correlation between heart/lung disease and anxiety and treatment options. Review healthy ways to manage with stress and anxiety.   Depression: - Provides group verbal and written instruction on the correlation between heart/lung disease and depressed mood, treatment options, and the stigmas associated with seeking treatment.   Anatomy & Physiology of the Heart: - Group verbal and written instruction and models provide basic cardiac anatomy and  physiology, with the coronary electrical and arterial systems. Review of Valvular disease and Heart Failure   Cardiac Procedures: - Group verbal and written instruction to review commonly prescribed medications for heart disease. Reviews the medication, class of the drug, and side effects. Includes the steps to properly store meds and maintain the prescription regimen. (beta blockers and nitrates)   Cardiac Medications I: - Group verbal and written instruction to review commonly prescribed medications for heart disease. Reviews the medication, class of the drug, and side effects. Includes the steps to properly store  meds and maintain the prescription regimen.   Cardiac Medications II: -Group verbal and written instruction to review commonly prescribed medications for heart disease. Reviews the medication, class of the drug, and side effects. (all other drug classes)    Go Sex-Intimacy & Heart Disease, Get SMART - Goal Setting: - Group verbal and written instruction through game format to discuss heart disease and the return to sexual intimacy. Provides group verbal and written material to discuss and apply goal setting through the application of the S.M.A.R.T. Method.   Other Matters of the Heart: - Provides group verbal, written materials and models to describe Stable Angina and Peripheral Artery. Includes description of the disease process and treatment options available to the cardiac patient.   Exercise & Equipment Safety: - Individual verbal instruction and demonstration of equipment use and safety with use of the equipment.   Cardiac Rehab from 03/18/2018 in [facility] Cardiac and Pulmonary Rehab  Date  03/10/18  Educator  [facility]  Instruction Review Code  1- Verbalizes Understanding      Infection Prevention: - Provides verbal and written material to individual with discussion of infection control including proper hand washing and proper equipment cleaning during exercise  session.   Falls Prevention: - Provides verbal and written material to individual with discussion of falls prevention and safety.   Cardiac Rehab from 03/18/2018 in [facility] Cardiac and Pulmonary Rehab  Date  03/10/18  Educator  [facility]  Instruction Review Code  1- Verbalizes Understanding      Diabetes: - Individual verbal and written instruction to review signs/symptoms of diabetes, desired ranges of glucose level fasting, after meals and with exercise. Acknowledge that pre and post exercise glucose checks will be done for 3 sessions at entry of program.   Cardiac Rehab from 03/18/2018 in [facility] Cardiac and Pulmonary Rehab  Date  03/10/18  Educator  [facility]  Instruction Review Code  1- Verbalizes Understanding      Know Your Numbers and Risk Factors: -Group verbal and written instruction about important numbers in your health.  Discussion of what are risk factors and how they play a role in the disease process.  Review of Cholesterol, Blood Pressure, Diabetes, and BMI and the role they play in your overall health.   Sleep Hygiene: -Provides group verbal and written instruction about how sleep can affect your health.  Define sleep hygiene, discuss sleep cycles and impact of sleep habits. Review good sleep hygiene tips.    Other: -Provides group and verbal instruction on various topics (see comments)   Knowledge Questionnaire Score: Knowledge Questionnaire Score - 03/10/18 1100      Knowledge Questionnaire Score   Pre Score  23/26   test reviewed with pt today      Core Components/Risk Factors/Patient Goals at Admission: Personal Goals and Risk Factors at Admission - 03/10/18 1253      Core Components/Risk Factors/Patient Goals on Admission    Weight Management  Yes;Weight Loss;Obesity    Intervention  Weight Management: Develop a combined nutrition and exercise program designed to reach desired caloric intake, while maintaining appropriate intake of nutrient and fiber, sodium and fats,  and appropriate energy expenditure required for the weight goal.;Weight Management: Provide education and appropriate resources to help participant work on and attain dietary goals.;Obesity: Provide education and appropriate resources to help participant work on and attain dietary goals.;Weight Management/Obesity: Establish reasonable short term and long term weight goals.    Admit Weight  230 lb (104.3 kg)    Goal Weight:  Short Term  225 lb (102.1 kg)    Goal Weight: Long Term  210 lb (95.3 kg)    Expected Outcomes  Short Term: Continue to assess and modify interventions until short term weight is achieved;Long Term: Adherence to nutrition and physical activity/exercise program aimed toward attainment of established weight goal;Understanding recommendations for meals to include 15-35% energy as protein, 25-35% energy from fat, 35-60% energy from carbohydrates, less than 244m of dietary cholesterol, 20-35 gm of total fiber daily;Understanding of distribution of calorie intake throughout the day with the consumption of 4-5 meals/snacks;Weight Loss: Understanding of general recommendations for a balanced deficit meal plan, which promotes 1-2 lb weight loss per week and includes a negative energy balance of [phone] kcal/d    Diabetes  Yes    Intervention  Provide education about signs/symptoms and action to take for hypo/hyperglycemia.;Provide education about proper nutrition, including hydration, and aerobic/resistive exercise prescription along with prescribed medications to achieve blood glucose in normal ranges: Fasting glucose 65-99 mg/dL    Expected Outcomes  Short Term: Participant verbalizes understanding of the signs/symptoms and immediate care of hyper/hypoglycemia, proper foot care and importance of medication, aerobic/resistive exercise and nutrition plan for blood glucose control.;Long Term: Attainment of HbA1C < 7%.    Hypertension  Yes    Intervention  Provide education on lifestyle  modifcations including regular physical activity/exercise, weight management, moderate sodium restriction and increased consumption of fresh fruit, vegetables, and low fat dairy, alcohol moderation, and smoking cessation.;Monitor prescription use compliance.    Expected Outcomes  Short Term: Continued assessment and intervention until BP is < 140/925mHG in hypertensive participants. < 130/8049mG in hypertensive participants with diabetes, heart failure or chronic kidney disease.;Long Term: Maintenance of blood pressure at goal levels.    Lipids  Yes    Intervention  Provide education and support for participant on nutrition & aerobic/resistive exercise along with prescribed medications to achieve LDL <53m38mDL >40mg73m Expected Outcomes  Short Term: Participant states understanding of desired cholesterol values and is compliant with medications prescribed. Participant is following exercise prescription and nutrition guidelines.;Long Term: Cholesterol controlled with medications as prescribed, with individualized exercise RX and with personalized nutrition plan. Value goals: LDL < 53mg,55m > 40 mg.       Core Components/Risk Factors/Patient Goals Review:    Core Components/Risk Factors/Patient Goals at Discharge (Final Review):    ITP Comments: ITP Comments    Row Name 03/10/18 1249 03/19/18 0636         ITP Comments  Med Review completed. Initial ITP created. Diagnosis can be found in Care Everywhere 9/3  30 day review.  Continue with ITP unless directed changes per Medical Director review.  New to program         Comments:

## 2018-03-20 ENCOUNTER — Encounter: Payer: Medicare Other | Admitting: *Deleted

## 2018-03-20 DIAGNOSIS — I213 ST elevation (STEMI) myocardial infarction of unspecified site: Secondary | ICD-10-CM | POA: Diagnosis not present

## 2018-03-20 DIAGNOSIS — Z9861 Coronary angioplasty status: Secondary | ICD-10-CM

## 2018-03-20 LAB — GLUCOSE, CAPILLARY
Glucose-Capillary: 137 mg/dL — ABNORMAL HIGH (ref 70–99)
Glucose-Capillary: 128 mg/dL — ABNORMAL HIGH (ref 70–99)

## 2018-03-20 NOTE — Progress Notes (Signed)
Daily Session Note  Patient Details  Name: Brandon Wall MRN: [id number] Date of Birth: 01-Jul-1940 Referring Provider:     Cardiac Rehab from 03/10/2018 in [facility] Cardiac and Pulmonary Rehab  Referring Provider  Chanetta Marshall MD      Encounter Date: 03/20/2018  Check In: Session Check In - 03/20/18 1250      Check-In   Supervising physician immediately available to respond to emergencies  See telemetry face sheet for immediately available ER MD    Location  [facility]-Cardiac & Pulmonary Rehab    Staff Present  Vida Rigger RN, BSN;Carroll Enterkin, RN, Levie Heritage, MA, RCEP, CCRP, Exercise Physiologist;Joseph Tessie Fass RCP,RRT,BSRT    Medication changes reported      No    Fall or balance concerns reported     No    Warm-up and Cool-down  Performed on first and last piece of equipment    Resistance Training Performed  Yes    VAD Patient?  No    PAD/SET Patient?  No      Pain Assessment   Currently in Pain?  No/denies          Social History   Tobacco Use  Smoking Status Never Smoker  Smokeless Tobacco Never Used    Goals Met:  Exercise tolerated well No report of cardiac concerns or symptoms Strength training completed today  Goals Unmet:  Not Applicable  Comments: Pt able to follow exercise prescription today without complaint.  Will continue to monitor for progression. Arrived late after education today   Dr. Emily Filbert is Medical Director for [place] and [name].

## 2018-03-25 ENCOUNTER — Encounter: Payer: Medicare Other | Admitting: *Deleted

## 2018-03-25 DIAGNOSIS — I213 ST elevation (STEMI) myocardial infarction of unspecified site: Secondary | ICD-10-CM | POA: Diagnosis not present

## 2018-03-25 DIAGNOSIS — Z9861 Coronary angioplasty status: Secondary | ICD-10-CM

## 2018-03-25 LAB — GLUCOSE, CAPILLARY
GLUCOSE-CAPILLARY: 145 mg/dL — AB (ref 70–99)
Glucose-Capillary: 108 mg/dL — ABNORMAL HIGH (ref 70–99)

## 2018-03-25 NOTE — Progress Notes (Signed)
Daily Session Note  Patient Details  Name: Brandon Wall MRN: [id number] Date of Birth: Apr 12, 1941 Referring Provider:     Cardiac Rehab from 03/10/2018 in [facility] Cardiac and Pulmonary Rehab  Referring Provider  Chanetta Marshall MD      Encounter Date: 03/25/2018  Check In: Session Check In - 03/25/18 1035      Check-In   Supervising physician immediately available to respond to emergencies  See telemetry face sheet for immediately available ER MD    Location  [facility]-Cardiac & Pulmonary Rehab    Staff Present  Nyoka Cowden, RN, BSN, Willette Pa, MA, RCEP, CCRP, Exercise Physiologist;Krista Frederico Hamman, RN BSN    Medication changes reported      No    Fall or balance concerns reported     No    Warm-up and Cool-down  Performed on first and last piece of equipment    Resistance Training Performed  Yes    VAD Patient?  No    PAD/SET Patient?  No      Pain Assessment   Currently in Pain?  No/denies          Social History   Tobacco Use  Smoking Status Never Smoker  Smokeless Tobacco Never Used    Goals Met:  Independence with exercise equipment Exercise tolerated well No report of cardiac concerns or symptoms Strength training completed today  Goals Unmet:  Not Applicable  Comments: Pt able to follow exercise prescription today without complaint.  Will continue to monitor for progression. [name] has been to most of the education classes before, but we talked about making sure he comes in for some of them that are newer.    Dr. Emily Filbert is Medical Director for [place] and [name].

## 2018-03-27 ENCOUNTER — Encounter: Payer: Medicare Other | Admitting: *Deleted

## 2018-03-27 DIAGNOSIS — I213 ST elevation (STEMI) myocardial infarction of unspecified site: Secondary | ICD-10-CM | POA: Diagnosis not present

## 2018-03-27 DIAGNOSIS — Z9861 Coronary angioplasty status: Secondary | ICD-10-CM

## 2018-03-27 NOTE — Progress Notes (Signed)
Daily Session Note  Patient Details  Name: EBERT FORRESTER MRN: [id number] Date of Birth: 1940/10/08 Referring Provider:     Cardiac Rehab from 03/10/2018 in [facility] Cardiac and Pulmonary Rehab  Referring Provider  Chanetta Marshall MD      Encounter Date: 03/27/2018  Check In: Session Check In - 03/27/18 1002      Check-In   Supervising physician immediately available to respond to emergencies  See telemetry face sheet for immediately available ER MD    Location  [facility]-Cardiac & Pulmonary Rehab    Staff Present  Alberteen Sam, MA, RCEP, CCRP, Exercise Physiologist;Joseph Hood RCP,RRT,BSRT;Carroll Enterkin, Therapist, sports, BSN    Medication changes reported      No    Fall or balance concerns reported     No    Warm-up and Cool-down  Performed on first and last piece of equipment    Resistance Training Performed  Yes    VAD Patient?  No    PAD/SET Patient?  No      Pain Assessment   Currently in Pain?  No/denies          Social History   Tobacco Use  Smoking Status Never Smoker  Smokeless Tobacco Never Used    Goals Met:  Independence with exercise equipment Exercise tolerated well No report of cardiac concerns or symptoms Strength training completed today  Goals Unmet:  Not Applicable  Comments: Pt able to follow exercise prescription today without complaint.  Will continue to monitor for progression.  Reviewed home exercise with pt today.  Pt plans to walk and use treadmill at home for exercise.  Reviewed THR, pulse, RPE, sign and symptoms, and when to call 911 or MD.  Also discussed weather considerations and indoor options.  Pt voiced understanding.    Dr. Emily Filbert is Medical Director for [place] and [name].

## 2018-04-01 DIAGNOSIS — Z9861 Coronary angioplasty status: Secondary | ICD-10-CM

## 2018-04-01 DIAGNOSIS — I213 ST elevation (STEMI) myocardial infarction of unspecified site: Secondary | ICD-10-CM | POA: Diagnosis not present

## 2018-04-01 NOTE — Progress Notes (Signed)
Daily Session Note  Patient Details  Name: ISMEAL HEIDER MRN: [id number] Date of Birth: 20-Jul-1940 Referring Provider:     Cardiac Rehab from 03/10/2018 in [facility] Cardiac and Pulmonary Rehab  Referring Provider  Chanetta Marshall MD      Encounter Date: 04/01/2018  Check In: Session Check In - 04/01/18 0940      Check-In   Supervising physician immediately available to respond to emergencies  See telemetry face sheet for immediately available ER MD    Location  [facility]-Cardiac & Pulmonary Rehab    Staff Present  Heath Lark, RN, BSN, CCRP;Laureen Owens Shark, BS, RRT, Respiratory Lennie Hummer, MA, RCEP, CCRP, Exercise Physiologist    Medication changes reported      No    Fall or balance concerns reported     No    Warm-up and Cool-down  Performed on first and last piece of equipment    Resistance Training Performed  Yes    VAD Patient?  No    PAD/SET Patient?  No      Pain Assessment   Currently in Pain?  No/denies    Multiple Pain Sites  No          Social History   Tobacco Use  Smoking Status Never Smoker  Smokeless Tobacco Never Used    Goals Met:  Independence with exercise equipment Exercise tolerated well No report of cardiac concerns or symptoms Strength training completed today  Goals Unmet:  Not Applicable  Comments: Pt able to follow exercise prescription today without complaint.  Will continue to monitor for progression.   Dr. Emily Filbert is Medical Director for [place] and [name].

## 2018-04-08 ENCOUNTER — Encounter: Payer: Medicare Other | Admitting: *Deleted

## 2018-04-08 DIAGNOSIS — Z9861 Coronary angioplasty status: Secondary | ICD-10-CM

## 2018-04-08 DIAGNOSIS — I213 ST elevation (STEMI) myocardial infarction of unspecified site: Secondary | ICD-10-CM

## 2018-04-08 NOTE — Progress Notes (Signed)
Daily Session Note  Patient Details  Name: Brandon Wall MRN: [id number] Date of Birth: 1940/06/20 Referring Provider:     Cardiac Rehab from 03/10/2018 in [facility] Cardiac and Pulmonary Rehab  Referring Provider  Chanetta Marshall MD      Encounter Date: 04/08/2018  Check In: Session Check In - 04/08/18 0958      Check-In   Supervising physician immediately available to respond to emergencies  See telemetry face sheet for immediately available ER MD    Location  [facility]-Cardiac & Pulmonary Rehab    Staff Present  Justin Mend Lorre Nick, MA, RCEP, CCRP, Exercise Physiologist;Krista Frederico Hamman, RN BSN    Medication changes reported      No    Fall or balance concerns reported     No    Warm-up and Cool-down  Performed on first and last piece of equipment    Resistance Training Performed  Yes    VAD Patient?  No    PAD/SET Patient?  No      Pain Assessment   Currently in Pain?  No/denies          Social History   Tobacco Use  Smoking Status Never Smoker  Smokeless Tobacco Never Used    Goals Met:  Independence with exercise equipment Exercise tolerated well No report of cardiac concerns or symptoms Strength training completed today  Goals Unmet:  Not Applicable  Comments: Pt able to follow exercise prescription today without complaint.  Will continue to monitor for progression.    Dr. Emily Filbert is Medical Director for [place] and [name].

## 2018-04-10 DIAGNOSIS — I213 ST elevation (STEMI) myocardial infarction of unspecified site: Secondary | ICD-10-CM | POA: Diagnosis not present

## 2018-04-10 DIAGNOSIS — Z9861 Coronary angioplasty status: Secondary | ICD-10-CM

## 2018-04-10 NOTE — Progress Notes (Signed)
Daily Session Note  Patient Details  Name: Brandon Wall MRN: [id number] Date of Birth: Jan 21, 1941 Referring Provider:     Cardiac Rehab from 03/10/2018 in [facility] Cardiac and Pulmonary Rehab  Referring Provider  Chanetta Marshall MD      Encounter Date: 04/10/2018  Check In: Session Check In - 04/10/18 0919      Check-In   Supervising physician immediately available to respond to emergencies  See telemetry face sheet for immediately available ER MD    Location  [facility]-Cardiac & Pulmonary Rehab    Staff Present  Nyoka Cowden, RN, BSN, MA;Joseph Hood Lorre Nick, [place], RCEP, CCRP, Exercise Physiologist    Medication changes reported      No    Fall or balance concerns reported     No    Warm-up and Cool-down  Performed on first and last piece of equipment    Resistance Training Performed  Yes    VAD Patient?  No    PAD/SET Patient?  No      Pain Assessment   Currently in Pain?  No/denies    Multiple Pain Sites  No          Social History   Tobacco Use  Smoking Status Never Smoker  Smokeless Tobacco Never Used    Goals Met:  Independence with exercise equipment Exercise tolerated well No report of cardiac concerns or symptoms Strength training completed today  Goals Unmet:  Not Applicable  Comments: Pt able to follow exercise prescription today without complaint.  Will continue to monitor for progression.   Dr. Emily Filbert is Medical Director for [place] and [name].

## 2018-04-15 ENCOUNTER — Encounter: Payer: Medicare Other | Attending: Cardiovascular Disease

## 2018-04-15 DIAGNOSIS — Z79899 Other long term (current) drug therapy: Secondary | ICD-10-CM | POA: Insufficient documentation

## 2018-04-15 DIAGNOSIS — Z9861 Coronary angioplasty status: Secondary | ICD-10-CM

## 2018-04-15 DIAGNOSIS — I213 ST elevation (STEMI) myocardial infarction of unspecified site: Secondary | ICD-10-CM | POA: Insufficient documentation

## 2018-04-15 NOTE — Progress Notes (Signed)
Daily Session Note  Patient Details  Name: Brandon Wall MRN: [id number] Date of Birth: 05-Jul-1940 Referring Provider:     Cardiac Rehab from 03/10/2018 in [facility] Cardiac and Pulmonary Rehab  Referring Provider  Chanetta Marshall MD      Encounter Date: 04/15/2018  Check In: Session Check In - 04/15/18 0924      Check-In   Supervising physician immediately available to respond to emergencies  See telemetry face sheet for immediately available ER MD    Location  [facility]-Cardiac & Pulmonary Rehab    Staff Present  Heath Lark, RN, BSN, CCRP;Other;Milcah Dulany Rouse, MA, RCEP, CCRP, Exercise Physiologist;Amanda Oletta Darter, BA, ACSM CEP, Exercise Physiologist   Pryor Montes Durrell   Medication changes reported      No    Fall or balance concerns reported     No    Warm-up and Cool-down  Performed on first and last piece of equipment    Resistance Training Performed  Yes    VAD Patient?  No    PAD/SET Patient?  No      Pain Assessment   Currently in Pain?  No/denies          Social History   Tobacco Use  Smoking Status Never Smoker  Smokeless Tobacco Never Used    Goals Met:  Independence with exercise equipment Exercise tolerated well No report of cardiac concerns or symptoms Strength training completed today  Goals Unmet:  Not Applicable  Comments: Pt able to follow exercise prescription today without complaint.  Will continue to monitor for progression.    Dr. Emily Filbert is Medical Director for [place] and [name].

## 2018-04-16 ENCOUNTER — Encounter: Payer: Self-pay | Admitting: *Deleted

## 2018-04-16 DIAGNOSIS — I213 ST elevation (STEMI) myocardial infarction of unspecified site: Secondary | ICD-10-CM

## 2018-04-16 NOTE — Progress Notes (Signed)
Cardiac Individual Treatment Plan  Patient Details  Name: Brandon Wall MRN: [id number] Date of Birth: 1941-05-23 Referring Provider:     Cardiac Rehab from 03/10/2018 in [facility] Cardiac and Pulmonary Rehab  Referring Provider  Chanetta Marshall MD      Initial Encounter Date:    Cardiac Rehab from 03/10/2018 in [facility] Cardiac and Pulmonary Rehab  Date  03/10/18      Visit Diagnosis: ST elevation myocardial infarction (STEMI), unspecified artery ([place])  Patient's Home Medications on Admission:  Current Outpatient Medications:  .  apixaban (ELIQUIS) 5 MG TABS tablet, TAKE 1 TABLET BY MOUTH TWICE DAILY, Disp: , Rfl:  .  aspirin 81 MG chewable tablet, Chew by mouth., Disp: , Rfl:  .  clopidogrel (PLAVIX) 75 MG tablet, , Disp: , Rfl: 0 .  glipiZIDE (GLUCOTROL) 5 MG tablet, Take by mouth., Disp: , Rfl:  .  glucose blood test strip, , Disp: , Rfl:  .  isosorbide mononitrate (IMDUR) 30 MG 24 hr tablet, Take by mouth., Disp: , Rfl:  .  JARDIANCE 25 MG TABS tablet, , Disp: , Rfl:  .  ketoconazole (NIZORAL) 2 % cream, , Disp: , Rfl:  .  losartan (COZAAR) 50 MG tablet, 50 mg daily. , Disp: , Rfl:  .  metFORMIN (GLUCOPHAGE) 1000 MG tablet, Take 2,000 mg by mouth 2 (two) times daily with a meal. , Disp: , Rfl:  .  metoprolol succinate (TOPROL-XL) 100 MG 24 hr tablet, Take 50 mg by mouth. , Disp: , Rfl:  .  nitroGLYCERIN (NITROSTAT) 0.4 MG SL tablet, Place under the tongue., Disp: , Rfl:  .  pantoprazole (PROTONIX) 40 MG tablet, Take by mouth., Disp: , Rfl:  .  rosuvastatin (CRESTOR) 40 MG tablet, Take by mouth., Disp: , Rfl:  .  sotalol (BETAPACE) 80 MG tablet, TK 1 T PO BID, Disp: , Rfl: 2 .  spironolactone (ALDACTONE) 25 MG tablet, TK SS T PO ONCE D, Disp: , Rfl: 10  Past Medical History: No past medical history on file.  Tobacco Use: Social History   Tobacco Use  Smoking Status Never Smoker  Smokeless Tobacco Never Used    Labs: Recent Review Flowsheet Data    There is no flowsheet  data to display.       Exercise Target Goals: Exercise Program Goal: Individual exercise prescription set using results from initial 6 min walk test and THRR while considering  patient's activity barriers and safety.   Exercise Prescription Goal: Initial exercise prescription builds to 30-45 minutes a day of aerobic activity, 2-3 days per week.  Home exercise guidelines will be given to patient during program as part of exercise prescription that the participant will acknowledge.  Activity Barriers & Risk Stratification: Activity Barriers & Cardiac Risk Stratification - 03/10/18 1251      Activity Barriers & Cardiac Risk Stratification   Activity Barriers  Arthritis;Deconditioning;Muscular Weakness;Balance Concerns;Decreased Ventricular Function    Cardiac Risk Stratification  High       6 Minute Walk: 6 Minute Walk    Row Name 03/10/18 1250         6 Minute Walk   Phase  Initial     Distance  1468 feet     Walk Time  6 minutes     # of Rest Breaks  0     MPH  2.78     METS  2.64     RPE  12     VO2 Peak  9.26  Symptoms  No     Resting HR  58 bpm     Resting BP  126/64     Resting Oxygen Saturation   98 %     Exercise Oxygen Saturation  during 6 min walk  98 %     Max Ex. HR  81 bpm     Max Ex. BP  134/68     2 Minute Post BP  112/66        Oxygen Initial Assessment:   Oxygen Re-Evaluation:   Oxygen Discharge (Final Oxygen Re-Evaluation):   Initial Exercise Prescription: Initial Exercise Prescription - 03/10/18 1200      Date of Initial Exercise RX and Referring Provider   Date  03/10/18    Referring Provider  Chanetta Marshall MD      Treadmill   MPH  2.1    Grade  0.5    Minutes  15    METs  2.75      NuStep   Level  2    SPM  80    Minutes  15    METs  2.5      REL-XR   Level  1    Speed  50    Minutes  15    METs  2.5      Prescription Details   Frequency (times per week)  2    Duration  Progress to 30 minutes of continuous  aerobic without signs/symptoms of physical distress      Intensity   THRR 40-80% of Max Heartrate  92-125    Ratings of Perceived Exertion  11-13    Perceived Dyspnea  0-4      Progression   Progression  Continue to progress workloads to maintain intensity without signs/symptoms of physical distress.      Resistance Training   Training Prescription  Yes    Weight  3 lbs    Reps  10-15       Perform Capillary Blood Glucose checks as needed.  Exercise Prescription Changes: Exercise Prescription Changes    Row Name 03/10/18 1200 03/25/18 1100 03/27/18 1000         Response to Exercise   Blood Pressure (Admit)  126/64  118/64  -     Blood Pressure (Exercise)  134/68  138/62  -     Blood Pressure (Exit)  112/66  110/62  -     Heart Rate (Admit)  58 bpm  57 bpm  -     Heart Rate (Exercise)  81 bpm  118 bpm  -     Heart Rate (Exit)  50 bpm  67 bpm  -     Oxygen Saturation (Admit)  98 %  -  -     Oxygen Saturation (Exercise)  98 %  -  -     Rating of Perceived Exertion (Exercise)  12  13  -     Symptoms  none  none  -     Comments  walk test results  -  -     Duration  -  Continue with 30 min of aerobic exercise without signs/symptoms of physical distress.  -     Intensity  -  THRR unchanged  -       Progression   Progression  -  Continue to progress workloads to maintain intensity without signs/symptoms of physical distress.  -     Average METs  -  2.65  -  Resistance Training   Training Prescription  -  Yes  -     Weight  -  3 lbs  -     Reps  -  10-15  -       Interval Training   Interval Training  -  No  -       Treadmill   MPH  -  2.1  -     Grade  -  0.5  -     Minutes  -  15  -     METs  -  2.75  -       NuStep   Level  -  2  -     Minutes  -  15  -     METs  -  2.7  -       REL-XR   Level  -  1  -     Minutes  -  15  -     METs  -  2.5  -       Home Exercise Plan   Plans to continue exercise at  -  -  Home (comment) walking and treadmill      Frequency  -  -  Add 3 additional days to program exercise sessions.     Initial Home Exercises Provided  -  -  03/27/18        Exercise Comments: Exercise Comments    Row Name 03/18/18 0929           Exercise Comments  First full day of exercise!  Patient was oriented to gym and equipment including functions, settings, policies, and procedures.  Patient's individual exercise prescription and treatment plan were reviewed.  All starting workloads were established based on the results of the 6 minute walk test done at initial orientation visit.  The plan for exercise progression was also introduced and progression will be customized based on patient's performance and goals.          Exercise Goals and Review: Exercise Goals    Row Name 03/10/18 1301             Exercise Goals   Increase Physical Activity  Yes       Intervention  Provide advice, education, support and counseling about physical activity/exercise needs.;Develop an individualized exercise prescription for aerobic and resistive training based on initial evaluation findings, risk stratification, comorbidities and participant's personal goals.       Expected Outcomes  Short Term: Attend rehab on a regular basis to increase amount of physical activity.;Long Term: Exercising regularly at least 3-5 days a week.;Long Term: Add in home exercise to make exercise part of routine and to increase amount of physical activity.       Increase Strength and Stamina  Yes       Intervention  Provide advice, education, support and counseling about physical activity/exercise needs.;Develop an individualized exercise prescription for aerobic and resistive training based on initial evaluation findings, risk stratification, comorbidities and participant's personal goals.       Expected Outcomes  Short Term: Increase workloads from initial exercise prescription for resistance, speed, and METs.;Short Term: Perform resistance training exercises  routinely during rehab and add in resistance training at home;Long Term: Improve cardiorespiratory fitness, muscular endurance and strength as measured by increased METs and functional capacity (6MWT)       Able to understand and use rate of perceived exertion (RPE) scale  Yes  Intervention  Provide education and explanation on how to use RPE scale       Expected Outcomes  Short Term: Able to use RPE daily in rehab to express subjective intensity level;Long Term:  Able to use RPE to guide intensity level when exercising independently       Able to understand and use Dyspnea scale  Yes       Intervention  Provide education and explanation on how to use Dyspnea scale       Expected Outcomes  Short Term: Able to use Dyspnea scale daily in rehab to express subjective sense of shortness of breath during exertion;Long Term: Able to use Dyspnea scale to guide intensity level when exercising independently       Knowledge and understanding of Target Heart Rate Range (THRR)  Yes       Intervention  Provide education and explanation of THRR including how the numbers were predicted and where they are located for reference       Expected Outcomes  Short Term: Able to state/look up THRR;Short Term: Able to use daily as guideline for intensity in rehab;Long Term: Able to use THRR to govern intensity when exercising independently       Able to check pulse independently  Yes       Intervention  Provide education and demonstration on how to check pulse in carotid and radial arteries.;Review the importance of being able to check your own pulse for safety during independent exercise       Expected Outcomes  Short Term: Able to explain why pulse checking is important during independent exercise;Long Term: Able to check pulse independently and accurately       Understanding of Exercise Prescription  Yes       Intervention  Provide education, explanation, and written materials on patient's individual exercise  prescription       Expected Outcomes  Short Term: Able to explain program exercise prescription;Long Term: Able to explain home exercise prescription to exercise independently          Exercise Goals Re-Evaluation : Exercise Goals Re-Evaluation    Row Name 03/18/18 0929 03/25/18 1134 03/27/18 1003 04/08/18 1458       Exercise Goal Re-Evaluation   Exercise Goals Review  Increase Physical Activity;Increase Strength and Stamina;Able to understand and use rate of perceived exertion (RPE) scale;Knowledge and understanding of Target Heart Rate Range (THRR);Understanding of Exercise Prescription  Increase Physical Activity;Increase Strength and Stamina;Understanding of Exercise Prescription  Increase Physical Activity;Increase Strength and Stamina;Understanding of Exercise Prescription;Able to understand and use rate of perceived exertion (RPE) scale;Knowledge and understanding of Target Heart Rate Range (THRR);Able to check pulse independently  Increase Physical Activity;Increase Strength and Stamina;Able to check pulse independently    Comments  Reviewed RPE scale, THR and program prescription with pt today.  Pt voiced understanding and was given a copy of goals to take home.   Renly is off to a good start in rehab again.  He has been playing golf some on his off days, but we will need to talk about adding in home exercise.  He is up to 2.7 METs on the NuStep.  We will start to increase his workloads and monitor his progression.   Reviewed home exercise with pt today.  Pt plans to walk and use treadmill at home for exercise.  Reviewed THR, pulse, RPE, sign and symptoms, and when to call 911 or MD.  Also discussed weather considerations and indoor options.  Pt voiced understanding.  Russell continues to do well in rehab.  He is now up to level 5 on the XR and level 4 on the NuStep.  We will continue to monitor his progres.     Expected Outcomes  Short: Use RPE daily to regulate intensity. Long: Follow  program prescription in THR.  Short: Review home exercise guidelines.   Long: Continue to work on Printmaker and stamina.   Short: Start walking at least one extra day a week at home.  Long: Continue to exercise independently  Short: Continue to increase treadmill.  Long: Continue to increase strength and stamina.        Discharge Exercise Prescription (Final Exercise Prescription Changes): Exercise Prescription Changes - 03/27/18 1000      Home Exercise Plan   Plans to continue exercise at  Home (comment)   walking and treadmill   Frequency  Add 3 additional days to program exercise sessions.    Initial Home Exercises Provided  03/27/18       Nutrition:  Target Goals: Understanding of nutrition guidelines, daily intake of sodium '1500mg'$ , cholesterol '200mg'$ , calories 30% from fat and 7% or less from saturated fats, daily to have 5 or more servings of fruits and vegetables.  Biometrics: Pre Biometrics - 03/10/18 1302      Pre Biometrics   Height  6' 3.5" (1.918 m)    Weight  230 lb 1.6 oz (104.4 kg)    Waist Circumference  40 inches    Hip Circumference  43 inches    Waist to Hip Ratio  0.93 %    BMI (Calculated)  28.37    Single Leg Stand  5.73 seconds        Nutrition Therapy Plan and Nutrition Goals: Nutrition Therapy & Goals - 03/18/18 1040      Nutrition Therapy   Diet  DM/ TLC   following the Mediterranean diet   Protein (specify units)  12oz    Fiber  35 grams    Whole Grain Foods  3 servings   chooses whole grains such as brown rice and whole grain bread   Saturated Fats  15 max. grams    Fruits and Vegetables  8 servings/day   has increased fruit and vegetable intake over the past month dramaticlly   Sodium  1500 grams      Personal Nutrition Goals   Nutrition Goal  Continue to work on making recent dietary change to the Mediterranean style of eating a lifestyle habit. Great job so far!    Personal Goal #2  Continue to minimize red / processed meat  intake, reduce added sugar intake and drink sugar free beverages    Personal Goal #3  When eating out, look for nutritious menu options that align with the Mediterrean style of eating most often    Comments  He and his wife have changed their eating patterns and style since his heart attack 4-5 weeks ago. They have been following a low sodium Mediterrean diet and have embraced this style of eating well. They have been eating out less, looking for desserts with less added sugar IE yogurt + Fruit & Nuts, eaing less red meat, and eating more fruits/ vegetables/ whole grains. His wife has made the diet switch with him and his helping to prepare meals. He has been choosing eggs with wheat toast for breakfast most days. BG this week pre-prandial has been 100-120. His ultimate goal is to lower his A1C (previously 9.7).  Intervention Plan   Intervention  Prescribe, educate and counsel regarding individualized specific dietary modifications aiming towards targeted core components such as weight, hypertension, lipid management, diabetes, heart failure and other comorbidities.    Expected Outcomes  Short Term Goal: Understand basic principles of dietary content, such as calories, fat, sodium, cholesterol and nutrients.;Short Term Goal: A plan has been developed with personal nutrition goals set during dietitian appointment.;Long Term Goal: Adherence to prescribed nutrition plan.       Nutrition Assessments: Nutrition Assessments - 03/10/18 1100      MEDFICTS Scores   Pre Score  47       Nutrition Goals Re-Evaluation: Nutrition Goals Re-Evaluation    Row Name 03/18/18 1057 04/10/18 1018           Goals   Nutrition Goal  Continue to work on making recent dietary change to the Mediterranean style of eating a lifestyle habit. Great job so far!  Continue to work on lifestyle change of eating a Mediterranean-style diet both at home and when eating out      Comment  Since his heart attack 4-5 weeks ago  he has adopted this way of eating as a completely new eating style. So far he feels that he can stick to the Mediterranean diet long-term and is learning to enjoy the foods and cooking at home more often  He and his wife have been eating a Mediterranean-style diet since his heart attack and have been able to stick with it "easily." They are eating more fish and try to pick options when eating out that align with this style of eating as well. For example, his wife purchased a Mediterranean salad dressing that she brings to restaurants.      Expected Outcome  He will learn more about the [employer] and experiment with recipes along with his wife to keep adherence high.  He will continue to be successful in implementing this change in eating style        Personal Goal #2 Re-Evaluation   Personal Goal #2  Continue to minimize red/ processed meatintake, reduce added sugar intake, and drink sugar free beverages  -        Personal Goal #3 Re-Evaluation   Personal Goal #3  When eating out, look for nutritious menu options that align with the Mediterrean style of eating most often  -         Nutrition Goals Discharge (Final Nutrition Goals Re-Evaluation): Nutrition Goals Re-Evaluation - 04/10/18 1018      Goals   Nutrition Goal  Continue to work on lifestyle change of eating a Mediterranean-style diet both at home and when eating out    Comment  He and his wife have been eating a Mediterranean-style diet since his heart attack and have been able to stick with it "easily." They are eating more fish and try to pick options when eating out that align with this style of eating as well. For example, his wife purchased a Mediterranean salad dressing that she brings to restaurants.    Expected Outcome  He will continue to be successful in implementing this change in eating style       Psychosocial: Target Goals: Acknowledge presence or absence of significant depression and/or stress, maximize coping  skills, provide positive support system. Participant is able to verbalize types and ability to use techniques and skills needed for reducing stress and depression.   Initial Review & Psychosocial Screening: Initial Psych Review & Screening - 03/10/18 1255  Initial Review   Current issues with  Current Stress Concerns    Source of Stress Concerns  Unable to perform yard/household activities    Comments  Olan wants to build back up his stamina so he can feel better daily. He states he sleeps well at night, but is still sleepy sometimes during the day. He is socially active, whether its eating breakfast with one of his sons or friends or playing golf weekly.       Family Dynamics   Good Support System?  Yes   spouse     Barriers   Psychosocial barriers to participate in program  There are no identifiable barriers or psychosocial needs.;The patient should benefit from training in stress management and relaxation.      Screening Interventions   Interventions  Encouraged to exercise;Program counselor consult    Expected Outcomes  Short Term goal: Utilizing psychosocial counselor, staff and physician to assist with identification of specific Stressors or current issues interfering with healing process. Setting desired goal for each stressor or current issue identified.;Long Term Goal: Stressors or current issues are controlled or eliminated.;Short Term goal: Identification and review with participant of any Quality of Life or Depression concerns found by scoring the questionnaire.;Long Term goal: The participant improves quality of Life and PHQ9 Scores as seen by post scores and/or verbalization of changes       Quality of Life Scores:  Quality of Life - 03/10/18 1258      Quality of Life   Select  Quality of Life      Quality of Life Scores   Health/Function Pre  22.8 %    Socioeconomic Pre  26.14 %    Psych/Spiritual Pre  27.43 %    Family Pre  25.2 %    GLOBAL Pre  24.79 %       Scores of 19 and below usually indicate a poorer quality of life in these areas.  A difference of  2-3 points is a clinically meaningful difference.  A difference of 2-3 points in the total score of the Quality of Life Index has been associated with significant improvement in overall quality of life, self-image, physical symptoms, and general health in studies assessing change in quality of life.  PHQ-9: Recent Review Flowsheet Data    Depression screen [facility] 2/9 03/10/2018 03/02/2015 11/29/2014   Decreased Interest 0 0 0   Down, Depressed, Hopeless 0 0 0   PHQ - 2 Score 0 0 0   Altered sleeping 0 1 -   Tired, decreased energy 3 1 -   Change in appetite 0 0 -   Feeling bad or failure about yourself  0 0 -   Trouble concentrating 0 0 -   Moving slowly or fidgety/restless 0 0 -   Suicidal thoughts 0 0 -   PHQ-9 Score 3 2 -   Difficult doing work/chores Not difficult at all Not difficult at all -     Interpretation of Total Score  Total Score Depression Severity:  1-4 = Minimal depression, 5-9 = Mild depression, 10-14 = Moderate depression, 15-19 = Moderately severe depression, 20-27 = Severe depression   Psychosocial Evaluation and Intervention: Psychosocial Evaluation - 04/08/18 1006      Psychosocial Evaluation & Interventions   Interventions  Encouraged to exercise with the program and follow exercise prescription    Comments  Counselor met with Mr. Elvis Coil Tera Mater) today for initial psychosocial evaluation.  He is a 77 year old who has returned  to this program after a subsequent heart attack on 9/4 with stents inserted.  He reports being in good health otherwise except for diabetes and sleep apnea (which is helped with a CPAP).  Roxanne has a strong support system with a spouse of 69 years; (2) adult children locally and active involvement in his local church.  He sleeps well and has a good appetite.  Maverik denies a history of depression or anxiety or any current symptoms and his mood  is typically positive.  Other than his health, he reports having minimal stress in his life at this time.  He has goals to get his strength back and lose some weight while in this program.  Staff will follow with Cam throughout the course of this program.      Expected Outcomes  Short:  Agapito will get in a routine of exercise to increase his strength and stamina.  He will meet with the dietician to address his weight loss goal. Long:  Armin will develop a habit of positive self care with healthy eating and exercise being more consistent.     Continue Psychosocial Services   Follow up required by staff       Psychosocial Re-Evaluation:   Psychosocial Discharge (Final Psychosocial Re-Evaluation):   Vocational Rehabilitation: Provide vocational rehab assistance to qualifying candidates.   Vocational Rehab Evaluation & Intervention: Vocational Rehab - 03/10/18 1255      Initial Vocational Rehab Evaluation & Intervention   Assessment shows need for Vocational Rehabilitation  No       Education: Education Goals: Education classes will be provided on a variety of topics geared toward better understanding of heart health and risk factor modification. Participant will state understanding/return demonstration of topics presented as noted by education test scores.  Learning Barriers/Preferences: Learning Barriers/Preferences - 03/10/18 1255      Learning Barriers/Preferences   Learning Barriers  None    Learning Preferences  None       Education Topics:  AED/CPR: - Group verbal and written instruction with the use of models to demonstrate the basic use of the AED with the basic ABC's of resuscitation.   Cardiac Rehab from 04/15/2018 in [facility] Cardiac and Pulmonary Rehab  Date  04/10/18  Educator  SB  Instruction Review Code  1- Verbalizes Understanding      General Nutrition Guidelines/Fats and Fiber: -Group instruction provided by verbal, written material, models and posters  to present the general guidelines for heart healthy nutrition. Gives an explanation and review of dietary fats and fiber.   Cardiac Rehab from 04/15/2018 in [facility] Cardiac and Pulmonary Rehab  Date  04/15/18  Educator  LB  Instruction Review Code  1- Verbalizes Understanding      Controlling Sodium/Reading Food Labels: -Group verbal and written material supporting the discussion of sodium use in heart healthy nutrition. Review and explanation with models, verbal and written materials for utilization of the food label.   Exercise Physiology & General Exercise Guidelines: - Group verbal and written instruction with models to review the exercise physiology of the cardiovascular system and associated critical values. Provides general exercise guidelines with specific guidelines to those with heart or lung disease.    Aerobic Exercise & Resistance Training: - Gives group verbal and written instruction on the various components of exercise. Focuses on aerobic and resistive training programs and the benefits of this training and how to safely progress through these programs..   Flexibility, Balance, Mind/Body Relaxation: Provides group verbal/written instruction on the benefits  of flexibility and balance training, including mind/body exercise modes such as yoga, pilates and tai chi.  Demonstration and skill practice provided.   Cardiac Rehab from 04/15/2018 in [facility] Cardiac and Pulmonary Rehab  Date  03/18/18  Educator  AS  Instruction Review Code  1- Verbalizes Understanding      Stress and Anxiety: - Provides group verbal and written instruction about the health risks of elevated stress and causes of high stress.  Discuss the correlation between heart/lung disease and anxiety and treatment options. Review healthy ways to manage with stress and anxiety.   Depression: - Provides group verbal and written instruction on the correlation between heart/lung disease and depressed mood, treatment  options, and the stigmas associated with seeking treatment.   Anatomy & Physiology of the Heart: - Group verbal and written instruction and models provide basic cardiac anatomy and physiology, with the coronary electrical and arterial systems. Review of Valvular disease and Heart Failure   Cardiac Procedures: - Group verbal and written instruction to review commonly prescribed medications for heart disease. Reviews the medication, class of the drug, and side effects. Includes the steps to properly store meds and maintain the prescription regimen. (beta blockers and nitrates)   Cardiac Medications I: - Group verbal and written instruction to review commonly prescribed medications for heart disease. Reviews the medication, class of the drug, and side effects. Includes the steps to properly store meds and maintain the prescription regimen.   Cardiac Rehab from 04/15/2018 in [facility] Cardiac and Pulmonary Rehab  Date  04/01/18  Educator  SB  Instruction Review Code  1- Verbalizes Understanding      Cardiac Medications II: -Group verbal and written instruction to review commonly prescribed medications for heart disease. Reviews the medication, class of the drug, and side effects. (all other drug classes)    Go Sex-Intimacy & Heart Disease, Get SMART - Goal Setting: - Group verbal and written instruction through game format to discuss heart disease and the return to sexual intimacy. Provides group verbal and written material to discuss and apply goal setting through the application of the S.M.A.R.T. Method.   Other Matters of the Heart: - Provides group verbal, written materials and models to describe Stable Angina and Peripheral Artery. Includes description of the disease process and treatment options available to the cardiac patient.   Exercise & Equipment Safety: - Individual verbal instruction and demonstration of equipment use and safety with use of the equipment.   Cardiac Rehab from  04/15/2018 in [facility] Cardiac and Pulmonary Rehab  Date  03/10/18  Educator  [facility]  Instruction Review Code  1- Verbalizes Understanding      Infection Prevention: - Provides verbal and written material to individual with discussion of infection control including proper hand washing and proper equipment cleaning during exercise session.   Falls Prevention: - Provides verbal and written material to individual with discussion of falls prevention and safety.   Cardiac Rehab from 04/15/2018 in [facility] Cardiac and Pulmonary Rehab  Date  03/10/18  Educator  [facility]  Instruction Review Code  1- Verbalizes Understanding      Diabetes: - Individual verbal and written instruction to review signs/symptoms of diabetes, desired ranges of glucose level fasting, after meals and with exercise. Acknowledge that pre and post exercise glucose checks will be done for 3 sessions at entry of program.   Cardiac Rehab from 04/15/2018 in [facility] Cardiac and Pulmonary Rehab  Date  03/10/18  Educator  [facility]  Instruction Review Code  1- [employer]  Understanding      Know Your Numbers and Risk Factors: -Group verbal and written instruction about important numbers in your health.  Discussion of what are risk factors and how they play a role in the disease process.  Review of Cholesterol, Blood Pressure, Diabetes, and BMI and the role they play in your overall health.   Sleep Hygiene: -Provides group verbal and written instruction about how sleep can affect your health.  Define sleep hygiene, discuss sleep cycles and impact of sleep habits. Review good sleep hygiene tips.    Other: -Provides group and verbal instruction on various topics (see comments)   Knowledge Questionnaire Score: Knowledge Questionnaire Score - 03/10/18 1100      Knowledge Questionnaire Score   Pre Score  23/26   test reviewed with pt today      Core Components/Risk Factors/Patient Goals at Admission: Personal Goals and Risk Factors at Admission -  03/10/18 1253      Core Components/Risk Factors/Patient Goals on Admission    Weight Management  Yes;Weight Loss;Obesity    Intervention  Weight Management: Develop a combined nutrition and exercise program designed to reach desired caloric intake, while maintaining appropriate intake of nutrient and fiber, sodium and fats, and appropriate energy expenditure required for the weight goal.;Weight Management: Provide education and appropriate resources to help participant work on and attain dietary goals.;Obesity: Provide education and appropriate resources to help participant work on and attain dietary goals.;Weight Management/Obesity: Establish reasonable short term and long term weight goals.    Admit Weight  230 lb (104.3 kg)    Goal Weight: Short Term  225 lb (102.1 kg)    Goal Weight: Long Term  210 lb (95.3 kg)    Expected Outcomes  Short Term: Continue to assess and modify interventions until short term weight is achieved;Long Term: Adherence to nutrition and physical activity/exercise program aimed toward attainment of established weight goal;Understanding recommendations for meals to include 15-35% energy as protein, 25-35% energy from fat, 35-60% energy from carbohydrates, less than '200mg'$  of dietary cholesterol, 20-35 gm of total fiber daily;Understanding of distribution of calorie intake throughout the day with the consumption of 4-5 meals/snacks;Weight Loss: Understanding of general recommendations for a balanced deficit meal plan, which promotes 1-2 lb weight loss per week and includes a negative energy balance of [phone] kcal/d    Diabetes  Yes    Intervention  Provide education about signs/symptoms and action to take for hypo/hyperglycemia.;Provide education about proper nutrition, including hydration, and aerobic/resistive exercise prescription along with prescribed medications to achieve blood glucose in normal ranges: Fasting glucose 65-99 mg/dL    Expected Outcomes  Short Term:  Participant verbalizes understanding of the signs/symptoms and immediate care of hyper/hypoglycemia, proper foot care and importance of medication, aerobic/resistive exercise and nutrition plan for blood glucose control.;Long Term: Attainment of HbA1C < 7%.    Hypertension  Yes    Intervention  Provide education on lifestyle modifcations including regular physical activity/exercise, weight management, moderate sodium restriction and increased consumption of fresh fruit, vegetables, and low fat dairy, alcohol moderation, and smoking cessation.;Monitor prescription use compliance.    Expected Outcomes  Short Term: Continued assessment and intervention until BP is < 140/58m HG in hypertensive participants. < 130/875mHG in hypertensive participants with diabetes, heart failure or chronic kidney disease.;Long Term: Maintenance of blood pressure at goal levels.    Lipids  Yes    Intervention  Provide education and support for participant on nutrition & aerobic/resistive exercise along with prescribed medications to  achieve LDL '70mg'$ , HDL >'40mg'$ .    Expected Outcomes  Short Term: Participant states understanding of desired cholesterol values and is compliant with medications prescribed. Participant is following exercise prescription and nutrition guidelines.;Long Term: Cholesterol controlled with medications as prescribed, with individualized exercise RX and with personalized nutrition plan. Value goals: LDL < '70mg'$ , HDL > 40 mg.       Core Components/Risk Factors/Patient Goals Review:  Goals and Risk Factor Review    Row Name 03/25/18 1037             Core Components/Risk Factors/Patient Goals Review   Personal Goals Review  Weight Management/Obesity;Hypertension;Diabetes;Lipids       Review  Abdallah is off to a good start in rehab.  He is working to get his weight down to 210lbs at home.  Currently he is averaging around 215lbs.  He is doing well with his blood sugars at usually around 100-120s in the  morning.  He checks them every morning and usualy once in the afternoon or evening as well.   His has does not check his blood pressures at home as he does not have a cuff, but we talked about getting a new one to check at home.  He is doing well with his medications and has no problems.        Expected Outcomes  Short: Get new blood pressure cuff to check at home.  Long: Continue to work on weight loss.           Core Components/Risk Factors/Patient Goals at Discharge (Final Review):  Goals and Risk Factor Review - 03/25/18 1037      Core Components/Risk Factors/Patient Goals Review   Personal Goals Review  Weight Management/Obesity;Hypertension;Diabetes;Lipids    Review  Karron is off to a good start in rehab.  He is working to get his weight down to 210lbs at home.  Currently he is averaging around 215lbs.  He is doing well with his blood sugars at usually around 100-120s in the morning.  He checks them every morning and usualy once in the afternoon or evening as well.   His has does not check his blood pressures at home as he does not have a cuff, but we talked about getting a new one to check at home.  He is doing well with his medications and has no problems.     Expected Outcomes  Short: Get new blood pressure cuff to check at home.  Long: Continue to work on weight loss.        ITP Comments: ITP Comments    Row Name 03/10/18 1249 03/19/18 0636 03/25/18 1036 04/16/18 0602     ITP Comments  Med Review completed. Initial ITP created. Diagnosis can be found in Care Everywhere 9/3  30 day review.  Continue with ITP unless directed changes per Medical Director review.  New to program  Noal has been to most of the education classes before, but we talked about making sure he comes in for some of them that are newer.   30 day review. Continue with ITP unless direccted changes per Medical Director Chart Review.       Comments:

## 2018-04-17 ENCOUNTER — Encounter: Payer: Medicare Other | Admitting: *Deleted

## 2018-04-17 DIAGNOSIS — I213 ST elevation (STEMI) myocardial infarction of unspecified site: Secondary | ICD-10-CM

## 2018-04-17 DIAGNOSIS — Z9861 Coronary angioplasty status: Secondary | ICD-10-CM

## 2018-04-17 NOTE — Progress Notes (Signed)
Daily Session Note  Patient Details  Name: Brandon Wall MRN: [id number] Date of Birth: [birth year] Referring Provider:     Cardiac Rehab from 03/10/2018 in [facility] Cardiac and Pulmonary Rehab  Referring Provider  Chanetta Marshall MD      Encounter Date: 04/17/2018  Check In: Session Check In - 04/17/18 1020      Check-In   Supervising physician immediately available to respond to emergencies  See telemetry face sheet for immediately available ER MD    Location  [facility]-Cardiac & Pulmonary Rehab    Staff Present  Gerlene Burdock, RN, BSN;Other;Licet Dunphy Luan Pulling, MA, RCEP, CCRP, Exercise Physiologist;Joseph Konrad Dolores Durrell   Medication changes reported      No    Fall or balance concerns reported     No    Warm-up and Cool-down  Performed on first and last piece of equipment    Resistance Training Performed  Yes    VAD Patient?  No    PAD/SET Patient?  No      Pain Assessment   Currently in Pain?  No/denies          Social History   Tobacco Use  Smoking Status Never Smoker  Smokeless Tobacco Never Used    Goals Met:  Independence with exercise equipment Exercise tolerated well No report of cardiac concerns or symptoms Strength training completed today  Goals Unmet:  Not Applicable  Comments: Pt able to follow exercise prescription today without complaint.  Will continue to monitor for progression.    Dr. Emily Filbert is Medical Director for [place] and [name].

## 2018-04-24 ENCOUNTER — Encounter: Payer: Medicare Other | Admitting: *Deleted

## 2018-04-24 DIAGNOSIS — I213 ST elevation (STEMI) myocardial infarction of unspecified site: Secondary | ICD-10-CM

## 2018-04-24 DIAGNOSIS — Z9861 Coronary angioplasty status: Secondary | ICD-10-CM

## 2018-04-24 NOTE — Progress Notes (Signed)
Daily Session Note  Patient Details  Name: Brandon Wall MRN: [id number] Date of Birth: Jul 20, 1940 Referring Provider:     Cardiac Rehab from 03/10/2018 in [facility] Cardiac and Pulmonary Rehab  Referring Provider  Chanetta Marshall MD      Encounter Date: 04/24/2018  Check In: Session Check In - 04/24/18 0937      Check-In   Supervising physician immediately available to respond to emergencies  See telemetry face sheet for immediately available ER MD    Location  [facility]-Cardiac & Pulmonary Rehab    Staff Present  Alberteen Sam, MA, RCEP, CCRP, Exercise Physiologist;Joseph Hood RCP,RRT,BSRT;Carroll Enterkin, Therapist, sports, BSN    Medication changes reported      No    Fall or balance concerns reported     No    Warm-up and Cool-down  Performed on first and last piece of equipment    Resistance Training Performed  Yes    VAD Patient?  No    PAD/SET Patient?  No      Pain Assessment   Currently in Pain?  No/denies          Social History   Tobacco Use  Smoking Status Never Smoker  Smokeless Tobacco Never Used    Goals Met:  Independence with exercise equipment Exercise tolerated well No report of cardiac concerns or symptoms Strength training completed today  Goals Unmet:  Not Applicable  Comments: Pt able to follow exercise prescription today without complaint.  Will continue to monitor for progression.    Dr. Emily Filbert is Medical Director for [place] and [name].

## 2018-05-05 ENCOUNTER — Encounter: Payer: Medicare Other | Admitting: *Deleted

## 2018-05-05 DIAGNOSIS — I213 ST elevation (STEMI) myocardial infarction of unspecified site: Secondary | ICD-10-CM

## 2018-05-05 DIAGNOSIS — Z9861 Coronary angioplasty status: Secondary | ICD-10-CM

## 2018-05-05 NOTE — Progress Notes (Signed)
Daily Session Note  Patient Details  Name: Brandon Wall MRN: [id number] Date of Birth: 01-15-1941 Referring Provider:     Cardiac Rehab from 03/10/2018 in [facility] Cardiac and Pulmonary Rehab  Referring Provider  Chanetta Marshall MD      Encounter Date: 05/05/2018  Check In: Session Check In - 05/05/18 1745      Check-In   Supervising physician immediately available to respond to emergencies  See telemetry face sheet for immediately available ER MD    Location  [facility]-Cardiac & Pulmonary Rehab    Staff Present  Earlean Shawl, BS, ACSM CEP, Exercise Physiologist;Carroll Enterkin, RN, Vickki Hearing, BA, ACSM CEP, Exercise Physiologist    Medication changes reported      No    Fall or balance concerns reported     No    Tobacco Cessation  No Change    Warm-up and Cool-down  Performed as group-led instruction    Resistance Training Performed  Yes    VAD Patient?  No    PAD/SET Patient?  No      Pain Assessment   Currently in Pain?  No/denies    Multiple Pain Sites  No        Exercise Prescription Changes - 05/05/18 1600      Response to Exercise   Blood Pressure (Admit)  108/66    Blood Pressure (Exercise)  142/66    Blood Pressure (Exit)  108/66    Heart Rate (Admit)  67 bpm    Heart Rate (Exercise)  97 bpm    Heart Rate (Exit)  66 bpm    Rating of Perceived Exertion (Exercise)  12    Symptoms  none    Duration  Continue with 30 min of aerobic exercise without signs/symptoms of physical distress.    Intensity  THRR unchanged      Progression   Progression  Continue to progress workloads to maintain intensity without signs/symptoms of physical distress.    Average METs  3.21      Resistance Training   Training Prescription  Yes    Weight  3 lbs    Reps  10-15      Interval Training   Interval Training  No      Treadmill   MPH  2.5    Grade  0    Minutes  15    METs  2.91      REL-XR   Level  5    Minutes  15    METs  3.5      Home Exercise Plan   Plans to continue exercise at  Home (comment)   walking and treadmill   Frequency  Add 3 additional days to program exercise sessions.    Initial Home Exercises Provided  03/27/18       Social History   Tobacco Use  Smoking Status Never Smoker  Smokeless Tobacco Never Used    Goals Met:  Independence with exercise equipment Exercise tolerated well No report of cardiac concerns or symptoms Strength training completed today  Goals Unmet:  Not Applicable  Comments: Pt able to follow exercise prescription today without complaint.  Will continue to monitor for progression.    Dr. Emily Filbert is Medical Director for [place] and [name].

## 2018-05-13 ENCOUNTER — Encounter: Payer: Medicare Other | Attending: Cardiovascular Disease

## 2018-05-13 DIAGNOSIS — Z79899 Other long term (current) drug therapy: Secondary | ICD-10-CM | POA: Insufficient documentation

## 2018-05-13 DIAGNOSIS — I213 ST elevation (STEMI) myocardial infarction of unspecified site: Secondary | ICD-10-CM | POA: Insufficient documentation

## 2018-05-13 DIAGNOSIS — Z9861 Coronary angioplasty status: Secondary | ICD-10-CM

## 2018-05-13 NOTE — Progress Notes (Signed)
Daily Session Note  Patient Details  Name: Brandon Wall MRN: [id number] Date of Birth: Oct 17, 1940 Referring Provider:     Cardiac Rehab from 03/10/2018 in [facility] Cardiac and Pulmonary Rehab  Referring Provider  Chanetta Marshall MD      Encounter Date: 05/13/2018  Check In: Session Check In - 05/13/18 0947      Check-In   Supervising physician immediately available to respond to emergencies  See telemetry face sheet for immediately available ER MD    Location  [facility]-Cardiac & Pulmonary Rehab    Staff Present  Heath Lark, RN, BSN, CCRP;Jessica [place], MA, RCEP, CCRP, Exercise Physiologist;Izic Stfort BS, Exercise Physiologist    Medication changes reported      No    Fall or balance concerns reported     No    Tobacco Cessation  No Change    Warm-up and Cool-down  Performed as group-led instruction    Resistance Training Performed  Yes    VAD Patient?  No    PAD/SET Patient?  No      Pain Assessment   Currently in Pain?  No/denies    Multiple Pain Sites  No          Social History   Tobacco Use  Smoking Status Never Smoker  Smokeless Tobacco Never Used    Goals Met:  Independence with exercise equipment Exercise tolerated well Personal goals reviewed Strength training completed today  Goals Unmet:  Not Applicable  Comments: Pt able to follow exercise prescription today without complaint.  Will continue to monitor for progression.   Dr. Emily Filbert is Medical Director for [place] and [name].

## 2018-05-14 ENCOUNTER — Encounter: Payer: Self-pay | Admitting: *Deleted

## 2018-05-14 DIAGNOSIS — I213 ST elevation (STEMI) myocardial infarction of unspecified site: Secondary | ICD-10-CM

## 2018-05-14 DIAGNOSIS — Z9861 Coronary angioplasty status: Secondary | ICD-10-CM

## 2018-05-14 NOTE — Progress Notes (Signed)
Cardiac Individual Treatment Plan  Patient Details  Name: Brandon Wall MRN: [id number] Date of Birth: [birth year] Referring Provider:     Cardiac Rehab from 03/10/2018 in [facility] Cardiac and Pulmonary Rehab  Referring Provider  Chanetta Marshall MD      Initial Encounter Date:    Cardiac Rehab from 03/10/2018 in [facility] Cardiac and Pulmonary Rehab  Date  03/10/18      Visit Diagnosis: ST elevation myocardial infarction (STEMI), unspecified artery ([place])  S/P PTCA (percutaneous transluminal coronary angioplasty)  Patient's Home Medications on Admission:  Current Outpatient Medications:  .  apixaban (ELIQUIS) 5 MG TABS tablet, TAKE 1 TABLET BY MOUTH TWICE DAILY, Disp: , Rfl:  .  aspirin 81 MG chewable tablet, Chew by mouth., Disp: , Rfl:  .  clopidogrel (PLAVIX) 75 MG tablet, , Disp: , Rfl: 0 .  glipiZIDE (GLUCOTROL) 5 MG tablet, Take by mouth., Disp: , Rfl:  .  glucose blood test strip, , Disp: , Rfl:  .  isosorbide mononitrate (IMDUR) 30 MG 24 hr tablet, Take by mouth., Disp: , Rfl:  .  JARDIANCE 25 MG TABS tablet, , Disp: , Rfl:  .  ketoconazole (NIZORAL) 2 % cream, , Disp: , Rfl:  .  losartan (COZAAR) 50 MG tablet, 50 mg daily. , Disp: , Rfl:  .  metFORMIN (GLUCOPHAGE) 1000 MG tablet, Take 2,000 mg by mouth 2 (two) times daily with a meal. , Disp: , Rfl:  .  metoprolol succinate (TOPROL-XL) 100 MG 24 hr tablet, Take 50 mg by mouth. , Disp: , Rfl:  .  nitroGLYCERIN (NITROSTAT) 0.4 MG SL tablet, Place under the tongue., Disp: , Rfl:  .  pantoprazole (PROTONIX) 40 MG tablet, Take by mouth., Disp: , Rfl:  .  rosuvastatin (CRESTOR) 40 MG tablet, Take by mouth., Disp: , Rfl:  .  sotalol (BETAPACE) 80 MG tablet, TK 1 T PO BID, Disp: , Rfl: 2 .  spironolactone (ALDACTONE) 25 MG tablet, TK SS T PO ONCE D, Disp: , Rfl: 10  Past Medical History: No past medical history on file.  Tobacco Use: Social History   Tobacco Use  Smoking Status Never Smoker  Smokeless Tobacco Never Used     Labs: Recent Review Flowsheet Data    There is no flowsheet data to display.       Exercise Target Goals: Exercise Program Goal: Individual exercise prescription set using results from initial 6 min walk test and THRR while considering  patient's activity barriers and safety.   Exercise Prescription Goal: Initial exercise prescription builds to 30-45 minutes a day of aerobic activity, 2-3 days per week.  Home exercise guidelines will be given to patient during program as part of exercise prescription that the participant will acknowledge.  Activity Barriers & Risk Stratification: Activity Barriers & Cardiac Risk Stratification - 03/10/18 1251      Activity Barriers & Cardiac Risk Stratification   Activity Barriers  Arthritis;Deconditioning;Muscular Weakness;Balance Concerns;Decreased Ventricular Function    Cardiac Risk Stratification  High       6 Minute Walk: 6 Minute Walk    Row Name 03/10/18 1250         6 Minute Walk   Phase  Initial     Distance  1468 feet     Walk Time  6 minutes     # of Rest Breaks  0     MPH  2.78     METS  2.64     RPE  12  VO2 Peak  9.26     Symptoms  No     Resting HR  58 bpm     Resting BP  126/64     Resting Oxygen Saturation   98 %     Exercise Oxygen Saturation  during 6 min walk  98 %     Max Ex. HR  81 bpm     Max Ex. BP  134/68     2 Minute Post BP  112/66        Oxygen Initial Assessment:   Oxygen Re-Evaluation:   Oxygen Discharge (Final Oxygen Re-Evaluation):   Initial Exercise Prescription: Initial Exercise Prescription - 03/10/18 1200      Date of Initial Exercise RX and Referring Provider   Date  03/10/18    Referring Provider  Chanetta Marshall MD      Treadmill   MPH  2.1    Grade  0.5    Minutes  15    METs  2.75      NuStep   Level  2    SPM  80    Minutes  15    METs  2.5      REL-XR   Level  1    Speed  50    Minutes  15    METs  2.5      Prescription Details   Frequency (times  per week)  2    Duration  Progress to 30 minutes of continuous aerobic without signs/symptoms of physical distress      Intensity   THRR 40-80% of Max Heartrate  92-125    Ratings of Perceived Exertion  11-13    Perceived Dyspnea  0-4      Progression   Progression  Continue to progress workloads to maintain intensity without signs/symptoms of physical distress.      Resistance Training   Training Prescription  Yes    Weight  3 lbs    Reps  10-15       Perform Capillary Blood Glucose checks as needed.  Exercise Prescription Changes: Exercise Prescription Changes    Row Name 03/10/18 1200 03/25/18 1100 03/27/18 1000 04/22/18 1600 05/05/18 1600     Response to Exercise   Blood Pressure (Admit)  126/64  118/64  -  144/76  108/66   Blood Pressure (Exercise)  134/68  138/62  -  140/62  142/66   Blood Pressure (Exit)  112/66  110/62  -  120/64  108/66   Heart Rate (Admit)  58 bpm  57 bpm  -  62 bpm  67 bpm   Heart Rate (Exercise)  81 bpm  118 bpm  -  76 bpm  97 bpm   Heart Rate (Exit)  50 bpm  67 bpm  -  64 bpm  66 bpm   Oxygen Saturation (Admit)  98 %  -  -  -  -   Oxygen Saturation (Exercise)  98 %  -  -  -  -   Rating of Perceived Exertion (Exercise)  12  13  -  12  12   Symptoms  none  none  -  none  none   Comments  walk test results  -  -  -  -   Duration  -  Continue with 30 min of aerobic exercise without signs/symptoms of physical distress.  -  Continue with 30 min of aerobic exercise without signs/symptoms of physical distress.  Continue with 30 min  of aerobic exercise without signs/symptoms of physical distress.   Intensity  -  THRR unchanged  -  THRR unchanged  THRR unchanged     Progression   Progression  -  Continue to progress workloads to maintain intensity without signs/symptoms of physical distress.  -  Continue to progress workloads to maintain intensity without signs/symptoms of physical distress.  Continue to progress workloads to maintain intensity without  signs/symptoms of physical distress.   Average METs  -  2.65  -  2.87  3.21     Resistance Training   Training Prescription  -  Yes  -  Yes  Yes   Weight  -  3 lbs  -  3 lbs  3 lbs   Reps  -  10-15  -  10-15  10-15     Interval Training   Interval Training  -  No  -  No  No     Treadmill   MPH  -  2.1  -  2.5  2.5   Grade  -  0.5  -  0  0   Minutes  -  15  -  15  15   METs  -  2.75  -  2.91  2.91     NuStep   Level  -  2  -  4  -   Minutes  -  15  -  15  -   METs  -  2.7  -  3  -     REL-XR   Level  -  1  -  5  5   Minutes  -  15  -  15  15   METs  -  2.5  -  2.7  3.5     Home Exercise Plan   Plans to continue exercise at  -  -  Home (comment) walking and treadmill  Home (comment) walking and treadmill  Home (comment) walking and treadmill   Frequency  -  -  Add 3 additional days to program exercise sessions.  Add 3 additional days to program exercise sessions.  Add 3 additional days to program exercise sessions.   Initial Home Exercises Provided  -  -  03/27/18  03/27/18  03/27/18      Exercise Comments: Exercise Comments    Row Name 03/18/18 0929           Exercise Comments  First full day of exercise!  Patient was oriented to gym and equipment including functions, settings, policies, and procedures.  Patient's individual exercise prescription and treatment plan were reviewed.  All starting workloads were established based on the results of the 6 minute walk test done at initial orientation visit.  The plan for exercise progression was also introduced and progression will be customized based on patient's performance and goals.          Exercise Goals and Review: Exercise Goals    Row Name 03/10/18 1301             Exercise Goals   Increase Physical Activity  Yes       Intervention  Provide advice, education, support and counseling about physical activity/exercise needs.;Develop an individualized exercise prescription for aerobic and resistive training based on  initial evaluation findings, risk stratification, comorbidities and participant's personal goals.       Expected Outcomes  Short Term: Attend rehab on a regular basis to increase amount of physical activity.;Long Term: Exercising regularly at least  3-5 days a week.;Long Term: Add in home exercise to make exercise part of routine and to increase amount of physical activity.       Increase Strength and Stamina  Yes       Intervention  Provide advice, education, support and counseling about physical activity/exercise needs.;Develop an individualized exercise prescription for aerobic and resistive training based on initial evaluation findings, risk stratification, comorbidities and participant's personal goals.       Expected Outcomes  Short Term: Increase workloads from initial exercise prescription for resistance, speed, and METs.;Short Term: Perform resistance training exercises routinely during rehab and add in resistance training at home;Long Term: Improve cardiorespiratory fitness, muscular endurance and strength as measured by increased METs and functional capacity (6MWT)       Able to understand and use rate of perceived exertion (RPE) scale  Yes       Intervention  Provide education and explanation on how to use RPE scale       Expected Outcomes  Short Term: Able to use RPE daily in rehab to express subjective intensity level;Long Term:  Able to use RPE to guide intensity level when exercising independently       Able to understand and use Dyspnea scale  Yes       Intervention  Provide education and explanation on how to use Dyspnea scale       Expected Outcomes  Short Term: Able to use Dyspnea scale daily in rehab to express subjective sense of shortness of breath during exertion;Long Term: Able to use Dyspnea scale to guide intensity level when exercising independently       Knowledge and understanding of Target Heart Rate Range (THRR)  Yes       Intervention  Provide education and explanation of  THRR including how the numbers were predicted and where they are located for reference       Expected Outcomes  Short Term: Able to state/look up THRR;Short Term: Able to use daily as guideline for intensity in rehab;Long Term: Able to use THRR to govern intensity when exercising independently       Able to check pulse independently  Yes       Intervention  Provide education and demonstration on how to check pulse in carotid and radial arteries.;Review the importance of being able to check your own pulse for safety during independent exercise       Expected Outcomes  Short Term: Able to explain why pulse checking is important during independent exercise;Long Term: Able to check pulse independently and accurately       Understanding of Exercise Prescription  Yes       Intervention  Provide education, explanation, and written materials on patient's individual exercise prescription       Expected Outcomes  Short Term: Able to explain program exercise prescription;Long Term: Able to explain home exercise prescription to exercise independently          Exercise Goals Re-Evaluation : Exercise Goals Re-Evaluation    Row Name 03/18/18 0929 03/25/18 1134 03/27/18 1003 04/08/18 1458 04/22/18 1624     Exercise Goal Re-Evaluation   Exercise Goals Review  Increase Physical Activity;Increase Strength and Stamina;Able to understand and use rate of perceived exertion (RPE) scale;Knowledge and understanding of Target Heart Rate Range (THRR);Understanding of Exercise Prescription  Increase Physical Activity;Increase Strength and Stamina;Understanding of Exercise Prescription  Increase Physical Activity;Increase Strength and Stamina;Understanding of Exercise Prescription;Able to understand and use rate of perceived exertion (RPE) scale;Knowledge and understanding of  Target Heart Rate Range (THRR);Able to check pulse independently  Increase Physical Activity;Increase Strength and Stamina;Able to check pulse  independently  Increase Physical Activity;Increase Strength and Stamina;Able to check pulse independently   Comments  Reviewed RPE scale, THR and program prescription with pt today.  Pt voiced understanding and was given a copy of goals to take home.   Brandon Wall is off to a good start in rehab again.  He has been playing golf some on his off days, but we will need to talk about adding in home exercise.  He is up to 2.7 METs on the NuStep.  We will start to increase his workloads and monitor his progression.   Reviewed home exercise with pt today.  Pt plans to walk and use treadmill at home for exercise.  Reviewed THR, pulse, RPE, sign and symptoms, and when to call 911 or MD.  Also discussed weather considerations and indoor options.  Pt voiced understanding.  Brandon Wall continues to do well in rehab.  He is now up to level 5 on the XR and level 4 on the NuStep.  We will continue to monitor his progres.   Brandon Wall has been doing well in rehab.  He will need to add some incline to his treadmill.  We will continue to monitor his progression.   Expected Outcomes  Short: Use RPE daily to regulate intensity. Long: Follow program prescription in THR.  Short: Review home exercise guidelines.   Long: Continue to work on Printmaker and stamina.   Short: Start walking at least one extra day a week at home.  Long: Continue to exercise independently  Short: Continue to increase treadmill.  Long: Continue to increase strength and stamina.   Short: Add incline to treadmill.  Long: Continue to walk on off days.    Brandon Wall Name 05/05/18 1647 05/13/18 1016           Exercise Goal Re-Evaluation   Exercise Goals Review  Increase Physical Activity;Increase Strength and Stamina;Understanding of Exercise Prescription  Increase Physical Activity;Increase Strength and Stamina;Understanding of Exercise Prescription      Comments  Brandon Wall has been doing well in rehab.  He was out of town last week.  He still needs to add incline to  his treadmill as he has only attended once since last review.  We will continue to monitor his progress.   Brandon Wall has been doing well in rehab.  He made up a few classes in the afternoon.  He has not started to add in exercise as he has been busy.  He does play golf on Wednesday so is staying active.  He does have a treadmill at home but it is in the attic. He is going to work on getting it down from there..      Expected Outcomes  Short: Add incline to treadmill.  Long: Continue to walk on off days.   Short: Get treadmill out of attic.  Long: Add in home exercise at least one extra day a week.          Discharge Exercise Prescription (Final Exercise Prescription Changes): Exercise Prescription Changes - 05/05/18 1600      Response to Exercise   Blood Pressure (Admit)  108/66    Blood Pressure (Exercise)  142/66    Blood Pressure (Exit)  108/66    Heart Rate (Admit)  67 bpm    Heart Rate (Exercise)  97 bpm    Heart Rate (Exit)  66 bpm    Rating  of Perceived Exertion (Exercise)  12    Symptoms  none    Duration  Continue with 30 min of aerobic exercise without signs/symptoms of physical distress.    Intensity  THRR unchanged      Progression   Progression  Continue to progress workloads to maintain intensity without signs/symptoms of physical distress.    Average METs  3.21      Resistance Training   Training Prescription  Yes    Weight  3 lbs    Reps  10-15      Interval Training   Interval Training  No      Treadmill   MPH  2.5    Grade  0    Minutes  15    METs  2.91      REL-XR   Level  5    Minutes  15    METs  3.5      Home Exercise Plan   Plans to continue exercise at  Home (comment)   walking and treadmill   Frequency  Add 3 additional days to program exercise sessions.    Initial Home Exercises Provided  03/27/18       Nutrition:  Target Goals: Understanding of nutrition guidelines, daily intake of sodium <1547m, cholesterol <2070m calories 30% from fat  and 7% or less from saturated fats, daily to have 5 or more servings of fruits and vegetables.  Biometrics: Pre Biometrics - 03/10/18 1302      Pre Biometrics   Height  6' 3.5" (1.918 m)    Weight  230 lb 1.6 oz (104.4 kg)    Waist Circumference  40 inches    Hip Circumference  43 inches    Waist to Hip Ratio  0.93 %    BMI (Calculated)  28.37    Single Leg Stand  5.73 seconds        Nutrition Therapy Plan and Nutrition Goals: Nutrition Therapy & Goals - 03/18/18 1040      Nutrition Therapy   Diet  DM/ TLC   following the Mediterranean diet   Protein (specify units)  12oz    Fiber  35 grams    Whole Grain Foods  3 servings   chooses whole grains such as brown rice and whole grain bread   Saturated Fats  15 max. grams    Fruits and Vegetables  8 servings/day   has increased fruit and vegetable intake over the past month dramaticlly   Sodium  1500 grams      Personal Nutrition Goals   Nutrition Goal  Continue to work on making recent dietary change to the Mediterranean style of eating a lifestyle habit. Great job so far!    Personal Goal #2  Continue to minimize red / processed meat intake, reduce added sugar intake and drink sugar free beverages    Personal Goal #3  When eating out, look for nutritious menu options that align with the Mediterrean style of eating most often    Comments  He and his wife have changed their eating patterns and style since his heart attack 4-5 weeks ago. They have been following a low sodium Mediterrean diet and have embraced this style of eating well. They have been eating out less, looking for desserts with less added sugar IE yogurt + Fruit & Nuts, eaing less red meat, and eating more fruits/ vegetables/ whole grains. His wife has made the diet switch with him and his helping to prepare meals. He has  been choosing eggs with wheat toast for breakfast most days. BG this week pre-prandial has been 100-120. His ultimate goal is to lower his A1C  (previously 9.7).      Intervention Plan   Intervention  Prescribe, educate and counsel regarding individualized specific dietary modifications aiming towards targeted core components such as weight, hypertension, lipid management, diabetes, heart failure and other comorbidities.    Expected Outcomes  Short Term Goal: Understand basic principles of dietary content, such as calories, fat, sodium, cholesterol and nutrients.;Short Term Goal: A plan has been developed with personal nutrition goals set during dietitian appointment.;Long Term Goal: Adherence to prescribed nutrition plan.       Nutrition Assessments: Nutrition Assessments - 03/10/18 1100      MEDFICTS Scores   Pre Score  47       Nutrition Goals Re-Evaluation: Nutrition Goals Re-Evaluation    Row Name 03/18/18 1057 04/10/18 1018 05/13/18 1107         Goals   Nutrition Goal  Continue to work on making recent dietary change to the Mediterranean style of eating a lifestyle habit. Great job so far!  Continue to work on lifestyle change of eating a Mediterranean-style diet both at home and when eating out  Continue to work on the lifestyle-change of eating a Scientist, product/process development- style diet both at home and when eating out; Continue to minimize red meat / added sugar intake and to reduce sugar sweetened beverages     Comment  Since his heart attack 4-5 weeks ago he has adopted this way of eating as a completely new eating style. So far he feels that he can stick to the Mediterranean diet long-term and is learning to enjoy the foods and cooking at home more often  He and his wife have been eating a Mediterranean-style diet since his heart attack and have been able to stick with it "easily." They are eating more fish and try to pick options when eating out that align with this style of eating as well. For example, his wife purchased a Mediterranean salad dressing that she brings to restaurants.  He reports ability to maintain style of eating,  along with his wife. He "fell off" for Thanksgiving but other than that day has easily been able to stick with this lifestyle change     Expected Outcome  He will learn more about the [place] and experiment with recipes along with his wife to keep adherence high.  He will continue to be successful in implementing this change in eating style  Continue into the maintenance stage of the Mediteranean diet lifestyle change       Personal Goal #2 Re-Evaluation   Personal Goal #2  Continue to minimize red/ processed meatintake, reduce added sugar intake, and drink sugar free beverages  -  -       Personal Goal #3 Re-Evaluation   Personal Goal #3  When eating out, look for nutritious menu options that align with the Mediterrean style of eating most often  -  -        Nutrition Goals Discharge (Final Nutrition Goals Re-Evaluation): Nutrition Goals Re-Evaluation - 05/13/18 1107      Goals   Nutrition Goal  Continue to work on the lifestyle-change of eating a Scientist, product/process development- style diet both at home and when eating out; Continue to minimize red meat / added sugar intake and to reduce sugar sweetened beverages    Comment  He reports ability to maintain style of eating, along with  his wife. He "fell off" for Thanksgiving but other than that day has easily been able to stick with this lifestyle change    Expected Outcome  Continue into the maintenance stage of the Mediteranean diet lifestyle change       Psychosocial: Target Goals: Acknowledge presence or absence of significant depression and/or stress, maximize coping skills, provide positive support system. Participant is able to verbalize types and ability to use techniques and skills needed for reducing stress and depression.   Initial Review & Psychosocial Screening: Initial Psych Review & Screening - 03/10/18 1255      Initial Review   Current issues with  Current Stress Concerns    Source of Stress Concerns  Unable to perform  yard/household activities    Comments  Oddis wants to build back up his stamina so he can feel better daily. He states he sleeps well at night, but is still sleepy sometimes during the day. He is socially active, whether its eating breakfast with one of his sons or friends or playing golf weekly.       Family Dynamics   Good Support System?  Yes   spouse     Barriers   Psychosocial barriers to participate in program  There are no identifiable barriers or psychosocial needs.;The patient should benefit from training in stress management and relaxation.      Screening Interventions   Interventions  Encouraged to exercise;Program counselor consult    Expected Outcomes  Short Term goal: Utilizing psychosocial counselor, staff and physician to assist with identification of specific Stressors or current issues interfering with healing process. Setting desired goal for each stressor or current issue identified.;Long Term Goal: Stressors or current issues are controlled or eliminated.;Short Term goal: Identification and review with participant of any Quality of Life or Depression concerns found by scoring the questionnaire.;Long Term goal: The participant improves quality of Life and PHQ9 Scores as seen by post scores and/or verbalization of changes       Quality of Life Scores:  Quality of Life - 03/10/18 1258      Quality of Life   Select  Quality of Life      Quality of Life Scores   Health/Function Pre  22.8 %    Socioeconomic Pre  26.14 %    Psych/Spiritual Pre  27.43 %    Family Pre  25.2 %    GLOBAL Pre  24.79 %      Scores of 19 and below usually indicate a poorer quality of life in these areas.  A difference of  2-3 points is a clinically meaningful difference.  A difference of 2-3 points in the total score of the Quality of Life Index has been associated with significant improvement in overall quality of life, self-image, physical symptoms, and general health in studies assessing  change in quality of life.  PHQ-9: Recent Review Flowsheet Data    Depression screen [facility] 2/9 03/10/2018 03/02/2015 11/29/2014   Decreased Interest 0 0 0   Down, Depressed, Hopeless 0 0 0   PHQ - 2 Score 0 0 0   Altered sleeping 0 1 -   Tired, decreased energy 3 1 -   Change in appetite 0 0 -   Feeling bad or failure about yourself  0 0 -   Trouble concentrating 0 0 -   Moving slowly or fidgety/restless 0 0 -   Suicidal thoughts 0 0 -   PHQ-9 Score 3 2 -   Difficult doing work/chores  Not difficult at all Not difficult at all -     Interpretation of Total Score  Total Score Depression Severity:  1-4 = Minimal depression, 5-9 = Mild depression, 10-14 = Moderate depression, 15-19 = Moderately severe depression, 20-27 = Severe depression   Psychosocial Evaluation and Intervention: Psychosocial Evaluation - 04/08/18 1006      Psychosocial Evaluation & Interventions   Interventions  Encouraged to exercise with the program and follow exercise prescription    Comments  Counselor met with Mr. Elvis Coil Tera Mater) today for initial psychosocial evaluation.  He is a 77 year old who has returned to this program after a subsequent heart attack on 9/4 with stents inserted.  He reports being in good health otherwise except for diabetes and sleep apnea (which is helped with a CPAP).  Donat has a strong support system with a spouse of 21 years; (2) adult children locally and active involvement in his local church.  He sleeps well and has a good appetite.  Carols denies a history of depression or anxiety or any current symptoms and his mood is typically positive.  Other than his health, he reports having minimal stress in his life at this time.  He has goals to get his strength back and lose some weight while in this program.  Staff will follow with Younes throughout the course of this program.      Expected Outcomes  Short:  Daquarius will get in a routine of exercise to increase his strength and stamina.  He  will meet with the dietician to address his weight loss goal. Long:  Nicki will develop a habit of positive self care with healthy eating and exercise being more consistent.     Continue Psychosocial Services   Follow up required by staff       Psychosocial Re-Evaluation: Psychosocial Re-Evaluation    [place] Name 05/13/18 1020             Psychosocial Re-Evaluation   Current issues with  Current Stress Concerns       Comments  Jonmarc is in a good place mentally. He is staying busy with the holidays but still finds time to get out to play golf.  He was able to enjoy Thanksgiving and plans to do the same for Christmas and then get back to his diet quickly.  He is doing well with his diet even when he goes out with friends during the week.  He is sleeping good.  He has enjoyed coming to class regularly and getting back into a routine twice a week.        Expected Outcomes  Short: Continue to get in his round of golf for himself.  Long: Continue to stay postive.           Psychosocial Discharge (Final Psychosocial Re-Evaluation): Psychosocial Re-Evaluation - 05/13/18 1020      Psychosocial Re-Evaluation   Current issues with  Current Stress Concerns    Comments  Brandon Wall is in a good place mentally. He is staying busy with the holidays but still finds time to get out to play golf.  He was able to enjoy Thanksgiving and plans to do the same for Christmas and then get back to his diet quickly.  He is doing well with his diet even when he goes out with friends during the week.  He is sleeping good.  He has enjoyed coming to class regularly and getting back into a routine twice a week.     Expected  Outcomes  Short: Continue to get in his round of golf for himself.  Long: Continue to stay postive.        Vocational Rehabilitation: Provide vocational rehab assistance to qualifying candidates.   Vocational Rehab Evaluation & Intervention: Vocational Rehab - 03/10/18 1255      Initial  Vocational Rehab Evaluation & Intervention   Assessment shows need for Vocational Rehabilitation  No       Education: Education Goals: Education classes will be provided on a variety of topics geared toward better understanding of heart health and risk factor modification. Participant will state understanding/return demonstration of topics presented as noted by education test scores.  Learning Barriers/Preferences: Learning Barriers/Preferences - 03/10/18 1255      Learning Barriers/Preferences   Learning Barriers  None    Learning Preferences  None       Education Topics:  AED/CPR: - Group verbal and written instruction with the use of models to demonstrate the basic use of the AED with the basic ABC's of resuscitation.   Cardiac Rehab from 05/13/2018 in [facility] Cardiac and Pulmonary Rehab  Date  04/10/18  Educator  SB  Instruction Review Code  1- Verbalizes Understanding      General Nutrition Guidelines/Fats and Fiber: -Group instruction provided by verbal, written material, models and posters to present the general guidelines for heart healthy nutrition. Gives an explanation and review of dietary fats and fiber.   Cardiac Rehab from 05/13/2018 in [facility] Cardiac and Pulmonary Rehab  Date  04/15/18  Educator  LB  Instruction Review Code  1- Verbalizes Understanding      Controlling Sodium/Reading Food Labels: -Group verbal and written material supporting the discussion of sodium use in heart healthy nutrition. Review and explanation with models, verbal and written materials for utilization of the food label.   Cardiac Rehab from 05/13/2018 in [facility] Cardiac and Pulmonary Rehab  Date  04/17/18  Educator  LB  Instruction Review Code  1- Verbalizes Understanding      Exercise Physiology & General Exercise Guidelines: - Group verbal and written instruction with models to review the exercise physiology of the cardiovascular system and associated critical values. Provides general  exercise guidelines with specific guidelines to those with heart or lung disease.    Aerobic Exercise & Resistance Training: - Gives group verbal and written instruction on the various components of exercise. Focuses on aerobic and resistive training programs and the benefits of this training and how to safely progress through these programs..   Flexibility, Balance, Mind/Body Relaxation: Provides group verbal/written instruction on the benefits of flexibility and balance training, including mind/body exercise modes such as yoga, pilates and tai chi.  Demonstration and skill practice provided.   Cardiac Rehab from 05/13/2018 in [facility] Cardiac and Pulmonary Rehab  Date  05/05/18  Educator  AS  Instruction Review Code  5- Refused Teaching      Stress and Anxiety: - Provides group verbal and written instruction about the health risks of elevated stress and causes of high stress.  Discuss the correlation between heart/lung disease and anxiety and treatment options. Review healthy ways to manage with stress and anxiety.   Depression: - Provides group verbal and written instruction on the correlation between heart/lung disease and depressed mood, treatment options, and the stigmas associated with seeking treatment.   Anatomy & Physiology of the Heart: - Group verbal and written instruction and models provide basic cardiac anatomy and physiology, with the coronary electrical and arterial systems. Review of Valvular disease and  Heart Failure   Cardiac Procedures: - Group verbal and written instruction to review commonly prescribed medications for heart disease. Reviews the medication, class of the drug, and side effects. Includes the steps to properly store meds and maintain the prescription regimen. (beta blockers and nitrates)   Cardiac Medications I: - Group verbal and written instruction to review commonly prescribed medications for heart disease. Reviews the medication, class of the drug,  and side effects. Includes the steps to properly store meds and maintain the prescription regimen.   Cardiac Rehab from 05/13/2018 in [facility] Cardiac and Pulmonary Rehab  Date  04/01/18  Educator  SB  Instruction Review Code  1- Verbalizes Understanding      Cardiac Medications II: -Group verbal and written instruction to review commonly prescribed medications for heart disease. Reviews the medication, class of the drug, and side effects. (all other drug classes)   Cardiac Rehab from 05/13/2018 in [facility] Cardiac and Pulmonary Rehab  Date  05/13/18  Educator  SB  Instruction Review Code  5- Refused Teaching       Go Sex-Intimacy & Heart Disease, Get SMART - Goal Setting: - Group verbal and written instruction through game format to discuss heart disease and the return to sexual intimacy. Provides group verbal and written material to discuss and apply goal setting through the application of the S.M.A.R.T. Method.   Other Matters of the Heart: - Provides group verbal, written materials and models to describe Stable Angina and Peripheral Artery. Includes description of the disease process and treatment options available to the cardiac patient.   Exercise & Equipment Safety: - Individual verbal instruction and demonstration of equipment use and safety with use of the equipment.   Cardiac Rehab from 05/13/2018 in [facility] Cardiac and Pulmonary Rehab  Date  03/10/18  Educator  [facility]  Instruction Review Code  1- Verbalizes Understanding      Infection Prevention: - Provides verbal and written material to individual with discussion of infection control including proper hand washing and proper equipment cleaning during exercise session.   Falls Prevention: - Provides verbal and written material to individual with discussion of falls prevention and safety.   Cardiac Rehab from 05/13/2018 in [facility] Cardiac and Pulmonary Rehab  Date  03/10/18  Educator  [facility]  Instruction Review Code  1- Verbalizes  Understanding      Diabetes: - Individual verbal and written instruction to review signs/symptoms of diabetes, desired ranges of glucose level fasting, after meals and with exercise. Acknowledge that pre and post exercise glucose checks will be done for 3 sessions at entry of program.   Cardiac Rehab from 05/13/2018 in [facility] Cardiac and Pulmonary Rehab  Date  03/10/18  Educator  [facility]  Instruction Review Code  1- Verbalizes Understanding      Know Your Numbers and Risk Factors: -Group verbal and written instruction about important numbers in your health.  Discussion of what are risk factors and how they play a role in the disease process.  Review of Cholesterol, Blood Pressure, Diabetes, and BMI and the role they play in your overall health.   Cardiac Rehab from 05/13/2018 in [facility] Cardiac and Pulmonary Rehab  Date  05/13/18  Educator  SB  Instruction Review Code  5- Refused Teaching      Sleep Hygiene: -Provides group verbal and written instruction about how sleep can affect your health.  Define sleep hygiene, discuss sleep cycles and impact of sleep habits. Review good sleep hygiene tips.    Other: -Provides group  and verbal instruction on various topics (see comments)   Knowledge Questionnaire Score: Knowledge Questionnaire Score - 03/10/18 1100      Knowledge Questionnaire Score   Pre Score  23/26   test reviewed with pt today      Core Components/Risk Factors/Patient Goals at Admission: Personal Goals and Risk Factors at Admission - 03/10/18 1253      Core Components/Risk Factors/Patient Goals on Admission    Weight Management  Yes;Weight Loss;Obesity    Intervention  Weight Management: Develop a combined nutrition and exercise program designed to reach desired caloric intake, while maintaining appropriate intake of nutrient and fiber, sodium and fats, and appropriate energy expenditure required for the weight goal.;Weight Management: Provide education and appropriate  resources to help participant work on and attain dietary goals.;Obesity: Provide education and appropriate resources to help participant work on and attain dietary goals.;Weight Management/Obesity: Establish reasonable short term and long term weight goals.    Admit Weight  230 lb (104.3 kg)    Goal Weight: Short Term  225 lb (102.1 kg)    Goal Weight: Long Term  210 lb (95.3 kg)    Expected Outcomes  Short Term: Continue to assess and modify interventions until short term weight is achieved;Long Term: Adherence to nutrition and physical activity/exercise program aimed toward attainment of established weight goal;Understanding recommendations for meals to include 15-35% energy as protein, 25-35% energy from fat, 35-60% energy from carbohydrates, less than '200mg'$  of dietary cholesterol, 20-35 gm of total fiber daily;Understanding of distribution of calorie intake throughout the day with the consumption of 4-5 meals/snacks;Weight Loss: Understanding of general recommendations for a balanced deficit meal plan, which promotes 1-2 lb weight loss per week and includes a negative energy balance of [phone] kcal/d    Diabetes  Yes    Intervention  Provide education about signs/symptoms and action to take for hypo/hyperglycemia.;Provide education about proper nutrition, including hydration, and aerobic/resistive exercise prescription along with prescribed medications to achieve blood glucose in normal ranges: Fasting glucose 65-99 mg/dL    Expected Outcomes  Short Term: Participant verbalizes understanding of the signs/symptoms and immediate care of hyper/hypoglycemia, proper foot care and importance of medication, aerobic/resistive exercise and nutrition plan for blood glucose control.;Long Term: Attainment of HbA1C < 7%.    Hypertension  Yes    Intervention  Provide education on lifestyle modifcations including regular physical activity/exercise, weight management, moderate sodium restriction and increased  consumption of fresh fruit, vegetables, and low fat dairy, alcohol moderation, and smoking cessation.;Monitor prescription use compliance.    Expected Outcomes  Short Term: Continued assessment and intervention until BP is < 140/56m HG in hypertensive participants. < 130/856mHG in hypertensive participants with diabetes, heart failure or chronic kidney disease.;Long Term: Maintenance of blood pressure at goal levels.    Lipids  Yes    Intervention  Provide education and support for participant on nutrition & aerobic/resistive exercise along with prescribed medications to achieve LDL '70mg'$ , HDL >'40mg'$ .    Expected Outcomes  Short Term: Participant states understanding of desired cholesterol values and is compliant with medications prescribed. Participant is following exercise prescription and nutrition guidelines.;Long Term: Cholesterol controlled with medications as prescribed, with individualized exercise RX and with personalized nutrition plan. Value goals: LDL < '70mg'$ , HDL > 40 mg.       Core Components/Risk Factors/Patient Goals Review:  Goals and Risk Factor Review    Row Name 03/25/18 1037 05/13/18 1023           Core Components/Risk Factors/Patient  Goals Review   Personal Goals Review  Weight Management/Obesity;Hypertension;Diabetes;Lipids  Weight Management/Obesity;Hypertension;Diabetes;Lipids      Review  Travor is off to a good start in rehab.  He is working to get his weight down to 210lbs at home.  Currently he is averaging around 215lbs.  He is doing well with his blood sugars at usually around 100-120s in the morning.  He checks them every morning and usualy once in the afternoon or evening as well.   His has does not check his blood pressures at home as he does not have a cuff, but we talked about getting a new one to check at home.  He is doing well with his medications and has no problems.   [place]ville has been maintaining his weight for the most part. He is up a little today after  Thanksgiving.  He is around 225 lbs at home.  He is sure that it will come back down.  He continues to check his sugars regularly at home.  His A1c is down to 7.1 so he has better control.   He is still not checking his pressures at home and has not gotten a new cuff.  He feels comfortable with where his pressures are here and in the office.  He is still doing well with his medicaitons.       Expected Outcomes  Short: Get new blood pressure cuff to check at home.  Long: Continue to work on weight loss.   Short: Continue to check sugars regularly and get weight back down.  Long: Continue to work on weight loss.          Core Components/Risk Factors/Patient Goals at Discharge (Final Review):  Goals and Risk Factor Review - 05/13/18 1023      Core Components/Risk Factors/Patient Goals Review   Personal Goals Review  Weight Management/Obesity;Hypertension;Diabetes;Lipids    Review  Hilton has been maintaining his weight for the most part. He is up a little today after Thanksgiving.  He is around 225 lbs at home.  He is sure that it will come back down.  He continues to check his sugars regularly at home.  His A1c is down to 7.1 so he has better control.   He is still not checking his pressures at home and has not gotten a new cuff.  He feels comfortable with where his pressures are here and in the office.  He is still doing well with his medicaitons.     Expected Outcomes  Short: Continue to check sugars regularly and get weight back down.  Long: Continue to work on weight loss.        ITP Comments: ITP Comments    Row Name 03/10/18 1249 03/19/18 0636 03/25/18 1036 04/16/18 0602 05/14/18 0609   ITP Comments  Med Review completed. Initial ITP created. Diagnosis can be found in Care Everywhere 9/3  30 day review.  Continue with ITP unless directed changes per Medical Director review.  New to program  Alim has been to most of the education classes before, but we talked about making sure he comes in  for some of them that are newer.   30 day review. Continue with ITP unless direccted changes per Medical Director Chart Review.  30 day review. Continue with ITP unless direccted changes per Medical Director Chart Review.      Comments:

## 2018-05-15 ENCOUNTER — Encounter: Payer: Medicare Other | Admitting: *Deleted

## 2018-05-15 DIAGNOSIS — I213 ST elevation (STEMI) myocardial infarction of unspecified site: Secondary | ICD-10-CM

## 2018-05-15 DIAGNOSIS — Z9861 Coronary angioplasty status: Secondary | ICD-10-CM

## 2018-05-15 NOTE — Progress Notes (Signed)
Daily Session Note  Patient Details  Name: Brandon Wall MRN: [id number] Date of Birth: 01-Jan-1941 Referring Provider:     Cardiac Rehab from 03/10/2018 in [facility] Cardiac and Pulmonary Rehab  Referring Provider  Chanetta Marshall MD      Encounter Date: 05/15/2018  Check In: Session Check In - 05/15/18 0936      Check-In   Supervising physician immediately available to respond to emergencies  See telemetry face sheet for immediately available ER MD    Location  [facility]-Cardiac & Pulmonary Rehab    Staff Present  Alberteen Sam, MA, RCEP, CCRP, Exercise Physiologist;Christia Coaxum BS, Exercise Physiologist;Leslie Castrejon RN, BSN;Carroll Enterkin, RN, BSN;Joseph Hood RCP,RRT,BSRT    Medication changes reported      No    Fall or balance concerns reported     No    Tobacco Cessation  No Change    Warm-up and Cool-down  Performed as group-led Higher education careers adviser Performed  Yes    VAD Patient?  No    PAD/SET Patient?  No      Pain Assessment   Currently in Pain?  No/denies          Social History   Tobacco Use  Smoking Status Never Smoker  Smokeless Tobacco Never Used    Goals Met:  Independence with exercise equipment Exercise tolerated well No report of cardiac concerns or symptoms Strength training completed today  Goals Unmet:  Not Applicable  Comments: Pt able to follow exercise prescription today without complaint.  Will continue to monitor for progression.    Dr. Emily Filbert is Medical Director for [place] and [name].

## 2018-05-20 ENCOUNTER — Encounter: Payer: Medicare Other | Admitting: *Deleted

## 2018-05-20 DIAGNOSIS — I213 ST elevation (STEMI) myocardial infarction of unspecified site: Secondary | ICD-10-CM | POA: Diagnosis not present

## 2018-05-20 DIAGNOSIS — Z9861 Coronary angioplasty status: Secondary | ICD-10-CM

## 2018-05-20 NOTE — Progress Notes (Signed)
Daily Session Note  Patient Details  Name: Brandon Wall MRN: [id number] Date of Birth: 12/30/1940 Referring Provider:     Cardiac Rehab from 03/10/2018 in [facility] Cardiac and Pulmonary Rehab  Referring Provider  Chanetta Marshall MD      Encounter Date: 05/20/2018  Check In: Session Check In - 05/20/18 0949      Check-In   Supervising physician immediately available to respond to emergencies  See telemetry face sheet for immediately available ER MD    Location  [facility]-Cardiac & Pulmonary Rehab    Staff Present  Jasper Loser BS, Exercise Physiologist;Glynn Yepes Luan Pulling, MA, RCEP, CCRP, Exercise Physiologist;Joseph [place] [employer];Heath Lark, RN, BSN, CCRP    Medication changes reported      No    Fall or balance concerns reported     No    Warm-up and Cool-down  Performed as group-led instruction    Resistance Training Performed  Yes    VAD Patient?  No    PAD/SET Patient?  No      Pain Assessment   Currently in Pain?  No/denies          Social History   Tobacco Use  Smoking Status Never Smoker  Smokeless Tobacco Never Used    Goals Met:  Independence with exercise equipment Exercise tolerated well No report of cardiac concerns or symptoms Strength training completed today  Goals Unmet:  Not Applicable  Comments: Pt able to follow exercise prescription today without complaint.  Will continue to monitor for progression.    Dr. Emily Filbert is Medical Director for [place] and [name].

## 2018-05-22 DIAGNOSIS — I213 ST elevation (STEMI) myocardial infarction of unspecified site: Secondary | ICD-10-CM | POA: Diagnosis not present

## 2018-05-27 ENCOUNTER — Encounter: Payer: Medicare Other | Admitting: *Deleted

## 2018-05-27 DIAGNOSIS — I213 ST elevation (STEMI) myocardial infarction of unspecified site: Secondary | ICD-10-CM | POA: Diagnosis not present

## 2018-05-27 DIAGNOSIS — Z9861 Coronary angioplasty status: Secondary | ICD-10-CM

## 2018-05-27 NOTE — Progress Notes (Signed)
Daily Session Note  Patient Details  Name: Brandon Wall MRN: [id number] Date of Birth: 13-Nov-1940 Referring Provider:     Cardiac Rehab from 03/10/2018 in [facility] Cardiac and Pulmonary Rehab  Referring Provider  Chanetta Marshall MD      Encounter Date: 05/27/2018  Check In: Session Check In - 05/27/18 1032      Check-In   Supervising physician immediately available to respond to emergencies  See telemetry face sheet for immediately available ER MD    Location  [facility]-Cardiac & Pulmonary Rehab    Staff Present  Alberteen Sam, MA, RCEP, CCRP, Exercise Physiologist;Jeanna Durrell BS, Exercise Physiologist;Krista Frederico Hamman, RN BSN    Medication changes reported      No    Fall or balance concerns reported     No    Warm-up and Cool-down  Performed on first and last piece of equipment    Resistance Training Performed  No   arrived late for class   VAD Patient?  No    PAD/SET Patient?  No      Pain Assessment   Currently in Pain?  No/denies          Social History   Tobacco Use  Smoking Status Never Smoker  Smokeless Tobacco Never Used    Goals Met:  Independence with exercise equipment Exercise tolerated well No report of cardiac concerns or symptoms Strength training completed today  Goals Unmet:  Not Applicable  Comments: Pt able to follow exercise prescription today without complaint.  Will continue to monitor for progression.    Dr. Emily Filbert is Medical Director for [place] and [name].

## 2018-05-29 ENCOUNTER — Encounter: Payer: Medicare Other | Admitting: *Deleted

## 2018-05-29 DIAGNOSIS — I213 ST elevation (STEMI) myocardial infarction of unspecified site: Secondary | ICD-10-CM | POA: Diagnosis not present

## 2018-05-29 DIAGNOSIS — Z9861 Coronary angioplasty status: Secondary | ICD-10-CM

## 2018-05-29 NOTE — Progress Notes (Signed)
Daily Session Note  Patient Details  Name: Brandon Wall MRN: [id number] Date of Birth: 19-Apr-1941 Referring Provider:     Cardiac Rehab from 03/10/2018 in [facility] Cardiac and Pulmonary Rehab  Referring Provider  Chanetta Marshall MD      Encounter Date: 05/29/2018  Check In: Session Check In - 05/29/18 1027      Check-In   Supervising physician immediately available to respond to emergencies  See telemetry face sheet for immediately available ER MD    Location  [facility]-Cardiac & Pulmonary Rehab    Staff Present  Jasper Loser BS, Exercise Physiologist;Venida Tsukamoto Luan Pulling, MA, RCEP, CCRP, Exercise Physiologist;Carroll Enterkin, RN, BSN;Leslie Psychologist, prison and probation services, BSN;Joseph Hood RCP,RRT,BSRT    Medication changes reported      No    Fall or balance concerns reported     No    Warm-up and Cool-down  Performed as group-led Higher education careers adviser Performed  Yes    VAD Patient?  No    PAD/SET Patient?  No      Pain Assessment   Currently in Pain?  No/denies          Social History   Tobacco Use  Smoking Status Never Smoker  Smokeless Tobacco Never Used    Goals Met:  Independence with exercise equipment Exercise tolerated well No report of cardiac concerns or symptoms Strength training completed today  Goals Unmet:  Not Applicable  Comments: Pt able to follow exercise prescription today without complaint.  Will continue to monitor for progression.    Dr. Emily Filbert is Medical Director for [place] and [name].

## 2018-06-10 ENCOUNTER — Encounter: Payer: Self-pay | Admitting: *Deleted

## 2018-06-10 DIAGNOSIS — Z9861 Coronary angioplasty status: Secondary | ICD-10-CM

## 2018-06-10 DIAGNOSIS — I213 ST elevation (STEMI) myocardial infarction of unspecified site: Secondary | ICD-10-CM

## 2018-06-10 NOTE — Progress Notes (Signed)
Daily Session Note  Patient Details  Name: Brandon Wall MRN: [id number] Date of Birth: 12-20-1940 Referring Provider:     Cardiac Rehab from 03/10/2018 in [facility] Cardiac and Pulmonary Rehab  Referring Provider  Chanetta Marshall MD      Encounter Date: 06/10/2018  Check In:      Social History   Tobacco Use  Smoking Status Never Smoker  Smokeless Tobacco Never Used    Goals Met:  Independence with exercise equipment Exercise tolerated well No report of cardiac concerns or symptoms Strength training completed today  Goals Unmet:  Not Applicable  Comments: Pt able to follow exercise prescription today without complaint.  Will continue to monitor for progression.    Dr. Emily Filbert is Medical Director for [name] and [name].

## 2018-06-10 NOTE — Progress Notes (Signed)
Cardiac Individual Treatment Plan  Patient Details  Name: Brandon Wall MRN: [id number] Date of Birth: 1941-05-23 Referring Provider:     Cardiac Rehab from 03/10/2018 in [facility] Cardiac and Pulmonary Rehab  Referring Provider  Chanetta Marshall MD      Initial Encounter Date:    Cardiac Rehab from 03/10/2018 in [facility] Cardiac and Pulmonary Rehab  Date  03/10/18      Visit Diagnosis: ST elevation myocardial infarction (STEMI), unspecified artery ([place])  Patient's Home Medications on Admission:  Current Outpatient Medications:  .  apixaban (ELIQUIS) 5 MG TABS tablet, TAKE 1 TABLET BY MOUTH TWICE DAILY, Disp: , Rfl:  .  aspirin 81 MG chewable tablet, Chew by mouth., Disp: , Rfl:  .  clopidogrel (PLAVIX) 75 MG tablet, , Disp: , Rfl: 0 .  glipiZIDE (GLUCOTROL) 5 MG tablet, Take by mouth., Disp: , Rfl:  .  glucose blood test strip, , Disp: , Rfl:  .  isosorbide mononitrate (IMDUR) 30 MG 24 hr tablet, Take by mouth., Disp: , Rfl:  .  JARDIANCE 25 MG TABS tablet, , Disp: , Rfl:  .  ketoconazole (NIZORAL) 2 % cream, , Disp: , Rfl:  .  losartan (COZAAR) 50 MG tablet, 50 mg daily. , Disp: , Rfl:  .  metFORMIN (GLUCOPHAGE) 1000 MG tablet, Take 2,000 mg by mouth 2 (two) times daily with a meal. , Disp: , Rfl:  .  metoprolol succinate (TOPROL-XL) 100 MG 24 hr tablet, Take 50 mg by mouth. , Disp: , Rfl:  .  nitroGLYCERIN (NITROSTAT) 0.4 MG SL tablet, Place under the tongue., Disp: , Rfl:  .  pantoprazole (PROTONIX) 40 MG tablet, Take by mouth., Disp: , Rfl:  .  rosuvastatin (CRESTOR) 40 MG tablet, Take by mouth., Disp: , Rfl:  .  sotalol (BETAPACE) 80 MG tablet, TK 1 T PO BID, Disp: , Rfl: 2 .  spironolactone (ALDACTONE) 25 MG tablet, TK SS T PO ONCE D, Disp: , Rfl: 10  Past Medical History: No past medical history on file.  Tobacco Use: Social History   Tobacco Use  Smoking Status Never Smoker  Smokeless Tobacco Never Used    Labs: Recent Review Flowsheet Data    There is no flowsheet  data to display.       Exercise Target Goals: Exercise Program Goal: Individual exercise prescription set using results from initial 6 min walk test and THRR while considering  patient's activity barriers and safety.   Exercise Prescription Goal: Initial exercise prescription builds to 30-45 minutes a day of aerobic activity, 2-3 days per week.  Home exercise guidelines will be given to patient during program as part of exercise prescription that the participant will acknowledge.  Activity Barriers & Risk Stratification: Activity Barriers & Cardiac Risk Stratification - 03/10/18 1251      Activity Barriers & Cardiac Risk Stratification   Activity Barriers  Arthritis;Deconditioning;Muscular Weakness;Balance Concerns;Decreased Ventricular Function    Cardiac Risk Stratification  High       6 Minute Walk: 6 Minute Walk    Row Name 03/10/18 1250         6 Minute Walk   Phase  Initial     Distance  1468 feet     Walk Time  6 minutes     # of Rest Breaks  0     MPH  2.78     METS  2.64     RPE  12     VO2 Peak  9.26  Symptoms  No     Resting HR  58 bpm     Resting BP  126/64     Resting Oxygen Saturation   98 %     Exercise Oxygen Saturation  during 6 min walk  98 %     Max Ex. HR  81 bpm     Max Ex. BP  134/68     2 Minute Post BP  112/66        Oxygen Initial Assessment:   Oxygen Re-Evaluation:   Oxygen Discharge (Final Oxygen Re-Evaluation):   Initial Exercise Prescription: Initial Exercise Prescription - 03/10/18 1200      Date of Initial Exercise RX and Referring Provider   Date  03/10/18    Referring Provider  Chanetta Marshall MD      Treadmill   MPH  2.1    Grade  0.5    Minutes  15    METs  2.75      NuStep   Level  2    SPM  80    Minutes  15    METs  2.5      REL-XR   Level  1    Speed  50    Minutes  15    METs  2.5      Prescription Details   Frequency (times per week)  2    Duration  Progress to 30 minutes of continuous  aerobic without signs/symptoms of physical distress      Intensity   THRR 40-80% of Max Heartrate  92-125    Ratings of Perceived Exertion  11-13    Perceived Dyspnea  0-4      Progression   Progression  Continue to progress workloads to maintain intensity without signs/symptoms of physical distress.      Resistance Training   Training Prescription  Yes    Weight  3 lbs    Reps  10-15       Perform Capillary Blood Glucose checks as needed.  Exercise Prescription Changes: Exercise Prescription Changes    Row Name 03/10/18 1200 03/25/18 1100 03/27/18 1000 04/22/18 1600 05/05/18 1600     Response to Exercise   Blood Pressure (Admit)  126/64  118/64  -  144/76  108/66   Blood Pressure (Exercise)  134/68  138/62  -  140/62  142/66   Blood Pressure (Exit)  112/66  110/62  -  120/64  108/66   Heart Rate (Admit)  58 bpm  57 bpm  -  62 bpm  67 bpm   Heart Rate (Exercise)  81 bpm  118 bpm  -  76 bpm  97 bpm   Heart Rate (Exit)  50 bpm  67 bpm  -  64 bpm  66 bpm   Oxygen Saturation (Admit)  98 %  -  -  -  -   Oxygen Saturation (Exercise)  98 %  -  -  -  -   Rating of Perceived Exertion (Exercise)  12  13  -  12  12   Symptoms  none  none  -  none  none   Comments  walk test results  -  -  -  -   Duration  -  Continue with 30 min of aerobic exercise without signs/symptoms of physical distress.  -  Continue with 30 min of aerobic exercise without signs/symptoms of physical distress.  Continue with 30 min of aerobic exercise without signs/symptoms of physical distress.  Intensity  -  THRR unchanged  -  THRR unchanged  THRR unchanged     Progression   Progression  -  Continue to progress workloads to maintain intensity without signs/symptoms of physical distress.  -  Continue to progress workloads to maintain intensity without signs/symptoms of physical distress.  Continue to progress workloads to maintain intensity without signs/symptoms of physical distress.   Average METs  -  2.65  -   2.87  3.21     Resistance Training   Training Prescription  -  Yes  -  Yes  Yes   Weight  -  3 lbs  -  3 lbs  3 lbs   Reps  -  10-15  -  10-15  10-15     Interval Training   Interval Training  -  No  -  No  No     Treadmill   MPH  -  2.1  -  2.5  2.5   Grade  -  0.5  -  0  0   Minutes  -  15  -  15  15   METs  -  2.75  -  2.91  2.91     NuStep   Level  -  2  -  4  -   Minutes  -  15  -  15  -   METs  -  2.7  -  3  -     REL-XR   Level  -  1  -  5  5   Minutes  -  15  -  15  15   METs  -  2.5  -  2.7  3.5     Home Exercise Plan   Plans to continue exercise at  -  -  Home (comment) walking and treadmill  Home (comment) walking and treadmill  Home (comment) walking and treadmill   Frequency  -  -  Add 3 additional days to program exercise sessions.  Add 3 additional days to program exercise sessions.  Add 3 additional days to program exercise sessions.   Initial Home Exercises Provided  -  -  03/27/18  03/27/18  03/27/18   Row Name 05/21/18 1400 06/03/18 1000           Response to Exercise   Blood Pressure (Admit)  120/76  120/64      Blood Pressure (Exercise)  140/62  140/60      Blood Pressure (Exit)  96/60  128/70      Heart Rate (Admit)  60 bpm  60 bpm      Heart Rate (Exercise)  93 bpm  113 bpm      Heart Rate (Exit)  64 bpm  59 bpm      Rating of Perceived Exertion (Exercise)  13  12      Symptoms  none  none      Duration  Continue with 30 min of aerobic exercise without signs/symptoms of physical distress.  Continue with 30 min of aerobic exercise without signs/symptoms of physical distress.      Intensity  THRR unchanged  THRR unchanged        Progression   Progression  Continue to progress workloads to maintain intensity without signs/symptoms of physical distress.  Continue to progress workloads to maintain intensity without signs/symptoms of physical distress.      Average METs  3.3  3.03        Resistance  Training   Training Prescription  Yes  Yes       Weight  4 lbs  4 lbs      Reps  10-15  10-15        Interval Training   Interval Training  No  No        Treadmill   MPH  2.5  2.5      Grade  0  0      Minutes  15  15      METs  2.91  2.91        NuStep   Level  4  4      Minutes  15  15      METs  2.8  3.9        REL-XR   Level  5  5      Minutes  15  15      METs  4.2  2.3        Home Exercise Plan   Plans to continue exercise at  Home (comment) walking and treadmill  Home (comment) walking and treadmill      Frequency  Add 3 additional days to program exercise sessions.  Add 3 additional days to program exercise sessions.      Initial Home Exercises Provided  03/27/18  03/27/18         Exercise Comments: Exercise Comments    Row Name 03/18/18 0092           Exercise Comments  First full day of exercise!  Patient was oriented to gym and equipment including functions, settings, policies, and procedures.  Patient's individual exercise prescription and treatment plan were reviewed.  All starting workloads were established based on the results of the 6 minute walk test done at initial orientation visit.  The plan for exercise progression was also introduced and progression will be customized based on patient's performance and goals.          Exercise Goals and Review: Exercise Goals    Row Name 03/10/18 1301             Exercise Goals   Increase Physical Activity  Yes       Intervention  Provide advice, education, support and counseling about physical activity/exercise needs.;Develop an individualized exercise prescription for aerobic and resistive training based on initial evaluation findings, risk stratification, comorbidities and participant's personal goals.       Expected Outcomes  Short Term: Attend rehab on a regular basis to increase amount of physical activity.;Long Term: Exercising regularly at least 3-5 days a week.;Long Term: Add in home exercise to make exercise part of routine and to increase amount of  physical activity.       Increase Strength and Stamina  Yes       Intervention  Provide advice, education, support and counseling about physical activity/exercise needs.;Develop an individualized exercise prescription for aerobic and resistive training based on initial evaluation findings, risk stratification, comorbidities and participant's personal goals.       Expected Outcomes  Short Term: Increase workloads from initial exercise prescription for resistance, speed, and METs.;Short Term: Perform resistance training exercises routinely during rehab and add in resistance training at home;Long Term: Improve cardiorespiratory fitness, muscular endurance and strength as measured by increased METs and functional capacity (6MWT)       Able to understand and use rate of perceived exertion (RPE) scale  Yes       Intervention  Provide education and  explanation on how to use RPE scale       Expected Outcomes  Short Term: Able to use RPE daily in rehab to express subjective intensity level;Long Term:  Able to use RPE to guide intensity level when exercising independently       Able to understand and use Dyspnea scale  Yes       Intervention  Provide education and explanation on how to use Dyspnea scale       Expected Outcomes  Short Term: Able to use Dyspnea scale daily in rehab to express subjective sense of shortness of breath during exertion;Long Term: Able to use Dyspnea scale to guide intensity level when exercising independently       Knowledge and understanding of Target Heart Rate Range (THRR)  Yes       Intervention  Provide education and explanation of THRR including how the numbers were predicted and where they are located for reference       Expected Outcomes  Short Term: Able to state/look up THRR;Short Term: Able to use daily as guideline for intensity in rehab;Long Term: Able to use THRR to govern intensity when exercising independently       Able to check pulse independently  Yes        Intervention  Provide education and demonstration on how to check pulse in carotid and radial arteries.;Review the importance of being able to check your own pulse for safety during independent exercise       Expected Outcomes  Short Term: Able to explain why pulse checking is important during independent exercise;Long Term: Able to check pulse independently and accurately       Understanding of Exercise Prescription  Yes       Intervention  Provide education, explanation, and written materials on patient's individual exercise prescription       Expected Outcomes  Short Term: Able to explain program exercise prescription;Long Term: Able to explain home exercise prescription to exercise independently          Exercise Goals Re-Evaluation : Exercise Goals Re-Evaluation    Row Name 03/18/18 0929 03/25/18 1134 03/27/18 1003 04/08/18 1458 04/22/18 1624     Exercise Goal Re-Evaluation   Exercise Goals Review  Increase Physical Activity;Increase Strength and Stamina;Able to understand and use rate of perceived exertion (RPE) scale;Knowledge and understanding of Target Heart Rate Range (THRR);Understanding of Exercise Prescription  Increase Physical Activity;Increase Strength and Stamina;Understanding of Exercise Prescription  Increase Physical Activity;Increase Strength and Stamina;Understanding of Exercise Prescription;Able to understand and use rate of perceived exertion (RPE) scale;Knowledge and understanding of Target Heart Rate Range (THRR);Able to check pulse independently  Increase Physical Activity;Increase Strength and Stamina;Able to check pulse independently  Increase Physical Activity;Increase Strength and Stamina;Able to check pulse independently   Comments  Reviewed RPE scale, THR and program prescription with pt today.  Pt voiced understanding and was given a copy of goals to take home.   Carla is off to a good start in rehab again.  He has been playing golf some on his off days, but we will  need to talk about adding in home exercise.  He is up to 2.7 METs on the NuStep.  We will start to increase his workloads and monitor his progression.   Reviewed home exercise with pt today.  Pt plans to walk and use treadmill at home for exercise.  Reviewed THR, pulse, RPE, sign and symptoms, and when to call 911 or MD.  Also discussed weather considerations and  indoor options.  Pt voiced understanding.  Rilyn continues to do well in rehab.  He is now up to level 5 on the XR and level 4 on the NuStep.  We will continue to monitor his progres.   Thedore has been doing well in rehab.  He will need to add some incline to his treadmill.  We will continue to monitor his progression.   Expected Outcomes  Short: Use RPE daily to regulate intensity. Long: Follow program prescription in THR.  Short: Review home exercise guidelines.   Long: Continue to work on Printmaker and stamina.   Short: Start walking at least one extra day a week at home.  Long: Continue to exercise independently  Short: Continue to increase treadmill.  Long: Continue to increase strength and stamina.   Short: Add incline to treadmill.  Long: Continue to walk on off days.    [place] Name 05/05/18 1647 05/13/18 1016 05/20/18 1034 06/03/18 1000       Exercise Goal Re-Evaluation   Exercise Goals Review  Increase Physical Activity;Increase Strength and Stamina;Understanding of Exercise Prescription  Increase Physical Activity;Increase Strength and Stamina;Understanding of Exercise Prescription  Increase Physical Activity;Increase Strength and Stamina;Able to understand and use rate of perceived exertion (RPE) scale;Knowledge and understanding of Target Heart Rate Range (THRR);Able to check pulse independently;Understanding of Exercise Prescription  Increase Physical Activity;Increase Strength and Stamina;Understanding of Exercise Prescription    Comments  Jiraiya has been doing well in rehab.  He was out of town last week.  He still needs to  add incline to his treadmill as he has only attended once since last review.  We will continue to monitor his progress.   [facility] has been doing well in rehab.  He made up a few classes in the afternoon.  He has not started to add in exercise as he has been busy.  He does play golf on Wednesday so is staying active.  He does have a treadmill at home but it is in the attic. He is going to work on getting it down from there.Tera Mater does want to try the Elliptical for something new. He is going to join [employer]. He will ask someone to help him get his treadmill down.   Jama has been doing well in rehab.  He has not gotten on the elliptical yet, but still would like to try.  We will encourage him to switch it in to his rotation on his next visit.  We will continue to monitor his progression.     Expected Outcomes  Short: Add incline to treadmill.  Long: Continue to walk on off days.   Short: Get treadmill out of attic.  Long: Add in home exercise at least one extra day a week.   Short: get treadmill out of attic, join the [employer] [facility] [place]. Long: Commit to working out outside of Cardiac Rehab.  Short: Try the elliptical.  Long: Continue to exercise more on off days, besides just playing a round of golf.        Discharge Exercise Prescription (Final Exercise Prescription Changes): Exercise Prescription Changes - 06/03/18 1000      Response to Exercise   Blood Pressure (Admit)  120/64    Blood Pressure (Exercise)  140/60    Blood Pressure (Exit)  128/70    Heart Rate (Admit)  60 bpm    Heart Rate (Exercise)  113 bpm    Heart Rate (Exit)  59 bpm    Rating of Perceived Exertion (Exercise)  12    Symptoms  none    Duration  Continue with 30 min of aerobic exercise without signs/symptoms of physical distress.    Intensity  THRR unchanged      Progression   Progression  Continue to progress workloads to maintain intensity without signs/symptoms of physical distress.     Average METs  3.03      Resistance Training   Training Prescription  Yes    Weight  4 lbs    Reps  10-15      Interval Training   Interval Training  No      Treadmill   MPH  2.5    Grade  0    Minutes  15    METs  2.91      NuStep   Level  4    Minutes  15    METs  3.9      REL-XR   Level  5    Minutes  15    METs  2.3      Home Exercise Plan   Plans to continue exercise at  Home (comment)   walking and treadmill   Frequency  Add 3 additional days to program exercise sessions.    Initial Home Exercises Provided  03/27/18       Nutrition:  Target Goals: Understanding of nutrition guidelines, daily intake of sodium <1535m, cholesterol <2087m calories 30% from fat and 7% or less from saturated fats, daily to have 5 or more servings of fruits and vegetables.  Biometrics: Pre Biometrics - 03/10/18 1302      Pre Biometrics   Height  6' 3.5" (1.918 m)    Weight  230 lb 1.6 oz (104.4 kg)    Waist Circumference  40 inches    Hip Circumference  43 inches    Waist to Hip Ratio  0.93 %    BMI (Calculated)  28.37    Single Leg Stand  5.73 seconds        Nutrition Therapy Plan and Nutrition Goals: Nutrition Therapy & Goals - 03/18/18 1040      Nutrition Therapy   Diet  DM/ TLC   following the Mediterranean diet   Protein (specify units)  12oz    Fiber  35 grams    Whole Grain Foods  3 servings   chooses whole grains such as brown rice and whole grain bread   Saturated Fats  15 max. grams    Fruits and Vegetables  8 servings/day   has increased fruit and vegetable intake over the past month dramaticlly   Sodium  1500 grams      Personal Nutrition Goals   Nutrition Goal  Continue to work on making recent dietary change to the Mediterranean style of eating a lifestyle habit. Great job so far!    Personal Goal #2  Continue to minimize red / processed meat intake, reduce added sugar intake and drink sugar free beverages    Personal Goal #3  When eating out, look  for nutritious menu options that align with the Mediterrean style of eating most often    Comments  He and his wife have changed their eating patterns and style since his heart attack 4-5 weeks ago. They have been following a low sodium Mediterrean diet and have embraced this style of eating well. They have been eating out less, looking for desserts with less added sugar IE yogurt + Fruit &  Nuts, eaing less red meat, and eating more fruits/ vegetables/ whole grains. His wife has made the diet switch with him and his helping to prepare meals. He has been choosing eggs with wheat toast for breakfast most days. BG this week pre-prandial has been 100-120. His ultimate goal is to lower his A1C (previously 9.7).      Intervention Plan   Intervention  Prescribe, educate and counsel regarding individualized specific dietary modifications aiming towards targeted core components such as weight, hypertension, lipid management, diabetes, heart failure and other comorbidities.    Expected Outcomes  Short Term Goal: Understand basic principles of dietary content, such as calories, fat, sodium, cholesterol and nutrients.;Short Term Goal: A plan has been developed with personal nutrition goals set during dietitian appointment.;Long Term Goal: Adherence to prescribed nutrition plan.       Nutrition Assessments: Nutrition Assessments - 03/10/18 1100      MEDFICTS Scores   Pre Score  47       Nutrition Goals Re-Evaluation: Nutrition Goals Re-Evaluation    Row Name 03/18/18 1057 04/10/18 1018 05/13/18 1107         Goals   Nutrition Goal  Continue to work on making recent dietary change to the Mediterranean style of eating a lifestyle habit. Great job so far!  Continue to work on lifestyle change of eating a Mediterranean-style diet both at home and when eating out  Continue to work on the lifestyle-change of eating a Scientist, product/process development- style diet both at home and when eating out; Continue to minimize red meat / added  sugar intake and to reduce sugar sweetened beverages     Comment  Since his heart attack 4-5 weeks ago he has adopted this way of eating as a completely new eating style. So far he feels that he can stick to the Mediterranean diet long-term and is learning to enjoy the foods and cooking at home more often  He and his wife have been eating a Mediterranean-style diet since his heart attack and have been able to stick with it "easily." They are eating more fish and try to pick options when eating out that align with this style of eating as well. For example, his wife purchased a Mediterranean salad dressing that she brings to restaurants.  He reports ability to maintain style of eating, along with his wife. He "fell off" for Thanksgiving but other than that day has easily been able to stick with this lifestyle change     Expected Outcome  He will learn more about the [place] and experiment with recipes along with his wife to keep adherence high.  He will continue to be successful in implementing this change in eating style  Continue into the maintenance stage of the Mediteranean diet lifestyle change       Personal Goal #2 Re-Evaluation   Personal Goal #2  Continue to minimize red/ processed meatintake, reduce added sugar intake, and drink sugar free beverages  -  -       Personal Goal #3 Re-Evaluation   Personal Goal #3  When eating out, look for nutritious menu options that align with the Mediterrean style of eating most often  -  -        Nutrition Goals Discharge (Final Nutrition Goals Re-Evaluation): Nutrition Goals Re-Evaluation - 05/13/18 1107      Goals   Nutrition Goal  Continue to work on the lifestyle-change of eating a Scientist, product/process development- style diet both at home and when eating out; Continue to  minimize red meat / added sugar intake and to reduce sugar sweetened beverages    Comment  He reports ability to maintain style of eating, along with his wife. He "fell off" for Thanksgiving but  other than that day has easily been able to stick with this lifestyle change    Expected Outcome  Continue into the maintenance stage of the Mediteranean diet lifestyle change       Psychosocial: Target Goals: Acknowledge presence or absence of significant depression and/or stress, maximize coping skills, provide positive support system. Participant is able to verbalize types and ability to use techniques and skills needed for reducing stress and depression.   Initial Review & Psychosocial Screening: Initial Psych Review & Screening - 03/10/18 1255      Initial Review   Current issues with  Current Stress Concerns    Source of Stress Concerns  Unable to perform yard/household activities    Comments  Richardo wants to build back up his stamina so he can feel better daily. He states he sleeps well at night, but is still sleepy sometimes during the day. He is socially active, whether its eating breakfast with one of his sons or friends or playing golf weekly.       Family Dynamics   Good Support System?  Yes   spouse     Barriers   Psychosocial barriers to participate in program  There are no identifiable barriers or psychosocial needs.;The patient should benefit from training in stress management and relaxation.      Screening Interventions   Interventions  Encouraged to exercise;Program counselor consult    Expected Outcomes  Short Term goal: Utilizing psychosocial counselor, staff and physician to assist with identification of specific Stressors or current issues interfering with healing process. Setting desired goal for each stressor or current issue identified.;Long Term Goal: Stressors or current issues are controlled or eliminated.;Short Term goal: Identification and review with participant of any Quality of Life or Depression concerns found by scoring the questionnaire.;Long Term goal: The participant improves quality of Life and PHQ9 Scores as seen by post scores and/or verbalization of  changes       Quality of Life Scores:  Quality of Life - 03/10/18 1258      Quality of Life   Select  Quality of Life      Quality of Life Scores   Health/Function Pre  22.8 %    Socioeconomic Pre  26.14 %    Psych/Spiritual Pre  27.43 %    Family Pre  25.2 %    GLOBAL Pre  24.79 %      Scores of 19 and below usually indicate a poorer quality of life in these areas.  A difference of  2-3 points is a clinically meaningful difference.  A difference of 2-3 points in the total score of the Quality of Life Index has been associated with significant improvement in overall quality of life, self-image, physical symptoms, and general health in studies assessing change in quality of life.  PHQ-9: Recent Review Flowsheet Data    Depression screen [facility] 2/9 03/10/2018 03/02/2015 11/29/2014   Decreased Interest 0 0 0   Down, Depressed, Hopeless 0 0 0   PHQ - 2 Score 0 0 0   Altered sleeping 0 1 -   Tired, decreased energy 3 1 -   Change in appetite 0 0 -   Feeling bad or failure about yourself  0 0 -   Trouble concentrating 0 0 -  Moving slowly or fidgety/restless 0 0 -   Suicidal thoughts 0 0 -   PHQ-9 Score 3 2 -   Difficult doing work/chores Not difficult at all Not difficult at all -     Interpretation of Total Score  Total Score Depression Severity:  1-4 = Minimal depression, 5-9 = Mild depression, 10-14 = Moderate depression, 15-19 = Moderately severe depression, 20-27 = Severe depression   Psychosocial Evaluation and Intervention: Psychosocial Evaluation - 04/08/18 1006      Psychosocial Evaluation & Interventions   Interventions  Encouraged to exercise with the program and follow exercise prescription    Comments  Counselor met with Mr. Elvis Coil Tera Mater) today for initial psychosocial evaluation.  He is a 77 year old who has returned to this program after a subsequent heart attack on 9/4 with stents inserted.  He reports being in good health otherwise except for diabetes and sleep  apnea (which is helped with a CPAP).  Cainan has a strong support system with a spouse of 30 years; (2) adult children locally and active involvement in his local church.  He sleeps well and has a good appetite.  Geronimo denies a history of depression or anxiety or any current symptoms and his mood is typically positive.  Other than his health, he reports having minimal stress in his life at this time.  He has goals to get his strength back and lose some weight while in this program.  Staff will follow with Wong throughout the course of this program.      Expected Outcomes  Short:  Izaya will get in a routine of exercise to increase his strength and stamina.  He will meet with the dietician to address his weight loss goal. Long:  Raymar will develop a habit of positive self care with healthy eating and exercise being more consistent.     Continue Psychosocial Services   Follow up required by staff       Psychosocial Re-Evaluation: Psychosocial Re-Evaluation    [place] Name 05/13/18 1020 05/20/18 1040           Psychosocial Re-Evaluation   Current issues with  Current Stress Concerns  Current Stress Concerns      Comments  Annie is in a good place mentally. He is staying busy with the holidays but still finds time to get out to play golf.  He was able to enjoy Thanksgiving and plans to do the same for Christmas and then get back to his diet quickly.  He is doing well with his diet even when he goes out with friends during the week.  He is sleeping good.  He has enjoyed coming to class regularly and getting back into a routine twice a week.   Moo really doesn't have any stress concerns to report. He plays golf with his friends once a week. He doesn't like when this is interrupted by weather. Happy to see family for Christmas.       Expected Outcomes  Short: Continue to get in his round of golf for himself.  Long: Continue to stay postive.   Short: Continue to play golf. Long: Continue to  handle what comes his way with a good attitude and spend time with Senior friends.         Psychosocial Discharge (Final Psychosocial Re-Evaluation): Psychosocial Re-Evaluation - 05/20/18 1040      Psychosocial Re-Evaluation   Current issues with  Current Stress Concerns    Comments  Nelton really doesn't have any  stress concerns to report. He plays golf with his friends once a week. He doesn't like when this is interrupted by weather. Happy to see family for Christmas.     Expected Outcomes  Short: Continue to play golf. Long: Continue to handle what comes his way with a good attitude and spend time with Senior friends.       Vocational Rehabilitation: Provide vocational rehab assistance to qualifying candidates.   Vocational Rehab Evaluation & Intervention: Vocational Rehab - 03/10/18 1255      Initial Vocational Rehab Evaluation & Intervention   Assessment shows need for Vocational Rehabilitation  No       Education: Education Goals: Education classes will be provided on a variety of topics geared toward better understanding of heart health and risk factor modification. Participant will state understanding/return demonstration of topics presented as noted by education test scores.  Learning Barriers/Preferences: Learning Barriers/Preferences - 03/10/18 1255      Learning Barriers/Preferences   Learning Barriers  None    Learning Preferences  None       Education Topics:  AED/CPR: - Group verbal and written instruction with the use of models to demonstrate the basic use of the AED with the basic ABC's of resuscitation.   Cardiac Rehab from 05/29/2018 in [facility] Cardiac and Pulmonary Rehab  Date  04/10/18  Educator  SB  Instruction Review Code  1- Verbalizes Understanding      General Nutrition Guidelines/Fats and Fiber: -Group instruction provided by verbal, written material, models and posters to present the general guidelines for heart healthy nutrition. Gives an  explanation and review of dietary fats and fiber.   Cardiac Rehab from 05/29/2018 in [facility] Cardiac and Pulmonary Rehab  Date  04/15/18  Educator  LB  Instruction Review Code  1- Verbalizes Understanding      Controlling Sodium/Reading Food Labels: -Group verbal and written material supporting the discussion of sodium use in heart healthy nutrition. Review and explanation with models, verbal and written materials for utilization of the food label.   Cardiac Rehab from 05/29/2018 in [facility] Cardiac and Pulmonary Rehab  Date  04/17/18  Educator  LB  Instruction Review Code  1- Verbalizes Understanding      Exercise Physiology & General Exercise Guidelines: - Group verbal and written instruction with models to review the exercise physiology of the cardiovascular system and associated critical values. Provides general exercise guidelines with specific guidelines to those with heart or lung disease.    Aerobic Exercise & Resistance Training: - Gives group verbal and written instruction on the various components of exercise. Focuses on aerobic and resistive training programs and the benefits of this training and how to safely progress through these programs..   Flexibility, Balance, Mind/Body Relaxation: Provides group verbal/written instruction on the benefits of flexibility and balance training, including mind/body exercise modes such as yoga, pilates and tai chi.  Demonstration and skill practice provided.   Cardiac Rehab from 05/29/2018 in [facility] Cardiac and Pulmonary Rehab  Date  05/05/18  Educator  AS  Instruction Review Code  5- Refused Teaching      Stress and Anxiety: - Provides group verbal and written instruction about the health risks of elevated stress and causes of high stress.  Discuss the correlation between heart/lung disease and anxiety and treatment options. Review healthy ways to manage with stress and anxiety.   Depression: - Provides group verbal and written  instruction on the correlation between heart/lung disease and depressed mood, treatment options, and the stigmas  associated with seeking treatment.   Cardiac Rehab from 05/29/2018 in [facility] Cardiac and Pulmonary Rehab  Date  05/15/18  Educator  [facility]  Instruction Review Code  5- Refused Teaching      Anatomy & Physiology of the Heart: - Group verbal and written instruction and models provide basic cardiac anatomy and physiology, with the coronary electrical and arterial systems. Review of Valvular disease and Heart Failure   Cardiac Procedures: - Group verbal and written instruction to review commonly prescribed medications for heart disease. Reviews the medication, class of the drug, and side effects. Includes the steps to properly store meds and maintain the prescription regimen. (beta blockers and nitrates)   Cardiac Rehab from 05/29/2018 in [facility] Cardiac and Pulmonary Rehab  Date  05/20/18  Educator  SB  Instruction Review Code  5- Refused Teaching      Cardiac Medications I: - Group verbal and written instruction to review commonly prescribed medications for heart disease. Reviews the medication, class of the drug, and side effects. Includes the steps to properly store meds and maintain the prescription regimen.   Cardiac Rehab from 05/29/2018 in [facility] Cardiac and Pulmonary Rehab  Date  04/01/18  Educator  SB  Instruction Review Code  1- Verbalizes Understanding      Cardiac Medications II: -Group verbal and written instruction to review commonly prescribed medications for heart disease. Reviews the medication, class of the drug, and side effects. (all other drug classes)   Cardiac Rehab from 05/29/2018 in [facility] Cardiac and Pulmonary Rehab  Date  05/13/18  Educator  SB  Instruction Review Code  5- Refused Teaching       Go Sex-Intimacy & Heart Disease, Get SMART - Goal Setting: - Group verbal and written instruction through game format to discuss heart disease and the return to  sexual intimacy. Provides group verbal and written material to discuss and apply goal setting through the application of the S.M.A.R.T. Method.   Cardiac Rehab from 05/29/2018 in [facility] Cardiac and Pulmonary Rehab  Date  05/20/18  Educator  SB  Instruction Review Code  5- Refused Teaching      Other Matters of the Heart: - Provides group verbal, written materials and models to describe Stable Angina and Peripheral Artery. Includes description of the disease process and treatment options available to the cardiac patient.   Exercise & Equipment Safety: - Individual verbal instruction and demonstration of equipment use and safety with use of the equipment.   Cardiac Rehab from 05/29/2018 in [facility] Cardiac and Pulmonary Rehab  Date  03/10/18  Educator  [facility]  Instruction Review Code  1- Verbalizes Understanding      Infection Prevention: - Provides verbal and written material to individual with discussion of infection control including proper hand washing and proper equipment cleaning during exercise session.   Falls Prevention: - Provides verbal and written material to individual with discussion of falls prevention and safety.   Cardiac Rehab from 05/29/2018 in [facility] Cardiac and Pulmonary Rehab  Date  03/10/18  Educator  [facility]  Instruction Review Code  1- Verbalizes Understanding      Diabetes: - Individual verbal and written instruction to review signs/symptoms of diabetes, desired ranges of glucose level fasting, after meals and with exercise. Acknowledge that pre and post exercise glucose checks will be done for 3 sessions at entry of program.   Cardiac Rehab from 05/29/2018 in [facility] Cardiac and Pulmonary Rehab  Date  03/10/18  Educator  [facility]  Instruction Review Code  1-  Verbalizes Understanding      Know Your Numbers and Risk Factors: -Group verbal and written instruction about important numbers in your health.  Discussion of what are risk factors and how they play a role in the  disease process.  Review of Cholesterol, Blood Pressure, Diabetes, and BMI and the role they play in your overall health.   Cardiac Rehab from 05/29/2018 in [facility] Cardiac and Pulmonary Rehab  Date  05/13/18  Educator  SB  Instruction Review Code  5- Refused Teaching      Sleep Hygiene: -Provides group verbal and written instruction about how sleep can affect your health.  Define sleep hygiene, discuss sleep cycles and impact of sleep habits. Review good sleep hygiene tips.    Other: -Provides group and verbal instruction on various topics (see comments)   Knowledge Questionnaire Score: Knowledge Questionnaire Score - 03/10/18 1100      Knowledge Questionnaire Score   Pre Score  23/26   test reviewed with pt today      Core Components/Risk Factors/Patient Goals at Admission: Personal Goals and Risk Factors at Admission - 03/10/18 1253      Core Components/Risk Factors/Patient Goals on Admission    Weight Management  Yes;Weight Loss;Obesity    Intervention  Weight Management: Develop a combined nutrition and exercise program designed to reach desired caloric intake, while maintaining appropriate intake of nutrient and fiber, sodium and fats, and appropriate energy expenditure required for the weight goal.;Weight Management: Provide education and appropriate resources to help participant work on and attain dietary goals.;Obesity: Provide education and appropriate resources to help participant work on and attain dietary goals.;Weight Management/Obesity: Establish reasonable short term and long term weight goals.    Admit Weight  230 lb (104.3 kg)    Goal Weight: Short Term  225 lb (102.1 kg)    Goal Weight: Long Term  210 lb (95.3 kg)    Expected Outcomes  Short Term: Continue to assess and modify interventions until short term weight is achieved;Long Term: Adherence to nutrition and physical activity/exercise program aimed toward attainment of established weight goal;Understanding  recommendations for meals to include 15-35% energy as protein, 25-35% energy from fat, 35-60% energy from carbohydrates, less than 218m of dietary cholesterol, 20-35 gm of total fiber daily;Understanding of distribution of calorie intake throughout the day with the consumption of 4-5 meals/snacks;Weight Loss: Understanding of general recommendations for a balanced deficit meal plan, which promotes 1-2 lb weight loss per week and includes a negative energy balance of [phone] kcal/d    Diabetes  Yes    Intervention  Provide education about signs/symptoms and action to take for hypo/hyperglycemia.;Provide education about proper nutrition, including hydration, and aerobic/resistive exercise prescription along with prescribed medications to achieve blood glucose in normal ranges: Fasting glucose 65-99 mg/dL    Expected Outcomes  Short Term: Participant verbalizes understanding of the signs/symptoms and immediate care of hyper/hypoglycemia, proper foot care and importance of medication, aerobic/resistive exercise and nutrition plan for blood glucose control.;Long Term: Attainment of HbA1C < 7%.    Hypertension  Yes    Intervention  Provide education on lifestyle modifcations including regular physical activity/exercise, weight management, moderate sodium restriction and increased consumption of fresh fruit, vegetables, and low fat dairy, alcohol moderation, and smoking cessation.;Monitor prescription use compliance.    Expected Outcomes  Short Term: Continued assessment and intervention until BP is < 140/929mHG in hypertensive participants. < 130/8067mG in hypertensive participants with diabetes, heart failure or chronic kidney disease.;Long Term: Maintenance of  blood pressure at goal levels.    Lipids  Yes    Intervention  Provide education and support for participant on nutrition & aerobic/resistive exercise along with prescribed medications to achieve LDL <15m, HDL >484m    Expected Outcomes  Short Term:  Participant states understanding of desired cholesterol values and is compliant with medications prescribed. Participant is following exercise prescription and nutrition guidelines.;Long Term: Cholesterol controlled with medications as prescribed, with individualized exercise RX and with personalized nutrition plan. Value goals: LDL < 7086mHDL > 40 mg.       Core Components/Risk Factors/Patient Goals Review:  Goals and Risk Factor Review    Row Name 03/25/18 1037 05/13/18 1023 05/20/18 1042         Core Components/Risk Factors/Patient Goals Review   Personal Goals Review  Weight Management/Obesity;Hypertension;Diabetes;Lipids  Weight Management/Obesity;Hypertension;Diabetes;Lipids  Diabetes;Hypertension;Lipids;Weight Management/Obesity     Review  HarDalante off to a good start in rehab.  He is working to get his weight down to 210lbs at home.  Currently he is averaging around 215lbs.  He is doing well with his blood sugars at usually around 100-120s in the morning.  He checks them every morning and usualy once in the afternoon or evening as well.   His has does not check his blood pressures at home as he does not have a cuff, but we talked about getting a new one to check at home.  He is doing well with his medications and has no problems.   HarBrycetons been maintaining his weight for the most part. He is up a little today after Thanksgiving.  He is around 225 lbs at home.  He is sure that it will come back down.  He continues to check his sugars regularly at home.  His A1c is down to 7.1 so he has better control.   He is still not checking his pressures at home and has not gotten a new cuff.  He feels comfortable with where his pressures are here and in the office.  He is still doing well with his medicaitons.   HarOlegario doing a Mediterranean nutrition plan with his wife. He checks blood sugar every morning. He does not have a BP machine. I recommended that he buy one to have at home to check his BP  and also his wife's bp if ever needed. I     Expected Outcomes  Short: Get new blood pressure cuff to check at home.  Long: Continue to work on weight loss.   Short: Continue to check sugars regularly and get weight back down.  Long: Continue to work on weight loss.   Short: Continue to check sugars, eat healthy, and buy a bp machine which we will check for accuracy for him. Long: Continue to work on weight loss and take medications as prescribed.        Core Components/Risk Factors/Patient Goals at Discharge (Final Review):  Goals and Risk Factor Review - 05/20/18 1042      Core Components/Risk Factors/Patient Goals Review   Personal Goals Review  Diabetes;Hypertension;Lipids;Weight Management/Obesity    Review  HarKyrel doing a Mediterranean nutrition plan with his wife. He checks blood sugar every morning. He does not have a BP machine. I recommended that he buy one to have at home to check his BP and also his wife's bp if ever needed. I    Expected Outcomes  Short: Continue to check sugars, eat healthy, and buy a bp machine which we  will check for accuracy for him. Long: Continue to work on weight loss and take medications as prescribed.       ITP Comments: ITP Comments    Row Name 03/10/18 1249 03/19/18 0636 03/25/18 1036 04/16/18 0602 05/14/18 0609   ITP Comments  Med Review completed. Initial ITP created. Diagnosis can be found in Care Everywhere 9/3  30 day review.  Continue with ITP unless directed changes per Medical Director review.  New to program  Amir has been to most of the education classes before, but we talked about making sure he comes in for some of them that are newer.   30 day review. Continue with ITP unless direccted changes per Medical Director Chart Review.  30 day review. Continue with ITP unless direccted changes per Medical Director Chart Review.   [place] Name 06/10/18 0636           ITP Comments  30 Day Review. Continue with ITP unless directed changes per  Medical Director review          Comments:

## 2018-06-12 ENCOUNTER — Encounter: Payer: Medicare Other | Attending: Cardiovascular Disease

## 2018-06-12 DIAGNOSIS — Z79899 Other long term (current) drug therapy: Secondary | ICD-10-CM | POA: Insufficient documentation

## 2018-06-12 DIAGNOSIS — I213 ST elevation (STEMI) myocardial infarction of unspecified site: Secondary | ICD-10-CM | POA: Insufficient documentation

## 2018-06-12 NOTE — Progress Notes (Signed)
Daily Session Note  Patient Details  Name: RAMZY CAPPELLETTI MRN: [id number] Date of Birth: 08/09/1940 Referring Provider:     Cardiac Rehab from 03/10/2018 in [facility] Cardiac and Pulmonary Rehab  Referring Provider  Chanetta Marshall MD      Encounter Date: 06/12/2018  Check In: Session Check In - 06/12/18 0927      Check-In   Supervising physician immediately available to respond to emergencies  See telemetry face sheet for immediately available ER MD    Location  [facility]-Cardiac & Pulmonary Rehab    Staff Present  Jasper Loser BS, Exercise Physiologist;Carroll Enterkin, RN, BSN;Rasheeda Mulvehill RCP,RRT,BSRT    Medication changes reported      No    Fall or balance concerns reported     No    Warm-up and Cool-down  Performed as group-led instruction    Resistance Training Performed  Yes    VAD Patient?  No    PAD/SET Patient?  No      Pain Assessment   Currently in Pain?  No/denies          Social History   Tobacco Use  Smoking Status Never Smoker  Smokeless Tobacco Never Used    Goals Met:  Independence with exercise equipment Exercise tolerated well No report of cardiac concerns or symptoms Strength training completed today  Goals Unmet:  Not Applicable  Comments: Pt able to follow exercise prescription today without complaint.  Will continue to monitor for progression.    Dr. Emily Filbert is Medical Director for [place] [facility] and [name].

## 2018-06-17 ENCOUNTER — Encounter: Payer: Medicare Other | Admitting: *Deleted

## 2018-06-17 DIAGNOSIS — Z9861 Coronary angioplasty status: Secondary | ICD-10-CM

## 2018-06-17 DIAGNOSIS — I213 ST elevation (STEMI) myocardial infarction of unspecified site: Secondary | ICD-10-CM | POA: Diagnosis not present

## 2018-06-17 NOTE — Progress Notes (Signed)
Daily Session Note  Patient Details  Name: Brandon Wall MRN: [id number] Date of Birth: 1940-10-01 Referring Provider:     Cardiac Rehab from 03/10/2018 in [facility] Cardiac and Pulmonary Rehab  Referring Provider  Chanetta Marshall MD      Encounter Date: 06/17/2018  Check In: Session Check In - 06/17/18 4431      Check-In   Supervising physician immediately available to respond to emergencies  See telemetry face sheet for immediately available ER MD    Location  [facility]-Cardiac & Pulmonary Rehab    Staff Present  Heath Lark, RN, BSN, CCRP;Jeanna Durrell BS, Exercise Physiologist;Elza [place], MA, RCEP, CCRP, Exercise Physiologist;Amanda Oletta Darter, BA, ACSM CEP, Exercise Physiologist    Medication changes reported      No    Fall or balance concerns reported     No    Warm-up and Cool-down  Performed as group-led instruction    Resistance Training Performed  Yes    VAD Patient?  No    PAD/SET Patient?  No      Pain Assessment   Currently in Pain?  No/denies          Social History   Tobacco Use  Smoking Status Never Smoker  Smokeless Tobacco Never Used    Goals Met:  Independence with exercise equipment Exercise tolerated well No report of cardiac concerns or symptoms Strength training completed today  Goals Unmet:  Not Applicable  Comments: Pt able to follow exercise prescription today without complaint.  Will continue to monitor for progression.    Dr. Emily Filbert is Medical Director for [place] and [name].

## 2018-06-19 DIAGNOSIS — Z9861 Coronary angioplasty status: Secondary | ICD-10-CM

## 2018-06-19 DIAGNOSIS — I213 ST elevation (STEMI) myocardial infarction of unspecified site: Secondary | ICD-10-CM | POA: Diagnosis not present

## 2018-06-19 NOTE — Progress Notes (Signed)
Daily Session Note  Patient Details  Name: Brandon Wall MRN: [id number] Date of Birth: 29-Oct-1940 Referring Provider:     Cardiac Rehab from 03/10/2018 in [facility] Cardiac and Pulmonary Rehab  Referring Provider  Chanetta Marshall MD      Encounter Date: 06/19/2018  Check In:      Social History   Tobacco Use  Smoking Status Never Smoker  Smokeless Tobacco Never Used    Goals Met:  Independence with exercise equipment Exercise tolerated well No report of cardiac concerns or symptoms Strength training completed today  Goals Unmet:  Not Applicable  Comments: Pt able to follow exercise prescription today without complaint.  Will continue to monitor for progression.    Dr. Emily Filbert is Medical Director for [name].

## 2018-06-24 ENCOUNTER — Encounter: Payer: Medicare Other | Admitting: *Deleted

## 2018-06-24 DIAGNOSIS — I213 ST elevation (STEMI) myocardial infarction of unspecified site: Secondary | ICD-10-CM

## 2018-06-24 NOTE — Progress Notes (Signed)
Daily Session Note  Patient Details  Name: Brandon Wall MRN: [id number] Date of Birth: September 22, 1940 Referring Provider:     Cardiac Rehab from 03/10/2018 in [facility] Cardiac and Pulmonary Rehab  Referring Provider  Chanetta Marshall MD      Encounter Date: 06/24/2018  Check In: Session Check In - 06/24/18 0950      Check-In   Supervising physician immediately available to respond to emergencies  See telemetry face sheet for immediately available ER MD    Location  [facility]-Cardiac & Pulmonary Rehab    Staff Present  Alberteen Sam, MA, RCEP, CCRP, Exercise Physiologist;Maximino Cozzolino Foy [place], BA, ACSM CEP, Exercise Physiologist;Susanne Bice, RN, BSN, CCRP    Medication changes reported      No    Fall or balance concerns reported     No    Warm-up and Cool-down  Performed as group-led instruction    Resistance Training Performed  Yes    VAD Patient?  No    PAD/SET Patient?  No      Pain Assessment   Currently in Pain?  No/denies          Social History   Tobacco Use  Smoking Status Never Smoker  Smokeless Tobacco Never Used    Goals Met:  Independence with exercise equipment Exercise tolerated well No report of cardiac concerns or symptoms Strength training completed today  Goals Unmet:  Not Applicable  Comments: Pt able to follow exercise prescription today without complaint.  Will continue to monitor for progression.    Dr. Emily Filbert is Medical Director for [place] [name].

## 2018-06-26 ENCOUNTER — Encounter: Payer: Medicare Other | Admitting: *Deleted

## 2018-06-26 DIAGNOSIS — I213 ST elevation (STEMI) myocardial infarction of unspecified site: Secondary | ICD-10-CM | POA: Diagnosis not present

## 2018-06-26 DIAGNOSIS — Z9861 Coronary angioplasty status: Secondary | ICD-10-CM

## 2018-06-26 NOTE — Progress Notes (Signed)
Daily Session Note  Patient Details  Name: Brandon Wall MRN: [id number] Date of Birth: June 21, 1940 Referring Provider:     Cardiac Rehab from 03/10/2018 in [facility] Cardiac and Pulmonary Rehab  Referring Provider  Chanetta Marshall MD      Encounter Date: 06/26/2018  Check In: Session Check In - 06/26/18 1413      Check-In   Supervising physician immediately available to respond to emergencies  See telemetry face sheet for immediately available ER MD    Location  [facility]-Cardiac & Pulmonary Rehab    Staff Present  Vida Rigger RN, BSN;Iveth Heidemann Luan Pulling, MA, RCEP, CCRP, Exercise Physiologist;Jeanna Durrell BS, Exercise Physiologist;Joseph Tessie Fass RCP,RRT,BSRT    Medication changes reported      No    Fall or balance concerns reported     No    Warm-up and Cool-down  Performed as group-led instruction    Resistance Training Performed  Yes    VAD Patient?  No    PAD/SET Patient?  No      Pain Assessment   Currently in Pain?  No/denies          Social History   Tobacco Use  Smoking Status Never Smoker  Smokeless Tobacco Never Used    Goals Met:  Independence with exercise equipment Exercise tolerated well No report of cardiac concerns or symptoms Strength training completed today  Goals Unmet:  Not Applicable  Comments: Pt able to follow exercise prescription today without complaint.  Will continue to monitor for progression.    Dr. Emily Filbert is Medical Director for [name].

## 2018-07-08 VITALS — Ht 75.5 in | Wt 241.0 lb

## 2018-07-08 DIAGNOSIS — I213 ST elevation (STEMI) myocardial infarction of unspecified site: Secondary | ICD-10-CM

## 2018-07-08 DIAGNOSIS — Z9861 Coronary angioplasty status: Secondary | ICD-10-CM

## 2018-07-08 NOTE — Progress Notes (Signed)
Daily Session Note  Patient Details  Name: Brandon Wall MRN: [id number] Date of Birth: 1940-06-27 Referring Provider:     Cardiac Rehab from 03/10/2018 in [facility] Cardiac and Pulmonary Rehab  Referring Provider  Chanetta Marshall MD      Encounter Date: 07/08/2018  Check In: Session Check In - 07/08/18 0957      Check-In   Supervising physician immediately available to respond to emergencies  See telemetry face sheet for immediately available ER MD    Location  [facility]-Cardiac & Pulmonary Rehab    Staff Present  Darel Hong, RN BSN;Adonai Selsor BS, Exercise Physiologist;Jessica Luan Pulling, MA, RCEP, CCRP, Exercise Physiologist;Amanda Oletta Darter, BA, ACSM CEP, Exercise Physiologist    Medication changes reported      No    Fall or balance concerns reported     No    Tobacco Cessation  No Change    Warm-up and Cool-down  Performed as group-led instruction    Resistance Training Performed  Yes    VAD Patient?  No    PAD/SET Patient?  No      Pain Assessment   Currently in Pain?  No/denies    Multiple Pain Sites  No          Social History   Tobacco Use  Smoking Status Never Smoker  Smokeless Tobacco Never Used    Goals Met:  Independence with exercise equipment Exercise tolerated well Personal goals reviewed No report of cardiac concerns or symptoms Strength training completed today  Goals Unmet:  Not Applicable  Comments: Pt able to follow exercise prescription today without complaint.  Will continue to monitor for progression.  Macy Name 03/10/18 1250 07/08/18 1204       6 Minute Walk   Phase  Initial  Discharge    Distance  1468 feet  1546 feet    Distance % Change  -  5.3 %    Distance Feet Change  -  78 ft    Walk Time  6 minutes  6 minutes    # of Rest Breaks  0  0    MPH  2.78  2.92    METS  2.64  2.62    RPE  12  13    VO2 Peak  9.26  9.19    Symptoms  No  No    Resting HR  58 bpm  84 bpm    Resting BP  126/64  120/78    Resting  Oxygen Saturation   98 %  97 %    Exercise Oxygen Saturation  during 6 min walk  98 %  97 %    Max Ex. HR  81 bpm  100 bpm    Max Ex. BP  134/68  140/68    2 Minute Post BP  112/66  -        Dr. Emily Filbert is Medical Director for [place] [name].

## 2018-07-09 ENCOUNTER — Encounter: Payer: Self-pay | Admitting: *Deleted

## 2018-07-09 DIAGNOSIS — I213 ST elevation (STEMI) myocardial infarction of unspecified site: Secondary | ICD-10-CM

## 2018-07-09 DIAGNOSIS — Z9861 Coronary angioplasty status: Secondary | ICD-10-CM

## 2018-07-09 NOTE — Progress Notes (Signed)
Cardiac Individual Treatment Plan  Patient Details  Name: Brandon Wall MRN: [id number] Date of Birth: December 03, 1940 Referring Provider:     Cardiac Rehab from 03/10/2018 in [facility]  Referring Provider  Chanetta Marshall MD      Initial Encounter Date:    Cardiac Rehab from 03/10/2018 in [facility] Cardiac and Pul[facility] Re[facility]  Date  03/10/18      Visit Diagnosis: ST elevation myocardial infarction (STEMI), unspecified artery ([place])  S/P PTCA (percutaneous transluminal coronary angioplasty)  Patient's Home Medications on Admission:  Current Outpatient Medications:  .  apixaban (ELIQUIS) 5 MG TABS tablet, TAKE 1 TABLET BY MOUTH TWICE DAILY, Disp: , Rfl:  .  aspirin 81 MG chewable tablet, Chew by mouth., Disp: , Rfl:  .  clopidogrel (PLAVIX) 75 MG tablet, , Disp: , Rfl: 0 .  glipiZIDE (GLUCOTROL) 5 MG tablet, Take by mouth., Disp: , Rfl:  .  glucose blood test strip, , Disp: , Rfl:  .  isosorbide mononitrate (IMDUR) 30 MG 24 hr tablet, Take by mouth., Disp: , Rfl:  .  JARDIANCE 25 MG TABS tablet, , Disp: , Rfl:  .  ketoconazole (NIZORAL) 2 % cream, , Disp: , Rfl:  .  losartan (COZAAR) 50 MG tablet, 50 mg daily. , Disp: , Rfl:  .  metFORMIN (GLUCOPHAGE) 1000 MG tablet, Take 2,000 mg by mouth 2 (two) times daily with a meal. , Disp: , Rfl:  .  metoprolol succinate (TOPROL-XL) 100 MG 24 hr tablet, Take 50 mg by mouth. , Disp: , Rfl:  .  nitroGLYCERIN (NITROSTAT) 0.4 MG SL tablet, Place under the tongue., Disp: , Rfl:  .  pantoprazole (PROTONIX) 40 MG tablet, Take by mouth., Disp: , Rfl:  .  rosuvastatin (CRESTOR) 40 MG tablet, Take by mouth., Disp: , Rfl:  .  sotalol (BETAPACE) 80 MG tablet, TK 1 T PO BID, Disp: , Rfl: 2 .  spironolactone (ALDACTONE) 25 MG tablet, TK SS T PO ONCE D, Disp: , Rfl: 10  Past Medical History: No past medical history on file.  Tobacco Use: Social History   Tobacco Use  Smoking Status Never Smoker  Smokeless Tobacco Never Used     Labs: Recent Review Flowsheet Data    There is no flowsheet data to display.       Exercise Target Goals: Exercise Program Goal: Individual exercise prescription set using results from initial 6 min walk test and THRR while considering  patient's activity barriers and safety.   Exercise Prescription Goal: Initial exercise prescription builds to 30-45 minutes a day of aerobic activity, 2-3 days per week.  Home exercise guidelines will be given to patient during program as part of exercise prescription that the participant will acknowledge.  Activity Barriers & Risk Stratification: Activity Barriers & Cardiac Risk Stratification - 03/10/18 1251      Activity Barriers & Cardiac Risk Stratification   Activity Barriers  Arthritis;Deconditioning;Muscular Weakness;Balance Concerns;Decreased Ventricular Function    Cardiac Risk Stratification  High       6 Minute Walk: 6 Minute Walk    Row Name 03/10/18 1250 07/08/18 1204       6 Minute Walk   Phase  Initial  Discharge    Distance  1468 feet  1546 feet    Distance % Change  -  5.3 %    Distance Feet Change  -  78 ft    Walk Time  6 minutes  6 minutes    # of Rest  Breaks  0  0    MPH  2.78  2.92    METS  2.64  2.62    RPE  12  13    VO2 Peak  9.26  9.19    Symptoms  No  No    Resting HR  58 bpm  84 bpm    Resting BP  126/64  120/78    Resting Oxygen Saturation   98 %  97 %    Exercise Oxygen Saturation  during 6 min walk  98 %  97 %    Max Ex. HR  81 bpm  100 bpm    Max Ex. BP  134/68  140/68    2 Minute Post BP  112/66  -       Oxygen Initial Assessment:   Oxygen Re-Evaluation:   Oxygen Discharge (Final Oxygen Re-Evaluation):   Initial Exercise Prescription: Initial Exercise Prescription - 03/10/18 1200      Date of Initial Exercise RX and Referring Provider   Date  03/10/18    Referring Provider  Chanetta Marshall MD      Treadmill   MPH  2.1    Grade  0.5    Minutes  15    METs  2.75      NuStep    Level  2    SPM  80    Minutes  15    METs  2.5      REL-XR   Level  1    Speed  50    Minutes  15    METs  2.5      Prescription Details   Frequency (times per week)  2    Duration  Progress to 30 minutes of continuous aerobic without signs/symptoms of physical distress      Intensity   THRR 40-80% of Max Heartrate  92-125    Ratings of Perceived Exertion  11-13    Perceived Dyspnea  0-4      Progression   Progression  Continue to progress workloads to maintain intensity without signs/symptoms of physical distress.      Resistance Training   Training Prescription  Yes    Weight  3 lbs    Reps  10-15       Perform Capillary Blood Glucose checks as needed.  Exercise Prescription Changes: Exercise Prescription Changes    Row Name 03/10/18 1200 03/25/18 1100 03/27/18 1000 04/22/18 1600 05/05/18 1600     Response to Exercise   Blood Pressure (Admit)  126/64  118/64  -  144/76  108/66   Blood Pressure (Exercise)  134/68  138/62  -  140/62  142/66   Blood Pressure (Exit)  112/66  110/62  -  120/64  108/66   Heart Rate (Admit)  58 bpm  57 bpm  -  62 bpm  67 bpm   Heart Rate (Exercise)  81 bpm  118 bpm  -  76 bpm  97 bpm   Heart Rate (Exit)  50 bpm  67 bpm  -  64 bpm  66 bpm   Oxygen Saturation (Admit)  98 %  -  -  -  -   Oxygen Saturation (Exercise)  98 %  -  -  -  -   Rating of Perceived Exertion (Exercise)  12  13  -  12  12   Symptoms  none  none  -  none  none   Comments  walk test results  -  -  -  -  Duration  -  Continue with 30 min of aerobic exercise without signs/symptoms of physical distress.  -  Continue with 30 min of aerobic exercise without signs/symptoms of physical distress.  Continue with 30 min of aerobic exercise without signs/symptoms of physical distress.   Intensity  -  THRR unchanged  -  THRR unchanged  THRR unchanged     Progression   Progression  -  Continue to progress workloads to maintain intensity without signs/symptoms of physical  distress.  -  Continue to progress workloads to maintain intensity without signs/symptoms of physical distress.  Continue to progress workloads to maintain intensity without signs/symptoms of physical distress.   Average METs  -  2.65  -  2.87  3.21     Resistance Training   Training Prescription  -  Yes  -  Yes  Yes   Weight  -  3 lbs  -  3 lbs  3 lbs   Reps  -  10-15  -  10-15  10-15     Interval Training   Interval Training  -  No  -  No  No     Treadmill   MPH  -  2.1  -  2.5  2.5   Grade  -  0.5  -  0  0   Minutes  -  15  -  15  15   METs  -  2.75  -  2.91  2.91     NuStep   Level  -  2  -  4  -   Minutes  -  15  -  15  -   METs  -  2.7  -  3  -     REL-XR   Level  -  1  -  5  5   Minutes  -  15  -  15  15   METs  -  2.5  -  2.7  3.5     Home Exercise Plan   Plans to continue exercise at  -  -  Home (comment) walking and treadmill  Home (comment) walking and treadmill  Home (comment) walking and treadmill   Frequency  -  -  Add 3 additional days to program exercise sessions.  Add 3 additional days to program exercise sessions.  Add 3 additional days to program exercise sessions.   Initial Home Exercises Provided  -  -  03/27/18  03/27/18  03/27/18   Row Name 05/21/18 1400 06/03/18 1000 06/17/18 1600 07/02/18 1300       Response to Exercise   Blood Pressure (Admit)  120/76  120/64  112/68  126/70    Blood Pressure (Exercise)  140/62  140/60  118/60  132/74    Blood Pressure (Exit)  96/60  128/70  134/64  98/58    Heart Rate (Admit)  60 bpm  60 bpm  62 bpm  58 bpm    Heart Rate (Exercise)  93 bpm  113 bpm  116 bpm  79 bpm    Heart Rate (Exit)  64 bpm  59 bpm  58 bpm  60 bpm    Rating of Perceived Exertion (Exercise)  '13  12  12  12    ' Symptoms  none  none  none  none    Duration  Continue with 30 min of aerobic exercise without signs/symptoms of physical distress.  Continue with 30 min of aerobic exercise without signs/symptoms of physical distress.  Continue with 30 min  of aerobic exercise without signs/symptoms of physical distress.  Continue with 30 min of aerobic exercise without signs/symptoms of physical distress.    Intensity  THRR unchanged  THRR unchanged  THRR unchanged  THRR unchanged      Progression   Progression  Continue to progress workloads to maintain intensity without signs/symptoms of physical distress.  Continue to progress workloads to maintain intensity without signs/symptoms of physical distress.  Continue to progress workloads to maintain intensity without signs/symptoms of physical distress.  Continue to progress workloads to maintain intensity without signs/symptoms of physical distress.    Average METs  3.3  3.03  3.8  2.83      Resistance Training   Training Prescription  Yes  Yes  Yes  Yes    Weight  4 lbs  4 lbs  4 lbs  4 lbs    Reps  10-15  10-15  10-15  10-15      Interval Training   Interval Training  No  No  No  No      Treadmill   MPH  2.5  2.5  2.5  2.5    Grade  0  0  0  0    Minutes  '15  15  15  15    ' METs  2.91  2.91  2.91  2.91      NuStep   Level  '4  4  5  5    ' Minutes  '15  15  15  15    ' METs  2.8  3.9  3.8  3.1      REL-XR   Level  '5  5  6  6    ' Minutes  '15  15  15  15    ' METs  4.2  2.3  4.7  2.5      Home Exercise Plan   Plans to continue exercise at  Home (comment) walking and treadmill  Home (comment) walking and treadmill  Home (comment) walking and treadmill  Home (comment) walking and treadmill    Frequency  Add 3 additional days to program exercise sessions.  Add 3 additional days to program exercise sessions.  Add 3 additional days to program exercise sessions.  Add 3 additional days to program exercise sessions.    Initial Home Exercises Provided  03/27/18  03/27/18  03/27/18  03/27/18       Exercise Comments: Exercise Comments    Row Name 03/18/18 7944           Exercise Comments  First full day of exercise!  Patient was oriented to gym and equipment including functions, settings,  policies, and procedures.  Patient's individual exercise prescription and treatment plan were reviewed.  All starting workloads were established based on the results of the 6 minute walk test done at initial orientation visit.  The plan for exercise progression was also introduced and progression will be customized based on patient's performance and goals.          Exercise Goals and Review: Exercise Goals    Row Name 03/10/18 1301             Exercise Goals   Increase Physical Activity  Yes       Intervention  Provide advice, education, support and counseling about physical activity/exercise needs.;Develop an individualized exercise prescription for aerobic and resistive training based on initial evaluation findings, risk stratification, comorbidities and participant's personal goals.  Expected Outcomes  Short Term: Attend rehab on a regular basis to increase amount of physical activity.;Long Term: Exercising regularly at least 3-5 days a week.;Long Term: Add in home exercise to make exercise part of routine and to increase amount of physical activity.       Increase Strength and Stamina  Yes       Intervention  Provide advice, education, support and counseling about physical activity/exercise needs.;Develop an individualized exercise prescription for aerobic and resistive training based on initial evaluation findings, risk stratification, comorbidities and participant's personal goals.       Expected Outcomes  Short Term: Increase workloads from initial exercise prescription for resistance, speed, and METs.;Short Term: Perform resistance training exercises routinely during rehab and add in resistance training at home;Long Term: Improve cardiorespiratory fitness, muscular endurance and strength as measured by increased METs and functional capacity (6MWT)       Able to understand and use rate of perceived exertion (RPE) scale  Yes       Intervention  Provide education and explanation on how  to use RPE scale       Expected Outcomes  Short Term: Able to use RPE daily in rehab to express subjective intensity level;Long Term:  Able to use RPE to guide intensity level when exercising independently       Able to understand and use Dyspnea scale  Yes       Intervention  Provide education and explanation on how to use Dyspnea scale       Expected Outcomes  Short Term: Able to use Dyspnea scale daily in rehab to express subjective sense of shortness of breath during exertion;Long Term: Able to use Dyspnea scale to guide intensity level when exercising independently       Knowledge and understanding of Target Heart Rate Range (THRR)  Yes       Intervention  Provide education and explanation of THRR including how the numbers were predicted and where they are located for reference       Expected Outcomes  Short Term: Able to state/look up THRR;Short Term: Able to use daily as guideline for intensity in rehab;Long Term: Able to use THRR to govern intensity when exercising independently       Able to check pulse independently  Yes       Intervention  Provide education and demonstration on how to check pulse in carotid and radial arteries.;Review the importance of being able to check your own pulse for safety during independent exercise       Expected Outcomes  Short Term: Able to explain why pulse checking is important during independent exercise;Long Term: Able to check pulse independently and accurately       Understanding of Exercise Prescription  Yes       Intervention  Provide education, explanation, and written materials on patient's individual exercise prescription       Expected Outcomes  Short Term: Able to explain program exercise prescription;Long Term: Able to explain home exercise prescription to exercise independently          Exercise Goals Re-Evaluation : Exercise Goals Re-Evaluation    Row Name 03/18/18 0929 03/25/18 1134 03/27/18 1003 04/08/18 1458 04/22/18 1624     Exercise  Goal Re-Evaluation   Exercise Goals Review  Increase Physical Activity;Increase Strength and Stamina;Able to understand and use rate of perceived exertion (RPE) scale;Knowledge and understanding of Target Heart Rate Range (THRR);Understanding of Exercise Prescription  Increase Physical Activity;Increase Strength and Stamina;Understanding of Exercise Prescription  Increase Physical Activity;Increase Strength and Stamina;Understanding of Exercise Prescription;Able to understand and use rate of perceived exertion (RPE) scale;Knowledge and understanding of Target Heart Rate Range (THRR);Able to check pulse independently  Increase Physical Activity;Increase Strength and Stamina;Able to check pulse independently  Increase Physical Activity;Increase Strength and Stamina;Able to check pulse independently   Comments  Reviewed RPE scale, THR and program prescription with pt today.  Pt voiced understanding and was given a copy of goals to take home.   Mcguire is off to a good start in rehab again.  He has been playing golf some on his off days, but we will need to talk about adding in home exercise.  He is up to 2.7 METs on the NuStep.  We will start to increase his workloads and monitor his progression.   Reviewed home exercise with pt today.  Pt plans to walk and use treadmill at home for exercise.  Reviewed THR, pulse, RPE, sign and symptoms, and when to call 911 or MD.  Also discussed weather considerations and indoor options.  Pt voiced understanding.  Shota continues to do well in rehab.  He is now up to level 5 on the XR and level 4 on the NuStep.  We will continue to monitor his progres.   Justus has been doing well in rehab.  He will need to add some incline to his treadmill.  We will continue to monitor his progression.   Expected Outcomes  Short: Use RPE daily to regulate intensity. Long: Follow program prescription in THR.  Short: Review home exercise guidelines.   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   Short: Start walking at least one extra day a week at home.  Long: Continue to exercise independently  Short: Continue to increase treadmill.  Long: Continue to increase strength and stamina.   Short: Add incline to treadmill.  Long: Continue to walk on off days.    [place] Name 05/05/18 1647 05/13/18 1016 05/20/18 1034 06/03/18 1000 06/17/18 1612     Exercise Goal Re-Evaluation   Exercise Goals Review  Increase Physical Activity;Increase Strength and Stamina;Understanding of Exercise Prescription  Increase Physical Activity;Increase Strength and Stamina;Understanding of Exercise Prescription  Increase Physical Activity;Increase Strength and Stamina;Able to understand and use rate of perceived exertion (RPE) scale;Knowledge and understanding of Target Heart Rate Range (THRR);Able to check pulse independently;Understanding of Exercise Prescription  Increase Physical Activity;Increase Strength and Stamina;Understanding of Exercise Prescription  Increase Physical Activity;Increase Strength and Stamina;Understanding of Exercise Prescription   Comments  Kayhan has been doing well in rehab.  He was out of town last week.  He still needs to add incline to his treadmill as he has only attended once since last review.  We will continue to monitor his progress.   Natnael has been doing well in rehab.  He made up a few classes in the afternoon.  He has not started to add in exercise as he has been busy.  He does play golf on Wednesday so is staying active.  He does have a treadmill at home but it is in the attic. He is going to work on getting it down from there.Tera Mater does want to try the Elliptical for something new. He is going to join [employer]. He will ask someone to help him get his treadmill down.   Clem has been doing well in rehab.  He has not gotten on the elliptical yet, but still would like to try.  We will  encourage him to switch it in to his rotation on his next visit.  We will  continue to monitor his progression.   Makyle continues to do well in rehab.  He still has not tried the elliptical.  We will continue to encourage him to try.  He is up to level 5 on the NuStep.  We will continue to monitor his progress.    Expected Outcomes  Short: Add incline to treadmill.  Long: Continue to walk on off days.   Short: Get treadmill out of attic.  Long: Add in home exercise at least one extra day a week.   Short: get treadmill out of attic, join the [employer] [facility] [place]. Long: Commit to working out outside of Cardiac Rehab.  Short: Try the elliptical.  Long: Continue to exercise more on off days, besides just playing a round of golf.   Short: Try ellipitcal.  Long: Continue to exercie more   Row Name 07/02/18 1341             Exercise Goal Re-Evaluation   Exercise Goals Review  Increase Physical Activity;Increase Strength and Stamina;Understanding of Exercise Prescription       Comments  Marsh is nearing graduation on 2/7.  We will doing his walk test next week and expect to see a good improvement.  We will continue to monitor his progress.        Expected Outcomes  Short: Improve post 6MWT.  Long: Continue to exercise independently          Discharge Exercise Prescription (Final Exercise Prescription Changes): Exercise Prescription Changes - 07/02/18 1300      Response to Exercise   Blood Pressure (Admit)  126/70    Blood Pressure (Exercise)  132/74    Blood Pressure (Exit)  98/58    Heart Rate (Admit)  58 bpm    Heart Rate (Exercise)  79 bpm    Heart Rate (Exit)  60 bpm    Rating of Perceived Exertion (Exercise)  12    Symptoms  none    Duration  Continue with 30 min of aerobic exercise without signs/symptoms of physical distress.    Intensity  THRR unchanged      Progression   Progression  Continue to progress workloads to maintain intensity without signs/symptoms of physical distress.    Average METs  2.83      Resistance Training    Training Prescription  Yes    Weight  4 lbs    Reps  10-15      Interval Training   Interval Training  No      Treadmill   MPH  2.5    Grade  0    Minutes  15    METs  2.91      NuStep   Level  5    Minutes  15    METs  3.1      REL-XR   Level  6    Minutes  15    METs  2.5      Home Exercise Plan   Plans to continue exercise at  Home (comment)   walking and treadmill   Frequency  Add 3 additional days to program exercise sessions.    Initial Home Exercises Provided  03/27/18       Nutrition:  Target Goals: Understanding of nutrition guidelines, daily intake of sodium <1519m, cholesterol <2041m calories 30% from fat and 7% or less from saturated  fats, daily to have 5 or more servings of fruits and vegetables.  Biometrics: Pre Biometrics - 03/10/18 1302      Pre Biometrics   Height  6' 3.5" (1.918 m)    Weight  230 lb 1.6 oz (104.4 kg)    Waist Circumference  40 inches    Hip Circumference  43 inches    Waist to Hip Ratio  0.93 %    BMI (Calculated)  28.37    Single Leg Stand  5.73 seconds      Post Biometrics - 07/08/18 1205       Post  Biometrics   Height  6' 3.5" (1.918 m)    Weight  241 lb (109.3 kg)    Waist Circumference  42 inches    Hip Circumference  43 inches    Waist to Hip Ratio  0.98 %    BMI (Calculated)  29.72    Single Leg Stand  3.48 seconds       Nutrition Therapy Plan and Nutrition Goals: Nutrition Therapy & Goals - 03/18/18 1040      Nutrition Therapy   Diet  DM/ TLC   following the Mediterranean diet   Protein (specify units)  12oz    Fiber  35 grams    Whole Grain Foods  3 servings   chooses whole grains such as brown rice and whole grain bread   Saturated Fats  15 max. grams    Fruits and Vegetables  8 servings/day   has increased fruit and vegetable intake over the past month dramaticlly   Sodium  1500 grams      Personal Nutrition Goals   Nutrition Goal  Continue to work on making recent dietary change to the  Mediterranean style of eating a lifestyle habit. Great job so far!    Personal Goal #2  Continue to minimize red / processed meat intake, reduce added sugar intake and drink sugar free beverages    Personal Goal #3  When eating out, look for nutritious menu options that align with the Mediterrean style of eating most often    Comments  He and his wife have changed their eating patterns and style since his heart attack 4-5 weeks ago. They have been following a low sodium Mediterrean diet and have embraced this style of eating well. They have been eating out less, looking for desserts with less added sugar IE yogurt + Fruit & Nuts, eaing less red meat, and eating more fruits/ vegetables/ whole grains. His wife has made the diet switch with him and his helping to prepare meals. He has been choosing eggs with wheat toast for breakfast most days. BG this week pre-prandial has been 100-120. His ultimate goal is to lower his A1C (previously 9.7).      Intervention Plan   Intervention  Prescribe, educate and counsel regarding individualized specific dietary modifications aiming towards targeted core components such as weight, hypertension, lipid management, diabetes, heart failure and other comorbidities.    Expected Outcomes  Short Term Goal: Understand basic principles of dietary content, such as calories, fat, sodium, cholesterol and nutrients.;Short Term Goal: A plan has been developed with personal nutrition goals set during dietitian appointment.;Long Term Goal: Adherence to prescribed nutrition plan.       Nutrition Assessments: Nutrition Assessments - 03/10/18 1100      MEDFICTS Scores   Pre Score  47       Nutrition Goals Re-Evaluation: Nutrition Goals Re-Evaluation    [place] Name 03/18/18 1057  04/10/18 1018 05/13/18 1107 06/12/18 0933       Goals   Nutrition Goal  Continue to work on making recent dietary change to the Mediterranean style of eating a lifestyle habit. Great job so far!   Continue to work on lifestyle change of eating a Mediterranean-style diet both at home and when eating out  Continue to work on the lifestyle-change of eating a Scientist, product/process development- style diet both at home and when eating out; Continue to minimize red meat / added sugar intake and to reduce sugar sweetened beverages  Maintain lifestyle change of following a Mediterranean style of eating both at home and when eating out; Continue to reduce intake of read meats, added sugar and sugar sweetened beverages    Comment  Since his heart attack 4-5 weeks ago he has adopted this way of eating as a completely new eating style. So far he feels that he can stick to the Mediterranean diet long-term and is learning to enjoy the foods and cooking at home more often  He and his wife have been eating a Mediterranean-style diet since his heart attack and have been able to stick with it "easily." They are eating more fish and try to pick options when eating out that align with this style of eating as well. For example, his wife purchased a Mediterranean salad dressing that she brings to restaurants.  He reports ability to maintain style of eating, along with his wife. He "fell off" for Thanksgiving but other than that day has easily been able to stick with this lifestyle change  He and his wife have moved into the maintenance stage of stages of change; they have successfully adopted these lifestyle changes    Expected Outcome  He will learn more about the [place] and experiment with recipes along with his wife to keep adherence high.  He will continue to be successful in implementing this change in eating style  Continue into the maintenance stage of the Mediteranean diet lifestyle change  Maintain lifestyle changes of eating a Mediterranean diet and limiting sugar sweetened beverages and other sources of added sugar      Personal Goal #2 Re-Evaluation   Personal Goal #2  Continue to minimize red/ processed meatintake, reduce  added sugar intake, and drink sugar free beverages  -  -  -      Personal Goal #3 Re-Evaluation   Personal Goal #3  When eating out, look for nutritious menu options that align with the Mediterrean style of eating most often  -  -  -       Nutrition Goals Discharge (Final Nutrition Goals Re-Evaluation): Nutrition Goals Re-Evaluation - 06/12/18 0933      Goals   Nutrition Goal  Maintain lifestyle change of following a Mediterranean style of eating both at home and when eating out; Continue to reduce intake of read meats, added sugar and sugar sweetened beverages    Comment  He and his wife have moved into the maintenance stage of stages of change; they have successfully adopted these lifestyle changes    Expected Outcome  Maintain lifestyle changes of eating a Mediterranean diet and limiting sugar sweetened beverages and other sources of added sugar       Psychosocial: Target Goals: Acknowledge presence or absence of significant depression and/or stress, maximize coping skills, provide positive support system. Participant is able to verbalize types and ability to use techniques and skills needed for reducing stress and depression.   Initial Review & Psychosocial  Screening: Initial Psych Review & Screening - 03/10/18 1255      Initial Review   Current issues with  Current Stress Concerns    Source of Stress Concerns  Unable to perform yard/household activities    Comments  Ryle wants to build back up his stamina so he can feel better daily. He states he sleeps well at night, but is still sleepy sometimes during the day. He is socially active, whether its eating breakfast with one of his sons or friends or playing golf weekly.       Family Dynamics   Good Support System?  Yes   spouse     Barriers   Psychosocial barriers to participate in program  There are no identifiable barriers or psychosocial needs.;The patient should benefit from training in stress management and relaxation.       Screening Interventions   Interventions  Encouraged to exercise;Program counselor consult    Expected Outcomes  Short Term goal: Utilizing psychosocial counselor, staff and physician to assist with identification of specific Stressors or current issues interfering with healing process. Setting desired goal for each stressor or current issue identified.;Long Term Goal: Stressors or current issues are controlled or eliminated.;Short Term goal: Identification and review with participant of any Quality of Life or Depression concerns found by scoring the questionnaire.;Long Term goal: The participant improves quality of Life and PHQ9 Scores as seen by post scores and/or verbalization of changes       Quality of Life Scores:  Quality of Life - 03/10/18 1258      Quality of Life   Select  Quality of Life      Quality of Life Scores   Health/Function Pre  22.8 %    Socioeconomic Pre  26.14 %    Psych/Spiritual Pre  27.43 %    Family Pre  25.2 %    GLOBAL Pre  24.79 %      Scores of 19 and below usually indicate a poorer quality of life in these areas.  A difference of  2-3 points is a clinically meaningful difference.  A difference of 2-3 points in the total score of the Quality of Life Index has been associated with significant improvement in overall quality of life, self-image, physical symptoms, and general health in studies assessing change in quality of life.  PHQ-9: Recent Review Flowsheet Data    Depression screen [facility] 2/9 03/10/2018 03/02/2015 11/29/2014   Decreased Interest 0 0 0   Down, Depressed, Hopeless 0 0 0   PHQ - 2 Score 0 0 0   Altered sleeping 0 1 -   Tired, decreased energy 3 1 -   Change in appetite 0 0 -   Feeling bad or failure about yourself  0 0 -   Trouble concentrating 0 0 -   Moving slowly or fidgety/restless 0 0 -   Suicidal thoughts 0 0 -   PHQ-9 Score 3 2 -   Difficult doing work/chores Not difficult at all Not difficult at all -     Interpretation of  Total Score  Total Score Depression Severity:  1-4 = Minimal depression, 5-9 = Mild depression, 10-14 = Moderate depression, 15-19 = Moderately severe depression, 20-27 = Severe depression   Psychosocial Evaluation and Intervention: Psychosocial Evaluation - 04/08/18 1006      Psychosocial Evaluation & Interventions   Interventions  Encouraged to exercise with the program and follow exercise prescription    Comments  Counselor met with Mr. Elvis Coil Tera Mater)  today for initial psychosocial evaluation.  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  He reports being in good health otherwise except for diabetes and sleep apnea (which is helped with a CPAP).  Rayner has a strong support system with a spouse of 62 years; (2) adult children locally and active involvement in his local church.  He sleeps well and has a good appetite.  Derrill denies a history of depression or anxiety or any current symptoms and his mood is typically positive.  Other than his health, he reports having minimal stress in his life at this time.  He has goals to get his strength back and lose some weight while in this program.  Staff will follow with Sayf throughout the course of this program.      Expected Outcomes  Short:  Malikye will get in a routine of exercise to increase his strength and stamina.  He will meet with the dietician to address his weight loss goal. Long:  [place] will develop a habit of positive self care with healthy eating and exercise being more consistent.     Continue Psychosocial Services   Follow up required by staff       Psychosocial Re-Evaluation: Psychosocial Re-Evaluation    [place] Name 05/13/18 1020 05/20/18 1040 06/19/18 1042         Psychosocial Re-Evaluation   Current issues with  Current Stress Concerns  Current Stress Concerns  Current Stress Concerns     Comments  Trentan is in a good place mentally. He is staying busy  with the holidays but still finds time to get out to play golf.  He was able to enjoy Thanksgiving and plans to do the same for Christmas and then get back to his diet quickly.  He is doing well with his diet even when he goes out with friends during the week.  He is sleeping good.  He has enjoyed coming to class regularly and getting back into a routine twice a week.   Zaron really doesn't have any stress concerns to report. He plays golf with his friends once a week. He doesn't like when this is interrupted by weather. Happy to see family for Christmas.   Ebert still reports sleeping well and has no other stress concerns.  He plans to continue having breakfaast with friends and [facility]     Expected Outcomes  Short: Continue to get in his round of golf for himself.  Long: Continue to stay postive.   Short: Continue to play golf. Long: Continue to handle what comes his way with a good attitude and spend time with Senior friends.  Short - have orientation at [facility] - maintain exercsie and stress mgmt upon completion of HT     Interventions  -  -  Encouraged to attend Cardiac Rehabilitation for the exercise     Continue Psychosocial Services   -  -  Follow up required by staff        Psychosocial Discharge (Final Psychosocial Re-Evaluation): Psychosocial Re-Evaluation - 06/19/18 1042      Psychosocial Re-Evaluation   Current issues with  Current Stress Concerns    Comments  Asriel still reports sleeping well and has no other stress concerns.  He plans to continue having breakfaast with friends and [facility]    Expected Outcomes  Short - have  orientation at [facility] - maintain exercsie and stress mgmt upon completion of HT    Interventions  Encouraged to attend Cardiac Rehabilitation for the exercise    Continue Psychosocial Services   Follow up required by staff       Vocational Rehabilitation: Provide vocational rehab assistance to  qualifying candidates.   Vocational Rehab Evaluation & Intervention: Vocational Rehab - 03/10/18 1255      Initial Vocational Rehab Evaluation & Intervention   Assessment shows need for Vocational Rehabilitation  No       Education: Education Goals: Education classes will be provided on a variety of topics geared toward better understanding of heart health and risk factor modification. Participant will state understanding/return demonstration of topics presented as noted by education test scores.  Learning Barriers/Preferences: Learning Barriers/Preferences - 03/10/18 1255      Learning Barriers/Preferences   Learning Barriers  None    Learning Preferences  None       Education Topics:  AED/CPR: - Group verbal and written instruction with the use of models to demonstrate the basic use of the AED with the basic ABC's of resuscitation.   Cardiac Rehab from 07/08/2018 in [facility] Cardiac and Pulmonary Rehab  Date  04/10/18  Educator  SB  Instruction Review Code  1- Verbalizes Understanding      General Nutrition Guidelines/Fats and Fiber: -Group instruction provided by verbal, written material, models and posters to present the general guidelines for heart healthy nutrition. Gives an explanation and review of dietary fats and fiber.   Cardiac Rehab from 07/08/2018 in [facility] Cardiac and Pulmonary Rehab  Date  04/15/18  Educator  LB  Instruction Review Code  5- Refused Teaching      Controlling Sodium/Reading Food Labels: -Group verbal and written material supporting the discussion of sodium use in heart healthy nutrition. Review and explanation with models, verbal and written materials for utilization of the food label.   Cardiac Rehab from 07/08/2018 in [facility] Cardiac and Pulmonary Rehab  Date  04/17/18  Educator  LB  Instruction Review Code  1- Verbalizes Understanding      Exercise Physiology & General Exercise Guidelines: - Group verbal and written instruction with models to  review the exercise physiology of the cardiovascular system and associated critical values. Provides general exercise guidelines with specific guidelines to those with heart or lung disease.    Aerobic Exercise & Resistance Training: - Gives group verbal and written instruction on the various components of exercise. Focuses on aerobic and resistive training programs and the benefits of this training and how to safely progress through these programs..   Flexibility, Balance, Mind/Body Relaxation: Provides group verbal/written instruction on the benefits of flexibility and balance training, including mind/body exercise modes such as yoga, pilates and tai chi.  Demonstration and skill practice provided.   Cardiac Rehab from 07/08/2018 in [facility] Cardiac and Pulmonary Rehab  Date  07/08/18  Educator  AS  Instruction Review Code  5- Refused Teaching      Stress and Anxiety: - Provides group verbal and written instruction about the health risks of elevated stress and causes of high stress.  Discuss the correlation between heart/lung disease and anxiety and treatment options. Review healthy ways to manage with stress and anxiety.   Depression: - Provides group verbal and written instruction on the correlation between heart/lung disease and depressed mood, treatment options, and the stigmas associated with seeking treatment.   Cardiac Rehab from 07/08/2018 in [facility] Cardiac and Pulmonary Rehab  Date  06/17/18  Educator  [place]  Instruction Review Code  5- Refused Teaching      Anatomy & Physiology of the Heart: - Group verbal and written instruction and models provide basic cardiac anatomy and physiology, with the coronary electrical and arterial systems. Review of Valvular disease and Heart Failure   Cardiac Procedures: - Group verbal and written instruction to review commonly prescribed medications for heart disease. Reviews the medication, class of the drug, and side effects. Includes the steps to  properly store meds and maintain the prescription regimen. (beta blockers and nitrates)   Cardiac Rehab from 07/08/2018 in [facility] Cardiac and Pulmonary Rehab  Date  05/20/18  Educator  SB  Instruction Review Code  5- Refused Teaching      Cardiac Medications I: - Group verbal and written instruction to review commonly prescribed medications for heart disease. Reviews the medication, class of the drug, and side effects. Includes the steps to properly store meds and maintain the prescription regimen.   Cardiac Rehab from 07/08/2018 in [facility] Cardiac and Pulmonary Rehab  Date  04/01/18  Educator  SB  Instruction Review Code  1- Verbalizes Understanding      Cardiac Medications II: -Group verbal and written instruction to review commonly prescribed medications for heart disease. Reviews the medication, class of the drug, and side effects. (all other drug classes)   Cardiac Rehab from 07/08/2018 in [facility] Cardiac and Pulmonary Rehab  Date  05/13/18  Educator  SB  Instruction Review Code  5- Refused Teaching       Go Sex-Intimacy & Heart Disease, Get SMART - Goal Setting: - Group verbal and written instruction through game format to discuss heart disease and the return to sexual intimacy. Provides group verbal and written material to discuss and apply goal setting through the application of the S.M.A.R.T. Method.   Cardiac Rehab from 07/08/2018 in [facility] Cardiac and Pulmonary Rehab  Date  05/20/18  Educator  SB  Instruction Review Code  5- Refused Teaching      Other Matters of the Heart: - Provides group verbal, written materials and models to describe Stable Angina and Peripheral Artery. Includes description of the disease process and treatment options available to the cardiac patient.   Exercise & Equipment Safety: - Individual verbal instruction and demonstration of equipment use and safety with use of the equipment.   Cardiac Rehab from 07/08/2018 in [facility] Cardiac and Pulmonary Rehab   Date  03/10/18  Educator  [facility]  Instruction Review Code  1- Verbalizes Understanding      Infection Prevention: - Provides verbal and written material to individual with discussion of infection control including proper hand washing and proper equipment cleaning during exercise session.   Falls Prevention: - Provides verbal and written material to individual with discussion of falls prevention and safety.   Cardiac Rehab from 07/08/2018 in [facility] Cardiac and Pulmonary Rehab  Date  03/10/18  Educator  [facility]  Instruction Review Code  1- Verbalizes Understanding      Diabetes: - Individual verbal and written instruction to review signs/symptoms of diabetes, desired ranges of glucose level fasting, after meals and with exercise. Acknowledge that pre and post exercise glucose checks will be done for 3 sessions at entry of program.   Cardiac Rehab from 07/08/2018 in [facility] Cardiac and Pulmonary Rehab  Date  03/10/18  Educator  [facility]  Instruction Review Code  1- Verbalizes Understanding      Know Your Numbers and Risk Factors: -Group verbal and written instruction  about important numbers in your health.  Discussion of what are risk factors and how they play a role in the disease process.  Review of Cholesterol, Blood Pressure, Diabetes, and BMI and the role they play in your overall health.   Cardiac Rehab from 07/08/2018 in [facility] Cardiac and Pulmonary Rehab  Date  05/13/18  Educator  SB  Instruction Review Code  5- Refused Teaching      Sleep Hygiene: -Provides group verbal and written instruction about how sleep can affect your health.  Define sleep hygiene, discuss sleep cycles and impact of sleep habits. Review good sleep hygiene tips.    Other: -Provides group and verbal instruction on various topics (see comments)   Knowledge Questionnaire Score: Knowledge Questionnaire Score - 03/10/18 1100      Knowledge Questionnaire Score   Pre Score  23/26   test reviewed with pt today       Core Components/Risk Factors/Patient Goals at Admission: Personal Goals and Risk Factors at Admission - 03/10/18 1253      Core Components/Risk Factors/Patient Goals on Admission    Weight Management  Yes;Weight Loss;Obesity    Intervention  Weight Management: Develop a combined nutrition and exercise program designed to reach desired caloric intake, while maintaining appropriate intake of nutrient and fiber, sodium and fats, and appropriate energy expenditure required for the weight goal.;Weight Management: Provide education and appropriate resources to help participant work on and attain dietary goals.;Obesity: Provide education and appropriate resources to help participant work on and attain dietary goals.;Weight Management/Obesity: Establish reasonable short term and long term weight goals.    Admit Weight  230 lb (104.3 kg)    Goal Weight: Short Term  225 lb (102.1 kg)    Goal Weight: Long Term  210 lb (95.3 kg)    Expected Outcomes  Short Term: Continue to assess and modify interventions until short term weight is achieved;Long Term: Adherence to nutrition and physical activity/exercise program aimed toward attainment of established weight goal;Understanding recommendations for meals to include 15-35% energy as protein, 25-35% energy from fat, 35-60% energy from carbohydrates, less than 217m of dietary cholesterol, 20-35 gm of total fiber daily;Understanding of distribution of calorie intake throughout the day with the consumption of 4-5 meals/snacks;Weight Loss: Understanding of general recommendations for a balanced deficit meal plan, which promotes 1-2 lb weight loss per week and includes a negative energy balance of [id number] kcal/d    Diabetes  Yes    Intervention  Provide education about signs/symptoms and action to take for hypo/hyperglycemia.;Provide education about proper nutrition, including hydration, and aerobic/resistive exercise prescription along with prescribed medications to  achieve blood glucose in normal ranges: Fasting glucose 65-99 mg/dL    Expected Outcomes  Short Term: Participant verbalizes understanding of the signs/symptoms and immediate care of hyper/hypoglycemia, proper foot care and importance of medication, aerobic/resistive exercise and nutrition plan for blood glucose control.;Long Term: Attainment of HbA1C < 7%.    Hypertension  Yes    Intervention  Provide education on lifestyle modifcations including regular physical activity/exercise, weight management, moderate sodium restriction and increased consumption of fresh fruit, vegetables, and low fat dairy, alcohol moderation, and smoking cessation.;Monitor prescription use compliance.    Expected Outcomes  Short Term: Continued assessment and intervention until BP is < 140/919mHG in hypertensive participants. < 130/8030mG in hypertensive participants with diabetes, heart failure or chronic kidney disease.;Long Term: Maintenance of blood pressure at goal levels.    Lipids  Yes    Intervention  Provide education  and support for participant on nutrition & aerobic/resistive exercise along with prescribed medications to achieve LDL <11m, HDL >447m    Expected Outcomes  Short Term: Participant states understanding of desired cholesterol values and is compliant with medications prescribed. Participant is following exercise prescription and nutrition guidelines.;Long Term: Cholesterol controlled with medications as prescribed, with individualized exercise RX and with personalized nutrition plan. Value goals: LDL < 703mHDL > 40 mg.       Core Components/Risk Factors/Patient Goals Review:  Goals and Risk Factor Review    Row Name 03/25/18 1037 05/13/18 1023 05/20/18 1042 06/19/18 1044       Core Components/Risk Factors/Patient Goals Review   Personal Goals Review  Weight Management/Obesity;Hypertension;Diabetes;Lipids  Weight Management/Obesity;Hypertension;Diabetes;Lipids  Diabetes;Hypertension;Lipids;Weight  Management/Obesity  Weight Management/Obesity;Diabetes;Hypertension;Lipids    Review  HarIfeoluwa off to a good start in rehab.  He is working to get his weight down to 210lbs at home.  Currently he is averaging around 215lbs.  He is doing well with his blood sugars at usually around 100-120s in the morning.  He checks them every morning and usualy once in the afternoon or evening as well.   His has does not check his blood pressures at home as he does not have a cuff, but we talked about getting a new one to check at home.  He is doing well with his medications and has no problems.   HarKingstins been maintaining his weight for the most part. He is up a little today after Thanksgiving.  He is around 225 lbs at home.  He is sure that it will come back down.  He continues to check his sugars regularly at home.  His A1c is down to 7.1 so he has better control.   He is still not checking his pressures at home and has not gotten a new cuff.  He feels comfortable with where his pressures are here and in the office.  He is still doing well with his medicaitons.   HarTywaun doing a Mediterranean nutrition plan with his wife. He checks blood sugar every morning. He does not have a BP machine. I recommended that he buy one to have at home to check his BP and also his wife's bp if ever needed. I  HarTera Materd his wife still follow Med diet.  He has not purchased a home BP monitor yet.  FBG have been 104, 123 past 2 days.  He reports taking all meds as directed.  [facility]    Expected Outcomes  Short: Get new blood pressure cuff to check at home.  Long: Continue to work on weight loss.   Short: Continue to check sugars regularly and get weight back down.  Long: Continue to work on weight loss.   Short: Continue to check sugars, eat healthy, and buy a bp machine which we will check for accuracy for him. Long: Continue to work on weight loss and take medications as prescribed.  Short -  complete HT Long - maintain exercise ond diet on his own       Core Components/Risk Factors/Patient Goals at Discharge (Final Review):  Goals and Risk Factor Review - 06/19/18 1044      Core Components/Risk Factors/Patient Goals Review   Personal Goals Review  Weight Management/Obesity;Diabetes;Hypertension;Lipids    Review  Thane and his wife still follow Med diet.  He has not purchased a home BP monitor yet.  FBG have been  104, 123 past 2 days.  He reports taking all meds as directed.  [facility].    Expected Outcomes  Short - complete HT Long - maintain exercise ond diet on his own       ITP Comments: ITP Comments    Row Name 03/10/18 1249 03/19/18 0636 03/25/18 1036 04/16/18 0602 05/14/18 0609   ITP Comments  Med Review completed. Initial ITP created. Diagnosis can be found in Care Everywhere 9/3  30 day review.  Continue with ITP unless directed changes per Medical Director review.  New to program  Fallou has been to most of the education classes before, but we talked about making sure he comes in for some of them that are newer.   30 day review. Continue with ITP unless direccted changes per Medical Director Chart Review.  30 day review. Continue with ITP unless direccted changes per Medical Director Chart Review.   [place] Name 06/10/18 0636 07/09/18 1349         ITP Comments  30 Day Review. Continue with ITP unless directed changes per Medical Director review  30 day review completed. ITP sent to Dr. Emily Filbert, Medical Director of Cardiac Rehab. Continue with ITP unless changes are made by physician.         Comments: 30 day review

## 2018-07-10 ENCOUNTER — Encounter: Payer: Medicare Other | Admitting: *Deleted

## 2018-07-10 DIAGNOSIS — I213 ST elevation (STEMI) myocardial infarction of unspecified site: Secondary | ICD-10-CM | POA: Diagnosis not present

## 2018-07-10 DIAGNOSIS — Z9861 Coronary angioplasty status: Secondary | ICD-10-CM

## 2018-07-10 NOTE — Progress Notes (Signed)
Daily Session Note  Patient Details  Name: Brandon Wall MRN: [id number] Date of Birth: 06-Jan-1941 Referring Provider:     Cardiac Rehab from 03/10/2018 in [facility]  Referring Provider  Chanetta Marshall MD      Encounter Date: 07/10/2018  Check In: Session Check In - 07/10/18 1304      Check-In   Supervising physician immediately available to respond to emergencies  See telemetry face sheet for immediately available ER MD    Location  [facility]-Cardiac & Pulmonary Rehab    Staff Present  Jasper Loser BS, Exercise Physiologist;Jessica Luan Pulling, MA, RCEP, CCRP, Exercise Physiologist;Carroll Enterkin, RN, BSN    Medication changes reported      No    Fall or balance concerns reported     No    Warm-up and Cool-down  Performed on first and last piece of equipment    Resistance Training Performed  No   arrived late and left early   VAD Patient?  No    PAD/SET Patient?  No      Pain Assessment   Currently in Pain?  No/denies          Social History   Tobacco Use  Smoking Status Never Smoker  Smokeless Tobacco Never Used    Goals Met:  Independence with exercise equipment Exercise tolerated well No report of cardiac concerns or symptoms Strength training completed today  Goals Unmet:  Not Applicable  Comments: Pt able to follow exercise prescription today without complaint.  Will continue to monitor for progression.    Dr. Emily Filbert is Medical Director for [facility] and [name].

## 2018-07-15 ENCOUNTER — Encounter: Payer: Medicare Other | Attending: Cardiovascular Disease | Admitting: *Deleted

## 2018-07-15 DIAGNOSIS — Z79899 Other long term (current) drug therapy: Secondary | ICD-10-CM | POA: Insufficient documentation

## 2018-07-15 DIAGNOSIS — I213 ST elevation (STEMI) myocardial infarction of unspecified site: Secondary | ICD-10-CM | POA: Insufficient documentation

## 2018-07-15 DIAGNOSIS — Z9861 Coronary angioplasty status: Secondary | ICD-10-CM

## 2018-07-15 NOTE — Progress Notes (Signed)
Daily Session Note  Patient Details  Name: Brandon Wall MRN: [id number] Date of Birth: [birth year] Referring Provider:     Cardiac Rehab from 03/10/2018 in [facility]  Referring Provider  Chanetta Marshall MD      Encounter Date: 07/15/2018  Check In: Session Check In - 07/15/18 0941      Check-In   Supervising physician immediately available to respond to emergencies  See telemetry face sheet for immediately available ER MD    Location  [facility]-Cardiac & Pulmonary Re[facility]    Staff Present  Heath Lark, RN, BSN, CCRP;Jeanna Durrell BS, Exercise Physiologist;Renley Banwart [place]wood, MA, RCEP, CCRP, Exercise Physiologist    Medication changes reported      No    Fall or balance concerns reported     No    Warm-up and Cool-down  Performed as group-led instruction    Resistance Training Performed  Yes    VAD Patient?  No    PAD/SET Patient?  No      Pain Assessment   Currently in Pain?  No/denies          Social History   Tobacco Use  Smoking Status Never Smoker  Smokeless Tobacco Never Used    Goals Met:  Independence with exercise equipment Exercise tolerated well No report of cardiac concerns or symptoms Strength training completed today  Goals Unmet:  Not Applicable  Comments: Pt able to follow exercise prescription today without complaint.  Will continue to monitor for progression.    Dr. Emily Filbert is Medical Director for [name].

## 2018-07-15 NOTE — Patient Instructions (Signed)
Discharge Patient Instructions  Patient Details  Name: Brandon Wall MRN: [id number] Date of Birth: 29-Jun-1940 Referring Provider:  Eliseo Gum*   Number of Visits: 30  Reason for Discharge:  Patient reached a stable level of exercise. Patient independent in their exercise. Patient has met program and personal goals.  Smoking History:  Social History   Tobacco Use  Smoking Status Never Smoker  Smokeless Tobacco Never Used    Diagnosis:  ST elevation myocardial infarction (STEMI), unspecified artery (HCC)  S/P PTCA (percutaneous transluminal coronary angioplasty)  Initial Exercise Prescription: Initial Exercise Prescription - 03/10/18 1200      Date of Initial Exercise RX and Referring Provider   Date  03/10/18    Referring Provider  Chanetta Marshall MD      Treadmill   MPH  2.1    Grade  0.5    Minutes  15    METs  2.75      NuStep   Level  2    SPM  80    Minutes  15    METs  2.5      REL-XR   Level  1    Speed  50    Minutes  15    METs  2.5      Prescription Details   Frequency (times per week)  2    Duration  Progress to 30 minutes of continuous aerobic without signs/symptoms of physical distress      Intensity   THRR 40-80% of Max Heartrate  92-125    Ratings of Perceived Exertion  11-13    Perceived Dyspnea  0-4      Progression   Progression  Continue to progress workloads to maintain intensity without signs/symptoms of physical distress.      Resistance Training   Training Prescription  Yes    Weight  3 lbs    Reps  10-15       Discharge Exercise Prescription (Final Exercise Prescription Changes): Exercise Prescription Changes - 07/02/18 1300      Response to Exercise   Blood Pressure (Admit)  126/70    Blood Pressure (Exercise)  132/74    Blood Pressure (Exit)  98/58    Heart Rate (Admit)  58 bpm    Heart Rate (Exercise)  79 bpm    Heart Rate (Exit)  60 bpm    Rating of Perceived Exertion (Exercise)  12    Symptoms  none    Duration  Continue with 30 min of aerobic exercise without signs/symptoms of physical distress.    Intensity  THRR unchanged      Progression   Progression  Continue to progress workloads to maintain intensity without signs/symptoms of physical distress.    Average METs  2.83      Resistance Training   Training Prescription  Yes    Weight  4 lbs    Reps  10-15      Interval Training   Interval Training  No      Treadmill   MPH  2.5    Grade  0    Minutes  15    METs  2.91      NuStep   Level  5    Minutes  15    METs  3.1      REL-XR   Level  6    Minutes  15    METs  2.5      Home Exercise Plan   Plans to  continue exercise at  Home (comment)   walking and treadmill   Frequency  Add 3 additional days to program exercise sessions.    Initial Home Exercises Provided  03/27/18       Functional Capacity: 6 Minute Walk    Row Name 03/10/18 1250 07/08/18 1204       6 Minute Walk   Phase  Initial  Discharge    Distance  1468 feet  1546 feet    Distance % Change  -  5.3 %    Distance Feet Change  -  78 ft    Walk Time  6 minutes  6 minutes    # of Rest Breaks  0  0    MPH  2.78  2.92    METS  2.64  2.62    RPE  12  13    VO2 Peak  9.26  9.19    Symptoms  No  No    Resting HR  58 bpm  84 bpm    Resting BP  126/64  120/78    Resting Oxygen Saturation   98 %  97 %    Exercise Oxygen Saturation  during 6 min walk  98 %  97 %    Max Ex. HR  81 bpm  100 bpm    Max Ex. BP  134/68  140/68    2 Minute Post BP  112/66  -       Quality of Life: Quality of Life - 07/15/18 1039      Quality of Life Scores   Health/Function Pre  22.8 %    Health/Function Post  25.2 %    Health/Function % Change  10.53 %    Socioeconomic Pre  26.14 %    Socioeconomic Post  26.57 %    Socioeconomic % Change   1.64 %    Psych/Spiritual Pre  27.43 %    Psych/Spiritual Post  28.29 %    Psych/Spiritual % Change  3.14 %    Family Pre  25.2 %    Family Post  27.6 %     Family % Change  9.52 %    GLOBAL Pre  24.79 %    GLOBAL Post  26.47 %    GLOBAL % Change  6.78 %       Personal Goals: Goals established at orientation with interventions provided to work toward goal. Personal Goals and Risk Factors at Admission - 03/10/18 1253      Core Components/Risk Factors/Patient Goals on Admission    Weight Management  Yes;Weight Loss;Obesity    Intervention  Weight Management: Develop a combined nutrition and exercise program designed to reach desired caloric intake, while maintaining appropriate intake of nutrient and fiber, sodium and fats, and appropriate energy expenditure required for the weight goal.;Weight Management: Provide education and appropriate resources to help participant work on and attain dietary goals.;Obesity: Provide education and appropriate resources to help participant work on and attain dietary goals.;Weight Management/Obesity: Establish reasonable short term and long term weight goals.    Admit Weight  230 lb (104.3 kg)    Goal Weight: Short Term  225 lb (102.1 kg)    Goal Weight: Long Term  210 lb (95.3 kg)    Expected Outcomes  Short Term: Continue to assess and modify interventions until short term weight is achieved;Long Term: Adherence to nutrition and physical activity/exercise program aimed toward attainment of established weight goal;Understanding recommendations for meals to include 15-35% energy as  protein, 25-35% energy from fat, 35-60% energy from carbohydrates, less than '200mg'$  of dietary cholesterol, 20-35 gm of total fiber daily;Understanding of distribution of calorie intake throughout the day with the consumption of 4-5 meals/snacks;Weight Loss: Understanding of general recommendations for a balanced deficit meal plan, which promotes 1-2 lb weight loss per week and includes a negative energy balance of [phone] kcal/d    Diabetes  Yes    Intervention  Provide education about signs/symptoms and action to take for  hypo/hyperglycemia.;Provide education about proper nutrition, including hydration, and aerobic/resistive exercise prescription along with prescribed medications to achieve blood glucose in normal ranges: Fasting glucose 65-99 mg/dL    Expected Outcomes  Short Term: Participant verbalizes understanding of the signs/symptoms and immediate care of hyper/hypoglycemia, proper foot care and importance of medication, aerobic/resistive exercise and nutrition plan for blood glucose control.;Long Term: Attainment of HbA1C < 7%.    Hypertension  Yes    Intervention  Provide education on lifestyle modifcations including regular physical activity/exercise, weight management, moderate sodium restriction and increased consumption of fresh fruit, vegetables, and low fat dairy, alcohol moderation, and smoking cessation.;Monitor prescription use compliance.    Expected Outcomes  Short Term: Continued assessment and intervention until BP is < 140/28m HG in hypertensive participants. < 130/865mHG in hypertensive participants with diabetes, heart failure or chronic kidney disease.;Long Term: Maintenance of blood pressure at goal levels.    Lipids  Yes    Intervention  Provide education and support for participant on nutrition & aerobic/resistive exercise along with prescribed medications to achieve LDL '70mg'$ , HDL >'40mg'$ .    Expected Outcomes  Short Term: Participant states understanding of desired cholesterol values and is compliant with medications prescribed. Participant is following exercise prescription and nutrition guidelines.;Long Term: Cholesterol controlled with medications as prescribed, with individualized exercise RX and with personalized nutrition plan. Value goals: LDL < '70mg'$ , HDL > 40 mg.        Personal Goals Discharge: Goals and Risk Factor Review - 06/19/18 1044      Core Components/Risk Factors/Patient Goals Review   Personal Goals Review  Weight Management/Obesity;Diabetes;Hypertension;Lipids     Review  Graham and his wife still follow Med diet.  He has not purchased a home BP monitor yet.  FBG have been 104, 123 past 2 days.  He reports taking all meds as directed.  [facility]    Expected Outcomes  Short - complete HT Long - maintain exercise ond diet on his own       Exercise Goals and Review: Exercise Goals    Row Name 03/10/18 1301             Exercise Goals   Increase Physical Activity  Yes       Intervention  Provide advice, education, support and counseling about physical activity/exercise needs.;Develop an individualized exercise prescription for aerobic and resistive training based on initial evaluation findings, risk stratification, comorbidities and participant's personal goals.       Expected Outcomes  Short Term: Attend rehab on a regular basis to increase amount of physical activity.;Long Term: Exercising regularly at least 3-5 days a week.;Long Term: Add in home exercise to make exercise part of routine and to increase amount of physical activity.       Increase Strength and Stamina  Yes       Intervention  Provide advice, education, support and counseling about physical activity/exercise needs.;Develop an individualized exercise prescription for aerobic and resistive training  based on initial evaluation findings, risk stratification, comorbidities and participant's personal goals.       Expected Outcomes  Short Term: Increase workloads from initial exercise prescription for resistance, speed, and METs.;Short Term: Perform resistance training exercises routinely during rehab and add in resistance training at home;Long Term: Improve cardiorespiratory fitness, muscular endurance and strength as measured by increased METs and functional capacity (6MWT)       Able to understand and use rate of perceived exertion (RPE) scale  Yes       Intervention  Provide education and explanation on how to use RPE scale       Expected Outcomes   Short Term: Able to use RPE daily in rehab to express subjective intensity level;Long Term:  Able to use RPE to guide intensity level when exercising independently       Able to understand and use Dyspnea scale  Yes       Intervention  Provide education and explanation on how to use Dyspnea scale       Expected Outcomes  Short Term: Able to use Dyspnea scale daily in rehab to express subjective sense of shortness of breath during exertion;Long Term: Able to use Dyspnea scale to guide intensity level when exercising independently       Knowledge and understanding of Target Heart Rate Range (THRR)  Yes       Intervention  Provide education and explanation of THRR including how the numbers were predicted and where they are located for reference       Expected Outcomes  Short Term: Able to state/look up THRR;Short Term: Able to use daily as guideline for intensity in rehab;Long Term: Able to use THRR to govern intensity when exercising independently       Able to check pulse independently  Yes       Intervention  Provide education and demonstration on how to check pulse in carotid and radial arteries.;Review the importance of being able to check your own pulse for safety during independent exercise       Expected Outcomes  Short Term: Able to explain why pulse checking is important during independent exercise;Long Term: Able to check pulse independently and accurately       Understanding of Exercise Prescription  Yes       Intervention  Provide education, explanation, and written materials on patient's individual exercise prescription       Expected Outcomes  Short Term: Able to explain program exercise prescription;Long Term: Able to explain home exercise prescription to exercise independently          Exercise Goals Re-Evaluation: Exercise Goals Re-Evaluation    Row Name 03/18/18 0929 03/25/18 1134 03/27/18 1003 04/08/18 1458 04/22/18 1624     Exercise Goal Re-Evaluation   Exercise Goals Review   Increase Physical Activity;Increase Strength and Stamina;Able to understand and use rate of perceived exertion (RPE) scale;Knowledge and understanding of Target Heart Rate Range (THRR);Understanding of Exercise Prescription  Increase Physical Activity;Increase Strength and Stamina;Understanding of Exercise Prescription  Increase Physical Activity;Increase Strength and Stamina;Understanding of Exercise Prescription;Able to understand and use rate of perceived exertion (RPE) scale;Knowledge and understanding of Target Heart Rate Range (THRR);Able to check pulse independently  Increase Physical Activity;Increase Strength and Stamina;Able to check pulse independently  Increase Physical Activity;Increase Strength and Stamina;Able to check pulse independently   Comments  Reviewed RPE scale, THR and program prescription with pt today.  Pt voiced understanding and was given a copy of goals to take home.  Shadrach is off to a good start in rehab again.  He has been playing golf some on his off days, but we will need to talk about adding in home exercise.  He is up to 2.7 METs on the NuStep.  We will start to increase his workloads and monitor his progression.   Reviewed home exercise with pt today.  Pt plans to walk and use treadmill at home for exercise.  Reviewed THR, pulse, RPE, sign and symptoms, and when to call 911 or MD.  Also discussed weather considerations and indoor options.  Pt voiced understanding.  Bubba continues to do well in rehab.  He is now up to level 5 on the XR and level 4 on the NuStep.  We will continue to monitor his progres.   Goebel has been doing well in rehab.  He will need to add some incline to his treadmill.  We will continue to monitor his progression.   Expected Outcomes  Short: Use RPE daily to regulate intensity. Long: Follow program prescription in THR.  Short: Review home exercise guidelines.   Long: Continue to work on Printmaker and stamina.   Short: Start walking at  least one extra day a week at home.  Long: Continue to exercise independently  Short: Continue to increase treadmill.  Long: Continue to increase strength and stamina.   Short: Add incline to treadmill.  Long: Continue to walk on off days.    [place] Name 05/05/18 1647 05/13/18 1016 05/20/18 1034 06/03/18 1000 06/17/18 1612     Exercise Goal Re-Evaluation   Exercise Goals Review  Increase Physical Activity;Increase Strength and Stamina;Understanding of Exercise Prescription  Increase Physical Activity;Increase Strength and Stamina;Understanding of Exercise Prescription  Increase Physical Activity;Increase Strength and Stamina;Able to understand and use rate of perceived exertion (RPE) scale;Knowledge and understanding of Target Heart Rate Range (THRR);Able to check pulse independently;Understanding of Exercise Prescription  Increase Physical Activity;Increase Strength and Stamina;Understanding of Exercise Prescription  Increase Physical Activity;Increase Strength and Stamina;Understanding of Exercise Prescription   Comments  Laurice has been doing well in rehab.  He was out of town last week.  He still needs to add incline to his treadmill as he has only attended once since last review.  We will continue to monitor his progress.   Keric has been doing well in rehab.  He made up a few classes in the afternoon.  He has not started to add in exercise as he has been busy.  He does play golf on Wednesday so is staying active.  He does have a treadmill at home but it is in the attic. He is going to work on getting it down from there.Tera Mater does want to try the Elliptical for something new. He is going to join [employer]. He will ask someone to help him get his treadmill down.   Christepher has been doing well in rehab.  He has not gotten on the elliptical yet, but still would like to try.  We will encourage him to switch it in to his rotation on his next visit.  We will continue to monitor his progression.   Kaide  continues to do well in rehab.  He still has not tried the elliptical.  We will continue to encourage him to try.  He is up to level 5 on the NuStep.  We will continue to monitor his progress.    Expected Outcomes  Short: Add incline to treadmill.  Long: Continue to walk on  off days.   Short: Get treadmill out of attic.  Long: Add in home exercise at least one extra day a week.   Short: get treadmill out of attic, join [facility], and [facility]. Long: Commit to working out outside of Cardiac Rehab.  Short: Try the elliptical.  Long: Continue to exercise more on off days, besides just playing a round of golf.   Short: Try ellipitcal.  Long: Continue to exercie more   Row Name 07/02/18 1341             Exercise Goal Re-Evaluation   Exercise Goals Review  Increase Physical Activity;Increase Strength and Stamina;Understanding of Exercise Prescription       Comments  Zhane is nearing graduation on 2/7.  We will doing his walk test next week and expect to see a good improvement.  We will continue to monitor his progress.        Expected Outcomes  Short: Improve post 6MWT.  Long: Continue to exercise independently          Nutrition & Weight - Outcomes: Pre Biometrics - 03/10/18 1302      Pre Biometrics   Height  6' 3.5" (1.918 m)    Weight  230 lb 1.6 oz (104.4 kg)    Waist Circumference  40 inches    Hip Circumference  43 inches    Waist to Hip Ratio  0.93 %    BMI (Calculated)  28.37    Single Leg Stand  5.73 seconds      Post Biometrics - 07/08/18 1205       Post  Biometrics   Height  6' 3.5" (1.918 m)    Weight  241 lb (109.3 kg)    Waist Circumference  42 inches    Hip Circumference  43 inches    Waist to Hip Ratio  0.98 %    BMI (Calculated)  29.72    Single Leg Stand  3.48 seconds       Nutrition: Nutrition Therapy & Goals - 03/18/18 1040      Nutrition Therapy   Diet  DM/ TLC   following the Mediterranean diet   Protein (specify units)  12oz     Fiber  35 grams    Whole Grain Foods  3 servings   chooses whole grains such as brown rice and whole grain bread   Saturated Fats  15 max. grams    Fruits and Vegetables  8 servings/day   has increased fruit and vegetable intake over the past month dramaticlly   Sodium  1500 grams      Personal Nutrition Goals   Nutrition Goal  Continue to work on making recent dietary change to the Mediterranean style of eating a lifestyle habit. Great job so far!    Personal Goal #2  Continue to minimize red / processed meat intake, reduce added sugar intake and drink sugar free beverages    Personal Goal #3  When eating out, look for nutritious menu options that align with the Mediterrean style of eating most often    Comments  He and his wife have changed their eating patterns and style since his heart attack 4-5 weeks ago. They have been following a low sodium [place]iterrean diet and have embraced this style of eating well. They have been eating out less, looking for desserts with less added sugar IE yogurt + Fruit & Nuts, eaing less red meat, and eating more fruits/ vegetables/  whole grains. His wife has made the diet switch with him and his helping to prepare meals. He has been choosing eggs with wheat toast for breakfast most days. BG this week pre-prandial has been 100-120. His ultimate goal is to lower his A1C (previously 9.7).      Intervention Plan   Intervention  Prescribe, educate and counsel regarding individualized specific dietary modifications aiming towards targeted core components such as weight, hypertension, lipid management, diabetes, heart failure and other comorbidities.    Expected Outcomes  Short Term Goal: Understand basic principles of dietary content, such as calories, fat, sodium, cholesterol and nutrients.;Short Term Goal: A plan has been developed with personal nutrition goals set during dietitian appointment.;Long Term Goal: Adherence to prescribed nutrition plan.       Nutrition  Discharge: Nutrition Assessments - 07/15/18 1038      MEDFICTS Scores   Pre Score  47    Post Score  39    Score Difference  -8       Education Questionnaire Score: Knowledge Questionnaire Score - 07/15/18 1038      Knowledge Questionnaire Score   Pre Score  23/26    Post Score  25/26   test reviewed with pt today      Goals reviewed with patient; copy given to patient.

## 2018-07-17 DIAGNOSIS — I213 ST elevation (STEMI) myocardial infarction of unspecified site: Secondary | ICD-10-CM

## 2018-07-17 NOTE — Progress Notes (Signed)
Daily Session Note  Patient Details  Name: Brandon Wall MRN: [id number] Date of Birth: 12-Dec-1940 Referring Provider:     Cardiac Rehab from 03/10/2018 in [facility] Cardiac [facility]  Referring Provider  Chanetta Marshall MD      Encounter Date: 07/17/2018  Check In: Session Check In - 07/17/18 1617      Check-In   Supervising physician immediately available to respond to emergencies  See telemetry face sheet for immediately available ER MD    Location  [facility]-Cardiac & Pulmonary Rehab    Staff Present  Justin Mend Lorre Nick, MA, RCEP, CCRP, Exercise Physiologist;Carroll Enterkin, RN, BSN    Medication changes reported      No    Fall or balance concerns reported     No    Warm-up and Cool-down  Performed as group-led instruction    Resistance Training Performed  Yes    VAD Patient?  No    PAD/SET Patient?  No      Pain Assessment   Currently in Pain?  No/denies          Social History   Tobacco Use  Smoking Status Never Smoker  Smokeless Tobacco Never Used    Goals Met:  Independence with exercise equipment Exercise tolerated well No report of cardiac concerns or symptoms Strength training completed today  Goals Unmet:  Not Applicable  Comments: Pt able to follow exercise prescription today without complaint.  Will continue to monitor for progression.    Dr. Emily Filbert is Medical Director for [place] and [name].

## 2018-07-17 NOTE — Progress Notes (Signed)
Discharge Progress Report  Patient Details  Name: Brandon Wall MRN: [id number] Date of Birth: 1941-04-05 Referring Provider:     Cardiac Rehab from 03/10/2018 in [facility] Cardiac and Pulmonary Rehab  Referring Provider  Chanetta Marshall MD       Number of Visits: 36/36  Reason for Discharge:  Patient reached a stable level of exercise. Patient independent in their exercise. Patient has met program and personal goals.  Smoking History:  Social History   Tobacco Use  Smoking Status Never Smoker  Smokeless Tobacco Never Used    Diagnosis:  ST elevation myocardial infarction (STEMI), unspecified artery ([place])  ADL UCSD:   Initial Exercise Prescription: Initial Exercise Prescription - 03/10/18 1200      Date of Initial Exercise RX and Referring Provider   Date  03/10/18    Referring Provider  Chanetta Marshall MD      Treadmill   MPH  2.1    Grade  0.5    Minutes  15    METs  2.75      NuStep   Level  2    SPM  80    Minutes  15    METs  2.5      REL-XR   Level  1    Speed  50    Minutes  15    METs  2.5      Prescription Details   Frequency (times per week)  2    Duration  Progress to 30 minutes of continuous aerobic without signs/symptoms of physical distress      Intensity   THRR 40-80% of Max Heartrate  92-125    Ratings of Perceived Exertion  11-13    Perceived Dyspnea  0-4      Progression   Progression  Continue to progress workloads to maintain intensity without signs/symptoms of physical distress.      Resistance Training   Training Prescription  Yes    Weight  3 lbs    Reps  10-15       Discharge Exercise Prescription (Final Exercise Prescription Changes): Exercise Prescription Changes - 07/02/18 1300      Response to Exercise   Blood Pressure (Admit)  126/70    Blood Pressure (Exercise)  132/74    Blood Pressure (Exit)  98/58    Heart Rate (Admit)  58 bpm    Heart Rate (Exercise)  79 bpm    Heart Rate (Exit)  60 bpm    Rating of  Perceived Exertion (Exercise)  12    Symptoms  none    Duration  Continue with 30 min of aerobic exercise without signs/symptoms of physical distress.    Intensity  THRR unchanged      Progression   Progression  Continue to progress workloads to maintain intensity without signs/symptoms of physical distress.    Average METs  2.83      Resistance Training   Training Prescription  Yes    Weight  4 lbs    Reps  10-15      Interval Training   Interval Training  No      Treadmill   MPH  2.5    Grade  0    Minutes  15    METs  2.91      NuStep   Level  5    Minutes  15    METs  3.1      REL-XR   Level  6    Minutes  15    METs  2.5      Home Exercise Plan   Plans to continue exercise at  Home (comment)   walking and treadmill   Frequency  Add 3 additional days to program exercise sessions.    Initial Home Exercises Provided  03/27/18       Functional Capacity: 6 Minute Walk    Row Name 03/10/18 1250 07/08/18 1204       6 Minute Walk   Phase  Initial  Discharge    Distance  1468 feet  1546 feet    Distance % Change  -  5.3 %    Distance Feet Change  -  78 ft    Walk Time  6 minutes  6 minutes    # of Rest Breaks  0  0    MPH  2.78  2.92    METS  2.64  2.62    RPE  12  13    VO2 Peak  9.26  9.19    Symptoms  No  No    Resting HR  58 bpm  84 bpm    Resting BP  126/64  120/78    Resting Oxygen Saturation   98 %  97 %    Exercise Oxygen Saturation  during 6 min walk  98 %  97 %    Max Ex. HR  81 bpm  100 bpm    Max Ex. BP  134/68  140/68    2 Minute Post BP  112/66  -       Psychological, QOL, Others - Outcomes: PHQ 2/9: Depression screen [facility] 2/9 07/15/2018 03/10/2018 03/02/2015 11/29/2014  Decreased Interest 0 0 0 0  Down, Depressed, Hopeless 0 0 0 0  PHQ - 2 Score 0 0 0 0  Altered sleeping 0 0 1 -  Tired, decreased energy '1 3 1 ' -  Change in appetite 0 0 0 -  Feeling bad or failure about yourself  0 0 0 -  Trouble concentrating 0 0 0 -  Moving slowly or  fidgety/restless 0 0 0 -  Suicidal thoughts 0 0 0 -  PHQ-9 Score '1 3 2 ' -  Difficult doing work/chores Not difficult at all Not difficult at all Not difficult at all -    Quality of Life: Quality of Life - 07/15/18 1039      Quality of Life Scores   Health/Function Pre  22.8 %    Health/Function Post  25.2 %    Health/Function % Change  10.53 %    Socioeconomic Pre  26.14 %    Socioeconomic Post  26.57 %    Socioeconomic % Change   1.64 %    Psych/Spiritual Pre  27.43 %    Psych/Spiritual Post  28.29 %    Psych/Spiritual % Change  3.14 %    Family Pre  25.2 %    Family Post  27.6 %    Family % Change  9.52 %    GLOBAL Pre  24.79 %    GLOBAL Post  26.47 %    GLOBAL % Change  6.78 %       Personal Goals: Goals established at orientation with interventions provided to work toward goal. Personal Goals and Risk Factors at Admission - 03/10/18 1253      Core Components/Risk Factors/Patient Goals on Admission    Weight Management  Yes;Weight Loss;Obesity    Intervention  Weight Management: Develop a combined nutrition and exercise  program designed to reach desired caloric intake, while maintaining appropriate intake of nutrient and fiber, sodium and fats, and appropriate energy expenditure required for the weight goal.;Weight Management: Provide education and appropriate resources to help participant work on and attain dietary goals.;Obesity: Provide education and appropriate resources to help participant work on and attain dietary goals.;Weight Management/Obesity: Establish reasonable short term and long term weight goals.    Admit Weight  230 lb (104.3 kg)    Goal Weight: Short Term  225 lb (102.1 kg)    Goal Weight: Long Term  210 lb (95.3 kg)    Expected Outcomes  Short Term: Continue to assess and modify interventions until short term weight is achieved;Long Term: Adherence to nutrition and physical activity/exercise program aimed toward attainment of established weight  goal;Understanding recommendations for meals to include 15-35% energy as protein, 25-35% energy from fat, 35-60% energy from carbohydrates, less than 213m of dietary cholesterol, 20-35 gm of total fiber daily;Understanding of distribution of calorie intake throughout the day with the consumption of 4-5 meals/snacks;Weight Loss: Understanding of general recommendations for a balanced deficit meal plan, which promotes 1-2 lb weight loss per week and includes a negative energy balance of [phone] kcal/d    Diabetes  Yes    Intervention  Provide education about signs/symptoms and action to take for hypo/hyperglycemia.;Provide education about proper nutrition, including hydration, and aerobic/resistive exercise prescription along with prescribed medications to achieve blood glucose in normal ranges: Fasting glucose 65-99 mg/dL    Expected Outcomes  Short Term: Participant verbalizes understanding of the signs/symptoms and immediate care of hyper/hypoglycemia, proper foot care and importance of medication, aerobic/resistive exercise and nutrition plan for blood glucose control.;Long Term: Attainment of HbA1C < 7%.    Hypertension  Yes    Intervention  Provide education on lifestyle modifcations including regular physical activity/exercise, weight management, moderate sodium restriction and increased consumption of fresh fruit, vegetables, and low fat dairy, alcohol moderation, and smoking cessation.;Monitor prescription use compliance.    Expected Outcomes  Short Term: Continued assessment and intervention until BP is < 140/963mHG in hypertensive participants. < 130/8069mG in hypertensive participants with diabetes, heart failure or chronic kidney disease.;Long Term: Maintenance of blood pressure at goal levels.    Lipids  Yes    Intervention  Provide education and support for participant on nutrition & aerobic/resistive exercise along with prescribed medications to achieve LDL <68m92mDL >40mg56m Expected  Outcomes  Short Term: Participant states understanding of desired cholesterol values and is compliant with medications prescribed. Participant is following exercise prescription and nutrition guidelines.;Long Term: Cholesterol controlled with medications as prescribed, with individualized exercise RX and with personalized nutrition plan. Value goals: LDL < 68mg,58m > 40 mg.        Personal Goals Discharge: Goals and Risk Factor Review    Row Name 03/25/18 1037 05/13/18 1023 05/20/18 1042 06/19/18 1044       Core Components/Risk Factors/Patient Goals Review   Personal Goals Review  Weight Management/Obesity;Hypertension;Diabetes;Lipids  Weight Management/Obesity;Hypertension;Diabetes;Lipids  Diabetes;Hypertension;Lipids;Weight Management/Obesity  Weight Management/Obesity;Diabetes;Hypertension;Lipids    Review  HarrelOllief to a good start in rehab.  He is working to get his weight down to 210lbs at home.  Currently he is averaging around 215lbs.  He is doing well with his blood sugars at usually around 100-120s in the morning.  He checks them every morning and usualy once in the afternoon or evening as well.   His has does not check his blood pressures  at home as he does not have a cuff, but we talked about getting a new one to check at home.  He is doing well with his medications and has no problems.   Kore has been maintaining his weight for the most part. He is up a little today after Thanksgiving.  He is around 225 lbs at home.  He is sure that it will come back down.  He continues to check his sugars regularly at home.  His A1c is down to 7.1 so he has better control.   He is still not checking his pressures at home and has not gotten a new cuff.  He feels comfortable with where his pressures are here and in the office.  He is still doing well with his medicaitons.   Kayon is doing a Mediterranean nutrition plan with his wife. He checks blood sugar every morning. He does not have a BP  machine. I recommended that he buy one to have at home to check his BP and also his wife's bp if ever needed. I  Tera Mater and his wife still follow Med diet.  He has not purchased a home BP monitor yet.  FBG have been 104, 123 past 2 days.  He reports taking all meds as directed.  [facility]    Expected Outcomes  Short: Get new blood pressure cuff to check at home.  Long: Continue to work on weight loss.   Short: Continue to check sugars regularly and get weight back down.  Long: Continue to work on weight loss.   Short: Continue to check sugars, eat healthy, and buy a bp machine which we will check for accuracy for him. Long: Continue to work on weight loss and take medications as prescribed.  Short - complete HT Long - maintain exercise ond diet on his own       Exercise Goals and Review: Exercise Goals    Row Name 03/10/18 1301             Exercise Goals   Increase Physical Activity  Yes       Intervention  Provide advice, education, support and counseling about physical activity/exercise needs.;Develop an individualized exercise prescription for aerobic and resistive training based on initial evaluation findings, risk stratification, comorbidities and participant's personal goals.       Expected Outcomes  Short Term: Attend rehab on a regular basis to increase amount of physical activity.;Long Term: Exercising regularly at least 3-5 days a week.;Long Term: Add in home exercise to make exercise part of routine and to increase amount of physical activity.       Increase Strength and Stamina  Yes       Intervention  Provide advice, education, support and counseling about physical activity/exercise needs.;Develop an individualized exercise prescription for aerobic and resistive training based on initial evaluation findings, risk stratification, comorbidities and participant's personal goals.       Expected Outcomes  Short Term: Increase workloads from  initial exercise prescription for resistance, speed, and METs.;Short Term: Perform resistance training exercises routinely during rehab and add in resistance training at home;Long Term: Improve cardiorespiratory fitness, muscular endurance and strength as measured by increased METs and functional capacity (6MWT)       Able to understand and use rate of perceived exertion (RPE) scale  Yes       Intervention  Provide education and explanation on how to use RPE scale  Expected Outcomes  Short Term: Able to use RPE daily in rehab to express subjective intensity level;Long Term:  Able to use RPE to guide intensity level when exercising independently       Able to understand and use Dyspnea scale  Yes       Intervention  Provide education and explanation on how to use Dyspnea scale       Expected Outcomes  Short Term: Able to use Dyspnea scale daily in rehab to express subjective sense of shortness of breath during exertion;Long Term: Able to use Dyspnea scale to guide intensity level when exercising independently       Knowledge and understanding of Target Heart Rate Range (THRR)  Yes       Intervention  Provide education and explanation of THRR including how the numbers were predicted and where they are located for reference       Expected Outcomes  Short Term: Able to state/look up THRR;Short Term: Able to use daily as guideline for intensity in rehab;Long Term: Able to use THRR to govern intensity when exercising independently       Able to check pulse independently  Yes       Intervention  Provide education and demonstration on how to check pulse in carotid and radial arteries.;Review the importance of being able to check your own pulse for safety during independent exercise       Expected Outcomes  Short Term: Able to explain why pulse checking is important during independent exercise;Long Term: Able to check pulse independently and accurately       Understanding of Exercise Prescription  Yes        Intervention  Provide education, explanation, and written materials on patient's individual exercise prescription       Expected Outcomes  Short Term: Able to explain program exercise prescription;Long Term: Able to explain home exercise prescription to exercise independently          Exercise Goals Re-Evaluation: Exercise Goals Re-Evaluation    Row Name 03/18/18 0929 03/25/18 1134 03/27/18 1003 04/08/18 1458 04/22/18 1624     Exercise Goal Re-Evaluation   Exercise Goals Review  Increase Physical Activity;Increase Strength and Stamina;Able to understand and use rate of perceived exertion (RPE) scale;Knowledge and understanding of Target Heart Rate Range (THRR);Understanding of Exercise Prescription  Increase Physical Activity;Increase Strength and Stamina;Understanding of Exercise Prescription  Increase Physical Activity;Increase Strength and Stamina;Understanding of Exercise Prescription;Able to understand and use rate of perceived exertion (RPE) scale;Knowledge and understanding of Target Heart Rate Range (THRR);Able to check pulse independently  Increase Physical Activity;Increase Strength and Stamina;Able to check pulse independently  Increase Physical Activity;Increase Strength and Stamina;Able to check pulse independently   Comments  Reviewed RPE scale, THR and program prescription with pt today.  Pt voiced understanding and was given a copy of goals to take home.   Fabrizio is off to a good start in rehab again.  He has been playing golf some on his off days, but we will need to talk about adding in home exercise.  He is up to 2.7 METs on the NuStep.  We will start to increase his workloads and monitor his progression.   Reviewed home exercise with pt today.  Pt plans to walk and use treadmill at home for exercise.  Reviewed THR, pulse, RPE, sign and symptoms, and when to call 911 or MD.  Also discussed weather considerations and indoor options.  Pt voiced understanding.  Laird continues to do  well in rehab.  He is now up to level 5 on the XR and level 4 on the NuStep.  We will continue to monitor his progres.   Shana has been doing well in rehab.  He will need to add some incline to his treadmill.  We will continue to monitor his progression.   Expected Outcomes  Short: Use RPE daily to regulate intensity. Long: Follow program prescription in THR.  Short: Review home exercise guidelines.   Long: Continue to work on Printmaker and stamina.   Short: Start walking at least one extra day a week at home.  Long: Continue to exercise independently  Short: Continue to increase treadmill.  Long: Continue to increase strength and stamina.   Short: Add incline to treadmill.  Long: Continue to walk on off days.    [place] Name 05/05/18 1647 05/13/18 1016 05/20/18 1034 06/03/18 1000 06/17/18 1612     Exercise Goal Re-Evaluation   Exercise Goals Review  Increase Physical Activity;Increase Strength and Stamina;Understanding of Exercise Prescription  Increase Physical Activity;Increase Strength and Stamina;Understanding of Exercise Prescription  Increase Physical Activity;Increase Strength and Stamina;Able to understand and use rate of perceived exertion (RPE) scale;Knowledge and understanding of Target Heart Rate Range (THRR);Able to check pulse independently;Understanding of Exercise Prescription  Increase Physical Activity;Increase Strength and Stamina;Understanding of Exercise Prescription  Increase Physical Activity;Increase Strength and Stamina;Understanding of Exercise Prescription   Comments  Khyle has been doing well in rehab.  He was out of town last week.  He still needs to add incline to his treadmill as he has only attended once since last review.  We will continue to monitor his progress.   Denis has been doing well in rehab.  He made up a few classes in the afternoon.  He has not started to add in exercise as he has been busy.  He does play golf on Wednesday so is staying active.  He does  have a treadmill at home but it is in the attic. He is going to work on getting it down from there.Tera Mater does want to try the Elliptical for something new. He is going to join [employer]. He will ask someone to help him get his treadmill down.   Joram has been doing well in rehab.  He has not gotten on the elliptical yet, but still would like to try.  We will encourage him to switch it in to his rotation on his next visit.  We will continue to monitor his progression.   Easton continues to do well in rehab.  He still has not tried the elliptical.  We will continue to encourage him to try.  He is up to level 5 on the NuStep.  We will continue to monitor his progress.    Expected Outcomes  Short: Add incline to treadmill.  Long: Continue to walk on off days.   Short: Get treadmill out of attic.  Long: Add in home exercise at least one extra day a week.   Short: get treadmill out of attic, join the [place], and [facility]. Long: Commit to working out outside of Cardiac Rehab.  Short: Try the elliptical.  Long: Continue to exercise more on off days, besides just playing a round of golf.   Short: Try ellipitcal.  Long: Continue to exercie more   Row Name 07/02/18 1341             Exercise Goal Re-Evaluation   Exercise Goals Review  Increase Physical Activity;Increase Strength and Stamina;Understanding of Exercise Prescription       Comments  Nicklas is nearing graduation on 2/7.  We will doing his walk test next week and expect to see a good improvement.  We will continue to monitor his progress.        Expected Outcomes  Short: Improve post 6MWT.  Long: Continue to exercise independently          Nutrition & Weight - Outcomes: Pre Biometrics - 03/10/18 1302      Pre Biometrics   Height  6' 3.5" (1.918 m)    Weight  230 lb 1.6 oz (104.4 kg)    Waist Circumference  40 inches    Hip Circumference  43 inches    Waist to Hip Ratio  0.93 %    BMI (Calculated)  28.37    Single  Leg Stand  5.73 seconds      Post Biometrics - 07/08/18 1205       Post  Biometrics   Height  6' 3.5" (1.918 m)    Weight  241 lb (109.3 kg)    Waist Circumference  42 inches    Hip Circumference  43 inches    Waist to Hip Ratio  0.98 %    BMI (Calculated)  29.72    Single Leg Stand  3.48 seconds       Nutrition: Nutrition Therapy & Goals - 03/18/18 1040      Nutrition Therapy   Diet  DM/ TLC   following the Mediterranean diet   Protein (specify units)  12oz    Fiber  35 grams    Whole Grain Foods  3 servings   chooses whole grains such as brown rice and whole grain bread   Saturated Fats  15 max. grams    Fruits and Vegetables  8 servings/day   has increased fruit and vegetable intake over the past month dramaticlly   Sodium  1500 grams      Personal Nutrition Goals   Nutrition Goal  Continue to work on making recent dietary change to the Mediterranean style of eating a lifestyle habit. Great job so far!    Personal Goal #2  Continue to minimize red / processed meat intake, reduce added sugar intake and drink sugar free beverages    Personal Goal #3  When eating out, look for nutritious menu options that align with the Mediterrean style of eating most often    Comments  He and his wife have changed their eating patterns and style since his heart attack 4-5 weeks ago. They have been following a low sodium Mediterrean diet and have embraced this style of eating well. They have been eating out less, looking for desserts with less added sugar IE yogurt + Fruit & Nuts, eaing less red meat, and eating more fruits/ vegetables/ whole grains. His wife has made the diet switch with him and his helping to prepare meals. He has been choosing eggs with wheat toast for breakfast most days. BG this week pre-prandial has been 100-120. His ultimate goal is to lower his A1C (previously 9.7).      Intervention Plan   Intervention  Prescribe, educate and counsel regarding individualized specific  dietary modifications aiming towards targeted core components such as weight, hypertension, lipid management, diabetes, heart failure and other comorbidities.    Expected Outcomes  Short Term Goal: Understand basic principles of dietary content, such as calories, fat, sodium, cholesterol and nutrients.;Short Term Goal: A plan  has been developed with personal nutrition goals set during dietitian appointment.;Long Term Goal: Adherence to prescribed nutrition plan.       Nutrition Discharge: Nutrition Assessments - 07/15/18 1038      MEDFICTS Scores   Pre Score  47    Post Score  39    Score Difference  -8       Education Questionnaire Score: Knowledge Questionnaire Score - 07/15/18 1038      Knowledge Questionnaire Score   Pre Score  23/26    Post Score  25/26   test reviewed with pt today      Goals reviewed with patient; copy given to patient.

## 2018-07-17 NOTE — Progress Notes (Signed)
Cardiac Individual Treatment Plan  Patient Details  Name: Brandon Wall MRN: [id number] Date of Birth: Nov 22, 1940 Referring Provider:     Cardiac Rehab from 03/10/2018 in [facility] Cardiac and Pulmonary Rehab  Referring Provider  Chanetta Marshall MD      Initial Encounter Date:    Cardiac Rehab from 03/10/2018 in [facility] Cardiac and Pulmonary Rehab  Date  03/10/18      Visit Diagnosis: ST elevation myocardial infarction (STEMI), unspecified artery ([place])  Patient's Home Medications on Admission:  Current Outpatient Medications:  .  apixaban (ELIQUIS) 5 MG TABS tablet, TAKE 1 TABLET BY MOUTH TWICE DAILY, Disp: , Rfl:  .  aspirin 81 MG chewable tablet, Chew by mouth., Disp: , Rfl:  .  clopidogrel (PLAVIX) 75 MG tablet, , Disp: , Rfl: 0 .  glipiZIDE (GLUCOTROL) 5 MG tablet, Take by mouth., Disp: , Rfl:  .  glucose blood test strip, , Disp: , Rfl:  .  isosorbide mononitrate (IMDUR) 30 MG 24 hr tablet, Take by mouth., Disp: , Rfl:  .  JARDIANCE 25 MG TABS tablet, , Disp: , Rfl:  .  ketoconazole (NIZORAL) 2 % cream, , Disp: , Rfl:  .  losartan (COZAAR) 50 MG tablet, 50 mg daily. , Disp: , Rfl:  .  metFORMIN (GLUCOPHAGE) 1000 MG tablet, Take 2,000 mg by mouth 2 (two) times daily with a meal. , Disp: , Rfl:  .  metoprolol succinate (TOPROL-XL) 100 MG 24 hr tablet, Take 50 mg by mouth. , Disp: , Rfl:  .  nitroGLYCERIN (NITROSTAT) 0.4 MG SL tablet, Place under the tongue., Disp: , Rfl:  .  pantoprazole (PROTONIX) 40 MG tablet, Take by mouth., Disp: , Rfl:  .  rosuvastatin (CRESTOR) 40 MG tablet, Take by mouth., Disp: , Rfl:  .  sotalol (BETAPACE) 80 MG tablet, TK 1 T PO BID, Disp: , Rfl: 2 .  spironolactone (ALDACTONE) 25 MG tablet, TK SS T PO ONCE D, Disp: , Rfl: 10  Past Medical History: No past medical history on file.  Tobacco Use: Social History   Tobacco Use  Smoking Status Never Smoker  Smokeless Tobacco Never Used    Labs: Recent Review Flowsheet Data    There is no flowsheet  data to display.       Exercise Target Goals: Exercise Program Goal: Individual exercise prescription set using results from initial 6 min walk test and THRR while considering  patient's activity barriers and safety.   Exercise Prescription Goal: Initial exercise prescription builds to 30-45 minutes a day of aerobic activity, 2-3 days per week.  Home exercise guidelines will be given to patient during program as part of exercise prescription that the participant will acknowledge.  Activity Barriers & Risk Stratification: Activity Barriers & Cardiac Risk Stratification - 03/10/18 1251      Activity Barriers & Cardiac Risk Stratification   Activity Barriers  Arthritis;Deconditioning;Muscular Weakness;Balance Concerns;Decreased Ventricular Function    Cardiac Risk Stratification  High       6 Minute Walk: 6 Minute Walk    Row Name 03/10/18 1250 07/08/18 1204       6 Minute Walk   Phase  Initial  Discharge    Distance  1468 feet  1546 feet    Distance % Change  -  5.3 %    Distance Feet Change  -  78 ft    Walk Time  6 minutes  6 minutes    # of Rest Breaks  0  0  MPH  2.78  2.92    METS  2.64  2.62    RPE  12  13    VO2 Peak  9.26  9.19    Symptoms  No  No    Resting HR  58 bpm  84 bpm    Resting BP  126/64  120/78    Resting Oxygen Saturation   98 %  97 %    Exercise Oxygen Saturation  during 6 min walk  98 %  97 %    Max Ex. HR  81 bpm  100 bpm    Max Ex. BP  134/68  140/68    2 Minute Post BP  112/66  -       Oxygen Initial Assessment:   Oxygen Re-Evaluation:   Oxygen Discharge (Final Oxygen Re-Evaluation):   Initial Exercise Prescription: Initial Exercise Prescription - 03/10/18 1200      Date of Initial Exercise RX and Referring Provider   Date  03/10/18    Referring Provider  Chanetta Marshall MD      Treadmill   MPH  2.1    Grade  0.5    Minutes  15    METs  2.75      NuStep   Level  2    SPM  80    Minutes  15    METs  2.5      REL-XR    Level  1    Speed  50    Minutes  15    METs  2.5      Prescription Details   Frequency (times per week)  2    Duration  Progress to 30 minutes of continuous aerobic without signs/symptoms of physical distress      Intensity   THRR 40-80% of Max Heartrate  92-125    Ratings of Perceived Exertion  11-13    Perceived Dyspnea  0-4      Progression   Progression  Continue to progress workloads to maintain intensity without signs/symptoms of physical distress.      Resistance Training   Training Prescription  Yes    Weight  3 lbs    Reps  10-15       Perform Capillary Blood Glucose checks as needed.  Exercise Prescription Changes: Exercise Prescription Changes    Row Name 03/10/18 1200 03/25/18 1100 03/27/18 1000 04/22/18 1600 05/05/18 1600     Response to Exercise   Blood Pressure (Admit)  126/64  118/64  -  144/76  108/66   Blood Pressure (Exercise)  134/68  138/62  -  140/62  142/66   Blood Pressure (Exit)  112/66  110/62  -  120/64  108/66   Heart Rate (Admit)  58 bpm  57 bpm  -  62 bpm  67 bpm   Heart Rate (Exercise)  81 bpm  118 bpm  -  76 bpm  97 bpm   Heart Rate (Exit)  50 bpm  67 bpm  -  64 bpm  66 bpm   Oxygen Saturation (Admit)  98 %  -  -  -  -   Oxygen Saturation (Exercise)  98 %  -  -  -  -   Rating of Perceived Exertion (Exercise)  12  13  -  12  12   Symptoms  none  none  -  none  none   Comments  walk test results  -  -  -  -  Duration  -  Continue with 30 min of aerobic exercise without signs/symptoms of physical distress.  -  Continue with 30 min of aerobic exercise without signs/symptoms of physical distress.  Continue with 30 min of aerobic exercise without signs/symptoms of physical distress.   Intensity  -  THRR unchanged  -  THRR unchanged  THRR unchanged     Progression   Progression  -  Continue to progress workloads to maintain intensity without signs/symptoms of physical distress.  -  Continue to progress workloads to maintain intensity without  signs/symptoms of physical distress.  Continue to progress workloads to maintain intensity without signs/symptoms of physical distress.   Average METs  -  2.65  -  2.87  3.21     Resistance Training   Training Prescription  -  Yes  -  Yes  Yes   Weight  -  3 lbs  -  3 lbs  3 lbs   Reps  -  10-15  -  10-15  10-15     Interval Training   Interval Training  -  No  -  No  No     Treadmill   MPH  -  2.1  -  2.5  2.5   Grade  -  0.5  -  0  0   Minutes  -  15  -  15  15   METs  -  2.75  -  2.91  2.91     NuStep   Level  -  2  -  4  -   Minutes  -  15  -  15  -   METs  -  2.7  -  3  -     REL-XR   Level  -  1  -  5  5   Minutes  -  15  -  15  15   METs  -  2.5  -  2.7  3.5     Home Exercise Plan   Plans to continue exercise at  -  -  Home (comment) walking and treadmill  Home (comment) walking and treadmill  Home (comment) walking and treadmill   Frequency  -  -  Add 3 additional days to program exercise sessions.  Add 3 additional days to program exercise sessions.  Add 3 additional days to program exercise sessions.   Initial Home Exercises Provided  -  -  03/27/18  03/27/18  03/27/18   Row Name 05/21/18 1400 06/03/18 1000 06/17/18 1600 07/02/18 1300       Response to Exercise   Blood Pressure (Admit)  120/76  120/64  112/68  126/70    Blood Pressure (Exercise)  140/62  140/60  118/60  132/74    Blood Pressure (Exit)  96/60  128/70  134/64  98/58    Heart Rate (Admit)  60 bpm  60 bpm  62 bpm  58 bpm    Heart Rate (Exercise)  93 bpm  113 bpm  116 bpm  79 bpm    Heart Rate (Exit)  64 bpm  59 bpm  58 bpm  60 bpm    Rating of Perceived Exertion (Exercise)  '13  12  12  12    ' Symptoms  none  none  none  none    Duration  Continue with 30 min of aerobic exercise without signs/symptoms of physical distress.  Continue with 30 min of aerobic exercise without signs/symptoms of physical distress.  Continue with 30 min of aerobic exercise without signs/symptoms of physical distress.  Continue  with 30 min of aerobic exercise without signs/symptoms of physical distress.    Intensity  THRR unchanged  THRR unchanged  THRR unchanged  THRR unchanged      Progression   Progression  Continue to progress workloads to maintain intensity without signs/symptoms of physical distress.  Continue to progress workloads to maintain intensity without signs/symptoms of physical distress.  Continue to progress workloads to maintain intensity without signs/symptoms of physical distress.  Continue to progress workloads to maintain intensity without signs/symptoms of physical distress.    Average METs  3.3  3.03  3.8  2.83      Resistance Training   Training Prescription  Yes  Yes  Yes  Yes    Weight  4 lbs  4 lbs  4 lbs  4 lbs    Reps  10-15  10-15  10-15  10-15      Interval Training   Interval Training  No  No  No  No      Treadmill   MPH  2.5  2.5  2.5  2.5    Grade  0  0  0  0    Minutes  '15  15  15  15    ' METs  2.91  2.91  2.91  2.91      NuStep   Level  '4  4  5  5    ' Minutes  '15  15  15  15    ' METs  2.8  3.9  3.8  3.1      REL-XR   Level  '5  5  6  6    ' Minutes  '15  15  15  15    ' METs  4.2  2.3  4.7  2.5      Home Exercise Plan   Plans to continue exercise at  Home (comment) walking and treadmill  Home (comment) walking and treadmill  Home (comment) walking and treadmill  Home (comment) walking and treadmill    Frequency  Add 3 additional days to program exercise sessions.  Add 3 additional days to program exercise sessions.  Add 3 additional days to program exercise sessions.  Add 3 additional days to program exercise sessions.    Initial Home Exercises Provided  03/27/18  03/27/18  03/27/18  03/27/18       Exercise Comments: Exercise Comments    Row Name 03/18/18 6190           Exercise Comments  First full day of exercise!  Patient was oriented to gym and equipment including functions, settings, policies, and procedures.  Patient's individual exercise prescription and  treatment plan were reviewed.  All starting workloads were established based on the results of the 6 minute walk test done at initial orientation visit.  The plan for exercise progression was also introduced and progression will be customized based on patient's performance and goals.          Exercise Goals and Review: Exercise Goals    Row Name 03/10/18 1301             Exercise Goals   Increase Physical Activity  Yes       Intervention  Provide advice, education, support and counseling about physical activity/exercise needs.;Develop an individualized exercise prescription for aerobic and resistive training based on initial evaluation findings, risk stratification, comorbidities and participant's personal goals.  Expected Outcomes  Short Term: Attend rehab on a regular basis to increase amount of physical activity.;Long Term: Exercising regularly at least 3-5 days a week.;Long Term: Add in home exercise to make exercise part of routine and to increase amount of physical activity.       Increase Strength and Stamina  Yes       Intervention  Provide advice, education, support and counseling about physical activity/exercise needs.;Develop an individualized exercise prescription for aerobic and resistive training based on initial evaluation findings, risk stratification, comorbidities and participant's personal goals.       Expected Outcomes  Short Term: Increase workloads from initial exercise prescription for resistance, speed, and METs.;Short Term: Perform resistance training exercises routinely during rehab and add in resistance training at home;Long Term: Improve cardiorespiratory fitness, muscular endurance and strength as measured by increased METs and functional capacity (6MWT)       Able to understand and use rate of perceived exertion (RPE) scale  Yes       Intervention  Provide education and explanation on how to use RPE scale       Expected Outcomes  Short Term: Able to use RPE  daily in rehab to express subjective intensity level;Long Term:  Able to use RPE to guide intensity level when exercising independently       Able to understand and use Dyspnea scale  Yes       Intervention  Provide education and explanation on how to use Dyspnea scale       Expected Outcomes  Short Term: Able to use Dyspnea scale daily in rehab to express subjective sense of shortness of breath during exertion;Long Term: Able to use Dyspnea scale to guide intensity level when exercising independently       Knowledge and understanding of Target Heart Rate Range (THRR)  Yes       Intervention  Provide education and explanation of THRR including how the numbers were predicted and where they are located for reference       Expected Outcomes  Short Term: Able to state/look up THRR;Short Term: Able to use daily as guideline for intensity in rehab;Long Term: Able to use THRR to govern intensity when exercising independently       Able to check pulse independently  Yes       Intervention  Provide education and demonstration on how to check pulse in carotid and radial arteries.;Review the importance of being able to check your own pulse for safety during independent exercise       Expected Outcomes  Short Term: Able to explain why pulse checking is important during independent exercise;Long Term: Able to check pulse independently and accurately       Understanding of Exercise Prescription  Yes       Intervention  Provide education, explanation, and written materials on patient's individual exercise prescription       Expected Outcomes  Short Term: Able to explain program exercise prescription;Long Term: Able to explain home exercise prescription to exercise independently          Exercise Goals Re-Evaluation : Exercise Goals Re-Evaluation    Row Name 03/18/18 0929 03/25/18 1134 03/27/18 1003 04/08/18 1458 04/22/18 1624     Exercise Goal Re-Evaluation   Exercise Goals Review  Increase Physical  Activity;Increase Strength and Stamina;Able to understand and use rate of perceived exertion (RPE) scale;Knowledge and understanding of Target Heart Rate Range (THRR);Understanding of Exercise Prescription  Increase Physical Activity;Increase Strength and Stamina;Understanding of Exercise Prescription  Increase Physical Activity;Increase Strength and Stamina;Understanding of Exercise Prescription;Able to understand and use rate of perceived exertion (RPE) scale;Knowledge and understanding of Target Heart Rate Range (THRR);Able to check pulse independently  Increase Physical Activity;Increase Strength and Stamina;Able to check pulse independently  Increase Physical Activity;Increase Strength and Stamina;Able to check pulse independently   Comments  Reviewed RPE scale, THR and program prescription with pt today.  Pt voiced understanding and was given a copy of goals to take home.   Nakul is off to a good start in rehab again.  He has been playing golf some on his off days, but we will need to talk about adding in home exercise.  He is up to 2.7 METs on the NuStep.  We will start to increase his workloads and monitor his progression.   Reviewed home exercise with pt today.  Pt plans to walk and use treadmill at home for exercise.  Reviewed THR, pulse, RPE, sign and symptoms, and when to call 911 or MD.  Also discussed weather considerations and indoor options.  Pt voiced understanding.  Maximos continues to do well in rehab.  He is now up to level 5 on the XR and level 4 on the NuStep.  We will continue to monitor his progres.   Derk has been doing well in rehab.  He will need to add some incline to his treadmill.  We will continue to monitor his progression.   Expected Outcomes  Short: Use RPE daily to regulate intensity. Long: Follow program prescription in THR.  Short: Review home exercise guidelines.   Long: Continue to work on Printmaker and stamina.   Short: Start walking at least one extra day a  week at home.  Long: Continue to exercise independently  Short: Continue to increase treadmill.  Long: Continue to increase strength and stamina.   Short: Add incline to treadmill.  Long: Continue to walk on off days.    [place] Name 05/05/18 1647 05/13/18 1016 05/20/18 1034 06/03/18 1000 06/17/18 1612     Exercise Goal Re-Evaluation   Exercise Goals Review  Increase Physical Activity;Increase Strength and Stamina;Understanding of Exercise Prescription  Increase Physical Activity;Increase Strength and Stamina;Understanding of Exercise Prescription  Increase Physical Activity;Increase Strength and Stamina;Able to understand and use rate of perceived exertion (RPE) scale;Knowledge and understanding of Target Heart Rate Range (THRR);Able to check pulse independently;Understanding of Exercise Prescription  Increase Physical Activity;Increase Strength and Stamina;Understanding of Exercise Prescription  Increase Physical Activity;Increase Strength and Stamina;Understanding of Exercise Prescription   Comments  Ulys has been doing well in rehab.  He was out of town last week.  He still needs to add incline to his treadmill as he has only attended once since last review.  We will continue to monitor his progress.   Jansel has been doing well in rehab.  He made up a few classes in the afternoon.  He has not started to add in exercise as he has been busy.  He does play golf on Wednesday so is staying active.  He does have a treadmill at home but it is in the attic. He is going to work on getting it down from there.Tera Mater does want to try the Elliptical for something new. He is going to join [employer]. He will ask someone to help him get his treadmill down.   Laird has been doing well in rehab.  He has not gotten on the elliptical yet, but still would like to try.  We will  encourage him to switch it in to his rotation on his next visit.  We will continue to monitor his progression.   Rahm continues to do well in  rehab.  He still has not tried the elliptical.  We will continue to encourage him to try.  He is up to level 5 on the NuStep.  We will continue to monitor his progress.    Expected Outcomes  Short: Add incline to treadmill.  Long: Continue to walk on off days.   Short: Get treadmill out of attic.  Long: Add in home exercise at least one extra day a week.   Short: get treadmill out of attic, join the [place], [place] [facility] [place]. Long: Commit to working out outside of Cardiac Rehab.  Short: Try the elliptical.  Long: Continue to exercise more on off days, besides just playing a round of golf.   Short: Try ellipitcal.  Long: Continue to exercie more   Row Name 07/02/18 1341             Exercise Goal Re-Evaluation   Exercise Goals Review  Increase Physical Activity;Increase Strength and Stamina;Understanding of Exercise Prescription       Comments  Vuk is nearing graduation on 2/7.  We will doing his walk test next week and expect to see a good improvement.  We will continue to monitor his progress.        Expected Outcomes  Short: Improve post 6MWT.  Long: Continue to exercise independently          Discharge Exercise Prescription (Final Exercise Prescription Changes): Exercise Prescription Changes - 07/02/18 1300      Response to Exercise   Blood Pressure (Admit)  126/70    Blood Pressure (Exercise)  132/74    Blood Pressure (Exit)  98/58    Heart Rate (Admit)  58 bpm    Heart Rate (Exercise)  79 bpm    Heart Rate (Exit)  60 bpm    Rating of Perceived Exertion (Exercise)  12    Symptoms  none    Duration  Continue with 30 min of aerobic exercise without signs/symptoms of physical distress.    Intensity  THRR unchanged      Progression   Progression  Continue to progress workloads to maintain intensity without signs/symptoms of physical distress.    Average METs  2.83      Resistance Training   Training Prescription  Yes    Weight  4 lbs    Reps  10-15       Interval Training   Interval Training  No      Treadmill   MPH  2.5    Grade  0    Minutes  15    METs  2.91      NuStep   Level  5    Minutes  15    METs  3.1      REL-XR   Level  6    Minutes  15    METs  2.5      Home Exercise Plan   Plans to continue exercise at  Home (comment)   walking and treadmill   Frequency  Add 3 additional days to program exercise sessions.    Initial Home Exercises Provided  03/27/18       Nutrition:  Target Goals: Understanding of nutrition guidelines, daily intake of sodium <1520m, cholesterol <2036m calories 30% from fat and 7% or less from saturated  fats, daily to have 5 or more servings of fruits and vegetables.  Biometrics: Pre Biometrics - 03/10/18 1302      Pre Biometrics   Height  6' 3.5" (1.918 m)    Weight  230 lb 1.6 oz (104.4 kg)    Waist Circumference  40 inches    Hip Circumference  43 inches    Waist to Hip Ratio  0.93 %    BMI (Calculated)  28.37    Single Leg Stand  5.73 seconds      Post Biometrics - 07/08/18 1205       Post  Biometrics   Height  6' 3.5" (1.918 m)    Weight  241 lb (109.3 kg)    Waist Circumference  42 inches    Hip Circumference  43 inches    Waist to Hip Ratio  0.98 %    BMI (Calculated)  29.72    Single Leg Stand  3.48 seconds       Nutrition Therapy Plan and Nutrition Goals: Nutrition Therapy & Goals - 03/18/18 1040      Nutrition Therapy   Diet  DM/ TLC   following the Mediterranean diet   Protein (specify units)  12oz    Fiber  35 grams    Whole Grain Foods  3 servings   chooses whole grains such as brown rice and whole grain bread   Saturated Fats  15 max. grams    Fruits and Vegetables  8 servings/day   has increased fruit and vegetable intake over the past month dramaticlly   Sodium  1500 grams      Personal Nutrition Goals   Nutrition Goal  Continue to work on making recent dietary change to the Mediterranean style of eating a lifestyle habit. Great job so far!     Personal Goal #2  Continue to minimize red / processed meat intake, reduce added sugar intake and drink sugar free beverages    Personal Goal #3  When eating out, look for nutritious menu options that align with the Mediterrean style of eating most often    Comments  He and his wife have changed their eating patterns and style since his heart attack 4-5 weeks ago. They have been following a low sodium Mediterrean diet and have embraced this style of eating well. They have been eating out less, looking for desserts with less added sugar IE yogurt + Fruit & Nuts, eaing less red meat, and eating more fruits/ vegetables/ whole grains. His wife has made the diet switch with him and his helping to prepare meals. He has been choosing eggs with wheat toast for breakfast most days. BG this week pre-prandial has been 100-120. His ultimate goal is to lower his A1C (previously 9.7).      Intervention Plan   Intervention  Prescribe, educate and counsel regarding individualized specific dietary modifications aiming towards targeted core components such as weight, hypertension, lipid management, diabetes, heart failure and other comorbidities.    Expected Outcomes  Short Term Goal: Understand basic principles of dietary content, such as calories, fat, sodium, cholesterol and nutrients.;Short Term Goal: A plan has been developed with personal nutrition goals set during dietitian appointment.;Long Term Goal: Adherence to prescribed nutrition plan.       Nutrition Assessments: Nutrition Assessments - 07/15/18 1038      MEDFICTS Scores   Pre Score  47    Post Score  39    Score Difference  -8  Nutrition Goals Re-Evaluation: Nutrition Goals Re-Evaluation    [place] Name 03/18/18 1057 04/10/18 1018 05/13/18 1107 06/12/18 0933       Goals   Nutrition Goal  Continue to work on making recent dietary change to the [place] style of eating a lifestyle habit. Great job so far!  Continue to work on lifestyle  change of eating a Mediterranean-style diet both at home and when eating out  Continue to work on the lifestyle-change of eating a Scientist, product/process development- style diet both at home and when eating out; Continue to minimize red meat / added sugar intake and to reduce sugar sweetened beverages  Maintain lifestyle change of following a Mediterranean style of eating both at home and when eating out; Continue to reduce intake of read meats, added sugar and sugar sweetened beverages    Comment  Since his heart attack 4-5 weeks ago he has adopted this way of eating as a completely new eating style. So far he feels that he can stick to the Mediterranean diet long-term and is learning to enjoy the foods and cooking at home more often  He and his wife have been eating a Mediterranean-style diet since his heart attack and have been able to stick with it "easily." They are eating more fish and try to pick options when eating out that align with this style of eating as well. For example, his wife purchased a Mediterranean salad dressing that she brings to restaurants.  He reports ability to maintain style of eating, along with his wife. He "fell off" for Thanksgiving but other than that day has easily been able to stick with this lifestyle change  He and his wife have moved into the maintenance stage of stages of change; they have successfully adopted these lifestyle changes    Expected Outcome  He will learn more about the [place] and experiment with recipes along with his wife to keep adherence high.  He will continue to be successful in implementing this change in eating style  Continue into the maintenance stage of the Mediteranean diet lifestyle change  Maintain lifestyle changes of eating a Mediterranean diet and limiting sugar sweetened beverages and other sources of added sugar      Personal Goal #2 Re-Evaluation   Personal Goal #2  Continue to minimize red/ processed meatintake, reduce added sugar intake, and drink  sugar free beverages  -  -  -      Personal Goal #3 Re-Evaluation   Personal Goal #3  When eating out, look for nutritious menu options that align with the Mediterrean style of eating most often  -  -  -       Nutrition Goals Discharge (Final Nutrition Goals Re-Evaluation): Nutrition Goals Re-Evaluation - 06/12/18 0933      Goals   Nutrition Goal  Maintain lifestyle change of following a Mediterranean style of eating both at home and when eating out; Continue to reduce intake of read meats, added sugar and sugar sweetened beverages    Comment  He and his wife have moved into the maintenance stage of stages of change; they have successfully adopted these lifestyle changes    Expected Outcome  Maintain lifestyle changes of eating a Mediterranean diet and limiting sugar sweetened beverages and other sources of added sugar       Psychosocial: Target Goals: Acknowledge presence or absence of significant depression and/or stress, maximize coping skills, provide positive support system. Participant is able to verbalize types and ability to use techniques and  skills needed for reducing stress and depression.   Initial Review & Psychosocial Screening: Initial Psych Review & Screening - 03/10/18 1255      Initial Review   Current issues with  Current Stress Concerns    Source of Stress Concerns  Unable to perform yard/household activities    Comments  Jovoni wants to build back up his stamina so he can feel better daily. He states he sleeps well at night, but is still sleepy sometimes during the day. He is socially active, whether its eating breakfast with one of his sons or friends or playing golf weekly.       Family Dynamics   Good Support System?  Yes   spouse     Barriers   Psychosocial barriers to participate in program  There are no identifiable barriers or psychosocial needs.;The patient should benefit from training in stress management and relaxation.      Screening Interventions    Interventions  Encouraged to exercise;Program counselor consult    Expected Outcomes  Short Term goal: Utilizing psychosocial counselor, staff and physician to assist with identification of specific Stressors or current issues interfering with healing process. Setting desired goal for each stressor or current issue identified.;Long Term Goal: Stressors or current issues are controlled or eliminated.;Short Term goal: Identification and review with participant of any Quality of Life or Depression concerns found by scoring the questionnaire.;Long Term goal: The participant improves quality of Life and PHQ9 Scores as seen by post scores and/or verbalization of changes       Quality of Life Scores:  Quality of Life - 07/15/18 1039      Quality of Life Scores   Health/Function Pre  22.8 %    Health/Function Post  25.2 %    Health/Function % Change  10.53 %    Socioeconomic Pre  26.14 %    Socioeconomic Post  26.57 %    Socioeconomic % Change   1.64 %    Psych/Spiritual Pre  27.43 %    Psych/Spiritual Post  28.29 %    Psych/Spiritual % Change  3.14 %    Family Pre  25.2 %    Family Post  27.6 %    Family % Change  9.52 %    GLOBAL Pre  24.79 %    GLOBAL Post  26.47 %    GLOBAL % Change  6.78 %      Scores of 19 and below usually indicate a poorer quality of life in these areas.  A difference of  2-3 points is a clinically meaningful difference.  A difference of 2-3 points in the total score of the Quality of Life Index has been associated with significant improvement in overall quality of life, self-image, physical symptoms, and general health in studies assessing change in quality of life.  PHQ-9: Recent Review Flowsheet Data    Depression screen [facility] 2/9 07/15/2018 03/10/2018 03/02/2015 11/29/2014   Decreased Interest 0 0 0 0   Down, Depressed, Hopeless 0 0 0 0   PHQ - 2 Score 0 0 0 0   Altered sleeping 0 0 1 -   Tired, decreased energy '1 3 1 ' -   Change in appetite 0 0 0 -   Feeling bad  or failure about yourself  0 0 0 -   Trouble concentrating 0 0 0 -   Moving slowly or fidgety/restless 0 0 0 -   Suicidal thoughts 0 0 0 -   PHQ-9 Score 1 3  2 -   Difficult doing work/chores Not difficult at all Not difficult at all Not difficult at all -     Interpretation of Total Score  Total Score Depression Severity:  1-4 = Minimal depression, 5-9 = Mild depression, 10-14 = Moderate depression, 15-19 = Moderately severe depression, 20-27 = Severe depression   Psychosocial Evaluation and Intervention: Psychosocial Evaluation - 07/15/18 1049      Discharge Psychosocial Assessment & Intervention   Comments  Shiheem will be completing this program soon and reports he has gained stamina and strength while here.  He is able to get back on the golf course and is excited that the weather will break soon to allow that consistently.  Counselor commended Estate agent for his progress made and his commitment to exercise.         Psychosocial Re-Evaluation: Psychosocial Re-Evaluation    [place] Name 05/13/18 1020 05/20/18 1040 06/19/18 1042         Psychosocial Re-Evaluation   Current issues with  Current Stress Concerns  Current Stress Concerns  Current Stress Concerns     Comments  Derion is in a good place mentally. He is staying busy with the holidays but still finds time to get out to play golf.  He was able to enjoy Thanksgiving and plans to do the same for Christmas and then get back to his diet quickly.  He is doing well with his diet even when he goes out with friends during the week.  He is sleeping good.  He has enjoyed coming to class regularly and getting back into a routine twice a week.   Gemini really doesn't have any stress concerns to report. He plays golf with his friends once a week. He doesn't like when this is interrupted by weather. Happy to see family for Christmas.   Bonham still reports sleeping well and has no other stress concerns.  He plans to continue having breakfaast  with friends and [facility]     Expected Outcomes  Short: Continue to get in his round of golf for himself.  Long: Continue to stay postive.   Short: Continue to play golf. Long: Continue to handle what comes his way with a good attitude and spend time with Senior friends.  Short - have orientation at [place] - maintain exercsie and stress mgmt upon completion of HT     Interventions  -  -  Encouraged to attend Cardiac Rehabilitation for the exercise     Continue Psychosocial Services   -  -  Follow up required by staff        Psychosocial Discharge (Final Psychosocial Re-Evaluation): Psychosocial Re-Evaluation - 06/19/18 1042      Psychosocial Re-Evaluation   Current issues with  Current Stress Concerns    Comments  Umar still reports sleeping well and has no other stress concerns.  He plans to continue having breakfaast with friends and [facility]    Expected Outcomes  Short - have orientation at [place] - maintain exercsie and stress mgmt upon completion of HT    Interventions  Encouraged to attend Cardiac Rehabilitation for the exercise    Continue Psychosocial Services   Follow up required by staff       Vocational Rehabilitation: Provide vocational rehab assistance to qualifying candidates.   Vocational Rehab Evaluation & Intervention: Vocational Rehab - 03/10/18 1255      Initial Vocational Rehab Evaluation & Intervention  Assessment shows need for Vocational Rehabilitation  No       Education: Education Goals: Education classes will be provided on a variety of topics geared toward better understanding of heart health and risk factor modification. Participant will state understanding/return demonstration of topics presented as noted by education test scores.  Learning Barriers/Preferences: Learning Barriers/Preferences - 03/10/18 1255      Learning Barriers/Preferences   Learning Barriers  None    Learning Preferences   None       Education Topics:  AED/CPR: - Group verbal and written instruction with the use of models to demonstrate the basic use of the AED with the basic ABC's of resuscitation.   Cardiac Rehab from 07/17/2018 in [facility] Cardiac and Pulmonary Rehab  Date  04/10/18  Educator  SB  Instruction Review Code  1- Verbalizes Understanding      General Nutrition Guidelines/Fats and Fiber: -Group instruction provided by verbal, written material, models and posters to present the general guidelines for heart healthy nutrition. Gives an explanation and review of dietary fats and fiber.   Cardiac Rehab from 07/17/2018 in [facility] Cardiac and Pulmonary Rehab  Date  04/15/18  Educator  LB  Instruction Review Code  5- Refused Teaching      Controlling Sodium/Reading Food Labels: -Group verbal and written material supporting the discussion of sodium use in heart healthy nutrition. Review and explanation with models, verbal and written materials for utilization of the food label.   Cardiac Rehab from 07/17/2018 in [facility] Cardiac and Pulmonary Rehab  Date  04/17/18  Educator  LB  Instruction Review Code  1- Verbalizes Understanding      Exercise Physiology & General Exercise Guidelines: - Group verbal and written instruction with models to review the exercise physiology of the cardiovascular system and associated critical values. Provides general exercise guidelines with specific guidelines to those with heart or lung disease.    Aerobic Exercise & Resistance Training: - Gives group verbal and written instruction on the various components of exercise. Focuses on aerobic and resistive training programs and the benefits of this training and how to safely progress through these programs..   Flexibility, Balance, Mind/Body Relaxation: Provides group verbal/written instruction on the benefits of flexibility and balance training, including mind/body exercise modes such as yoga, pilates and tai chi.   Demonstration and skill practice provided.   Cardiac Rehab from 07/17/2018 in [facility] Cardiac and Pulmonary Rehab  Date  07/08/18  Educator  AS  Instruction Review Code  5- Refused Teaching      Stress and Anxiety: - Provides group verbal and written instruction about the health risks of elevated stress and causes of high stress.  Discuss the correlation between heart/lung disease and anxiety and treatment options. Review healthy ways to manage with stress and anxiety.   Depression: - Provides group verbal and written instruction on the correlation between heart/lung disease and depressed mood, treatment options, and the stigmas associated with seeking treatment.   Cardiac Rehab from 07/17/2018 in [facility] Cardiac and Pulmonary Rehab  Date  06/17/18  Educator  [facility]  Instruction Review Code  5- Refused Teaching      Anatomy & Physiology of the Heart: - Group verbal and written instruction and models provide basic cardiac anatomy and physiology, with the coronary electrical and arterial systems. Review of Valvular disease and Heart Failure   Cardiac Rehab from 07/17/2018 in [facility] Cardiac and Pulmonary Rehab  Date  07/17/18  Educator  CE  Instruction Review Code  5- Refused  Teaching      Cardiac Procedures: - Group verbal and written instruction to review commonly prescribed medications for heart disease. Reviews the medication, class of the drug, and side effects. Includes the steps to properly store meds and maintain the prescription regimen. (beta blockers and nitrates)   Cardiac Rehab from 07/17/2018 in [facility] Cardiac and Pulmonary Rehab  Date  05/20/18  Educator  SB  Instruction Review Code  5- Refused Teaching      Cardiac Medications I: - Group verbal and written instruction to review commonly prescribed medications for heart disease. Reviews the medication, class of the drug, and side effects. Includes the steps to properly store meds and maintain the prescription regimen.   Cardiac Rehab  from 07/17/2018 in [facility] Cardiac and Pulmonary Rehab  Date  04/01/18  Educator  SB  Instruction Review Code  1- Verbalizes Understanding      Cardiac Medications II: -Group verbal and written instruction to review commonly prescribed medications for heart disease. Reviews the medication, class of the drug, and side effects. (all other drug classes)   Cardiac Rehab from 07/17/2018 in [facility] Cardiac and Pulmonary Rehab  Date  05/13/18  Educator  SB  Instruction Review Code  5- Refused Teaching       Go Sex-Intimacy & Heart Disease, Get SMART - Goal Setting: - Group verbal and written instruction through game format to discuss heart disease and the return to sexual intimacy. Provides group verbal and written material to discuss and apply goal setting through the application of the S.M.A.R.T. Method.   Cardiac Rehab from 07/17/2018 in [facility] Cardiac and Pulmonary Rehab  Date  05/20/18  Educator  SB  Instruction Review Code  5- Refused Teaching      Other Matters of the Heart: - Provides group verbal, written materials and models to describe Stable Angina and Peripheral Artery. Includes description of the disease process and treatment options available to the cardiac patient.   Exercise & Equipment Safety: - Individual verbal instruction and demonstration of equipment use and safety with use of the equipment.   Cardiac Rehab from 07/17/2018 in [facility] Cardiac and Pulmonary Rehab  Date  03/10/18  Educator  [facility]  Instruction Review Code  1- Verbalizes Understanding      Infection Prevention: - Provides verbal and written material to individual with discussion of infection control including proper hand washing and proper equipment cleaning during exercise session.   Falls Prevention: - Provides verbal and written material to individual with discussion of falls prevention and safety.   Cardiac Rehab from 07/17/2018 in [facility] Cardiac and Pulmonary Rehab  Date  03/10/18  Educator  [facility]  Instruction  Review Code  1- Verbalizes Understanding      Diabetes: - Individual verbal and written instruction to review signs/symptoms of diabetes, desired ranges of glucose level fasting, after meals and with exercise. Acknowledge that pre and post exercise glucose checks will be done for 3 sessions at entry of program.   Cardiac Rehab from 07/17/2018 in [facility] Cardiac and Pulmonary Rehab  Date  03/10/18  Educator  [facility]  Instruction Review Code  1- Verbalizes Understanding      Know Your Numbers and Risk Factors: -Group verbal and written instruction about important numbers in your health.  Discussion of what are risk factors and how they play a role in the disease process.  Review of Cholesterol, Blood Pressure, Diabetes, and BMI and the role they play in your overall health.   Cardiac Rehab from 07/17/2018 in  [place] Cardiac and Pulmonary Rehab  Date  05/13/18  Educator  SB  Instruction Review Code  5- Refused Teaching      Sleep Hygiene: -Provides group verbal and written instruction about how sleep can affect your health.  Define sleep hygiene, discuss sleep cycles and impact of sleep habits. Review good sleep hygiene tips.    Cardiac Rehab from 07/17/2018 in [facility] Cardiac and Pulmonary Rehab  Date  07/15/18  Educator  [facility]  Instruction Review Code  5- Refused Teaching      Other: -Provides group and verbal instruction on various topics (see comments)   Knowledge Questionnaire Score: Knowledge Questionnaire Score - 07/15/18 1038      Knowledge Questionnaire Score   Pre Score  23/26    Post Score  25/26   test reviewed with pt today      Core Components/Risk Factors/Patient Goals at Admission: Personal Goals and Risk Factors at Admission - 03/10/18 1253      Core Components/Risk Factors/Patient Goals on Admission    Weight Management  Yes;Weight Loss;Obesity    Intervention  Weight Management: Develop a combined nutrition and exercise program designed to reach desired caloric intake,  while maintaining appropriate intake of nutrient and fiber, sodium and fats, and appropriate energy expenditure required for the weight goal.;Weight Management: Provide education and appropriate resources to help participant work on and attain dietary goals.;Obesity: Provide education and appropriate resources to help participant work on and attain dietary goals.;Weight Management/Obesity: Establish reasonable short term and long term weight goals.    Admit Weight  230 lb (104.3 kg)    Goal Weight: Short Term  225 lb (102.1 kg)    Goal Weight: Long Term  210 lb (95.3 kg)    Expected Outcomes  Short Term: Continue to assess and modify interventions until short term weight is achieved;Long Term: Adherence to nutrition and physical activity/exercise program aimed toward attainment of established weight goal;Understanding recommendations for meals to include 15-35% energy as protein, 25-35% energy from fat, 35-60% energy from carbohydrates, less than 232m of dietary cholesterol, 20-35 gm of total fiber daily;Understanding of distribution of calorie intake throughout the day with the consumption of 4-5 meals/snacks;Weight Loss: Understanding of general recommendations for a balanced deficit meal plan, which promotes 1-2 lb weight loss per week and includes a negative energy balance of [phone] kcal/d    Diabetes  Yes    Intervention  Provide education about signs/symptoms and action to take for hypo/hyperglycemia.;Provide education about proper nutrition, including hydration, and aerobic/resistive exercise prescription along with prescribed medications to achieve blood glucose in normal ranges: Fasting glucose 65-99 mg/dL    Expected Outcomes  Short Term: Participant verbalizes understanding of the signs/symptoms and immediate care of hyper/hypoglycemia, proper foot care and importance of medication, aerobic/resistive exercise and nutrition plan for blood glucose control.;Long Term: Attainment of HbA1C < 7%.     Hypertension  Yes    Intervention  Provide education on lifestyle modifcations including regular physical activity/exercise, weight management, moderate sodium restriction and increased consumption of fresh fruit, vegetables, and low fat dairy, alcohol moderation, and smoking cessation.;Monitor prescription use compliance.    Expected Outcomes  Short Term: Continued assessment and intervention until BP is < 140/941mHG in hypertensive participants. < 130/8051mG in hypertensive participants with diabetes, heart failure or chronic kidney disease.;Long Term: Maintenance of blood pressure at goal levels.    Lipids  Yes    Intervention  Provide education and support for participant on nutrition & aerobic/resistive exercise along  with prescribed medications to achieve LDL <62m, HDL >461m    Expected Outcomes  Short Term: Participant states understanding of desired cholesterol values and is compliant with medications prescribed. Participant is following exercise prescription and nutrition guidelines.;Long Term: Cholesterol controlled with medications as prescribed, with individualized exercise RX and with personalized nutrition plan. Value goals: LDL < 7034mHDL > 40 mg.       Core Components/Risk Factors/Patient Goals Review:  Goals and Risk Factor Review    Row Name 03/25/18 1037 05/13/18 1023 05/20/18 1042 06/19/18 1044       Core Components/Risk Factors/Patient Goals Review   Personal Goals Review  Weight Management/Obesity;Hypertension;Diabetes;Lipids  Weight Management/Obesity;Hypertension;Diabetes;Lipids  Diabetes;Hypertension;Lipids;Weight Management/Obesity  Weight Management/Obesity;Diabetes;Hypertension;Lipids    Review  HarIsayah off to a good start in rehab.  He is working to get his weight down to 210lbs at home.  Currently he is averaging around 215lbs.  He is doing well with his blood sugars at usually around 100-120s in the morning.  He checks them every morning and usualy once in the  afternoon or evening as well.   His has does not check his blood pressures at home as he does not have a cuff, but we talked about getting a new one to check at home.  He is doing well with his medications and has no problems.   HarJemuels been maintaining his weight for the most part. He is up a little today after Thanksgiving.  He is around 225 lbs at home.  He is sure that it will come back down.  He continues to check his sugars regularly at home.  His A1c is down to 7.1 so he has better control.   He is still not checking his pressures at home and has not gotten a new cuff.  He feels comfortable with where his pressures are here and in the office.  He is still doing well with his medicaitons.   HarShanan doing a Mediterranean nutrition plan with his wife. He checks blood sugar every morning. He does not have a BP machine. I recommended that he buy one to have at home to check his BP and also his wife's bp if ever needed. I  HarTera Materd his wife still follow Med diet.  He has not purchased a home BP monitor yet.  FBG have been 104, 123 past 2 days.  He reports taking all meds as directed.  [facility]    Expected Outcomes  Short: Get new blood pressure cuff to check at home.  Long: Continue to work on weight loss.   Short: Continue to check sugars regularly and get weight back down.  Long: Continue to work on weight loss.   Short: Continue to check sugars, eat healthy, and buy a bp machine which we will check for accuracy for him. Long: Continue to work on weight loss and take medications as prescribed.  Short - complete HT Long - maintain exercise ond diet on his own       Core Components/Risk Factors/Patient Goals at Discharge (Final Review):  Goals and Risk Factor Review - 06/19/18 1044      Core Components/Risk Factors/Patient Goals Review   Personal Goals Review  Weight Management/Obesity;Diabetes;Hypertension;Lipids    Review  Thomes and his wife still  follow Med diet.  He has not purchased a home BP monitor yet.  FBG have been 104, 123 past 2 days.  He reports taking all  meds as directed.  [facility].    Expected Outcomes  Short - complete HT Long - maintain exercise ond diet on his own       ITP Comments: ITP Comments    Row Name 03/10/18 1249 03/19/18 0636 03/25/18 1036 04/16/18 0602 05/14/18 0609   ITP Comments  Med Review completed. Initial ITP created. Diagnosis can be found in Care Everywhere 9/3  30 day review.  Continue with ITP unless directed changes per Medical Director review.  New to program  Adama has been to most of the education classes before, but we talked about making sure he comes in for some of them that are newer.   30 day review. Continue with ITP unless direccted changes per Medical Director Chart Review.  30 day review. Continue with ITP unless direccted changes per Medical Director Chart Review.   [place] Name 06/10/18 0636 07/09/18 1349         ITP Comments  30 Day Review. Continue with ITP unless directed changes per Medical Director review  30 day review completed. ITP sent to Dr. Emily Filbert, Medical Director of Cardiac Rehab. Continue with ITP unless changes are made by physician.         Comments: Discharge ITP

## 2020-01-04 ENCOUNTER — Other Ambulatory Visit: Payer: Self-pay

## 2020-01-04 ENCOUNTER — Ambulatory Visit (INDEPENDENT_AMBULATORY_CARE_PROVIDER_SITE_OTHER): Payer: Medicare Other | Admitting: Dermatology

## 2020-01-04 DIAGNOSIS — L509 Urticaria, unspecified: Secondary | ICD-10-CM | POA: Diagnosis not present

## 2020-01-04 DIAGNOSIS — L820 Inflamed seborrheic keratosis: Secondary | ICD-10-CM | POA: Diagnosis not present

## 2020-01-04 DIAGNOSIS — Z85820 Personal history of malignant melanoma of skin: Secondary | ICD-10-CM

## 2020-01-04 DIAGNOSIS — Z86018 Personal history of other benign neoplasm: Secondary | ICD-10-CM

## 2020-01-04 DIAGNOSIS — Z1283 Encounter for screening for malignant neoplasm of skin: Secondary | ICD-10-CM

## 2020-01-04 DIAGNOSIS — Z85828 Personal history of other malignant neoplasm of skin: Secondary | ICD-10-CM

## 2020-01-04 DIAGNOSIS — L814 Other melanin hyperpigmentation: Secondary | ICD-10-CM

## 2020-01-04 DIAGNOSIS — D229 Melanocytic nevi, unspecified: Secondary | ICD-10-CM

## 2020-01-04 DIAGNOSIS — L304 Erythema intertrigo: Secondary | ICD-10-CM

## 2020-01-04 DIAGNOSIS — D1800 Hemangioma unspecified site: Secondary | ICD-10-CM

## 2020-01-04 DIAGNOSIS — L821 Other seborrheic keratosis: Secondary | ICD-10-CM

## 2020-01-04 DIAGNOSIS — L578 Other skin changes due to chronic exposure to nonionizing radiation: Secondary | ICD-10-CM

## 2020-01-04 NOTE — Patient Instructions (Signed)
Recommend Allegra daily to prevent hives

## 2020-01-04 NOTE — Progress Notes (Signed)
Follow-Up Visit   Subjective  Brandon Wall is a 79 y.o. male who presents for the following: Annual Exam (patient has a history of  BCC, SCC, and dysplastic nevi - he hasn't noticed any new or changing moles, lesions, or spots).   The following portions of the chart were reviewed this encounter and updated as appropriate:  Tobacco  Allergies  Meds  Problems  Med Hx  Surg Hx  Fam Hx      Review of Systems:  No other skin or systemic complaints except as noted in HPI or Assessment and Plan.  Objective  Well appearing patient in no apparent distress; mood and affect are within normal limits.  A full examination was performed including scalp, head, eyes, ears, nose, lips, neck, chest, axillae, abdomen, back, buttocks, bilateral upper extremities, bilateral lower extremities, hands, feet, fingers, toes, fingernails, and toenails. All findings within normal limits unless otherwise noted below.  Objective  R mid back lat: Scar with no evidence of recurrence.   Objective  R mid back: Urticarial plaque  Objective  groin: Erythema  Objective  R upper lip: Erythematous keratotic or waxy stuck-on papule or plaque.   Assessment & Plan    History of dysplastic nevus R mid back lat  Clear. Observe for recurrence. Call clinic for new or changing lesions.  Recommend regular skin exams, daily broad-spectrum spf 30+ sunscreen use, and photoprotection.     Urticaria R mid back  Start Allegra 180mg  po QD. If not improving consider adding Singulair. We briefly discussed Xolair, but I do not recommend it at this time.  Erythema intertrigo groin  Start Ketoconazole 2% cream QD.  60g 0RF.  Inflamed seborrheic keratosis R upper lip  Destruction of lesion - R upper lip Complexity: simple   Destruction method: cryotherapy   Informed consent: discussed and consent obtained   Timeout:  patient name, date of birth, surgical site, and procedure verified Lesion destroyed using  liquid nitrogen: Yes   Outcome: patient tolerated procedure well with no complications   Post-procedure details: wound care instructions given    Skin cancer screening   Lentigines - Scattered tan macules - Discussed due to sun exposure - Benign, observe - Call for any changes  Seborrheic Keratoses - Stuck-on, waxy, tan-brown papules and plaques  - Discussed benign etiology and prognosis. - Observe - Call for any changes  Melanocytic Nevi - Tan-brown and/or pink-flesh-colored symmetric macules and papules - Benign appearing on exam today - Observation - Call clinic for new or changing moles - Recommend daily use of broad spectrum spf 30+ sunscreen to sun-exposed areas.   Hemangiomas - Red papules - Discussed benign nature - Observe - Call for any changes  Actinic Damage - diffuse scaly erythematous macules with underlying dyspigmentation - Recommend daily broad spectrum sunscreen SPF 30+ to sun-exposed areas, reapply every 2 hours as needed.  - Call for new or changing lesions.  History of Basal Cell Carcinoma of the Skin - No evidence of recurrence today - Recommend regular full body skin exams - Recommend daily broad spectrum sunscreen SPF 30+ to sun-exposed areas, reapply every 2 hours as needed.  - Call if any new or changing lesions are noted between office visits  History of Squamous Cell Carcinoma of the Skin - No evidence of recurrence today - No lymphadenopathy - Recommend regular full body skin exams - Recommend daily broad spectrum sunscreen SPF 30+ to sun-exposed areas, reapply every 2 hours as needed.  - Call if  any new or changing lesions are noted between office visits  History of Melanoma - No evidence of recurrence today - No lymphadenopathy - Recommend regular full body skin exams - Recommend daily broad spectrum sunscreen SPF 30+ to sun-exposed areas, reapply every 2 hours as needed.  - Call if any new or changing lesions are noted between  office visits  Skin cancer screening performed today.  Return in about 6 months (around 07/06/2020) for TBSE.  Luther Redo, CMA, am acting as scribe for Sarina Ser, MD .  Documentation: I have reviewed the above documentation for accuracy and completeness, and I agree with the above.  Sarina Ser, MD

## 2020-01-05 ENCOUNTER — Encounter: Payer: Self-pay | Admitting: Dermatology

## 2020-02-18 ENCOUNTER — Ambulatory Visit (INDEPENDENT_AMBULATORY_CARE_PROVIDER_SITE_OTHER): Payer: Medicare Other | Admitting: Dermatology

## 2020-02-18 ENCOUNTER — Other Ambulatory Visit: Payer: Self-pay

## 2020-02-18 DIAGNOSIS — D485 Neoplasm of uncertain behavior of skin: Secondary | ICD-10-CM

## 2020-02-18 DIAGNOSIS — L308 Other specified dermatitis: Secondary | ICD-10-CM

## 2020-02-18 MED ORDER — TRIAMCINOLONE ACETONIDE 0.1 % EX OINT: 1.0000 "application " | TOPICAL_OINTMENT | Freq: Two times a day (BID) | CUTANEOUS | 4 refills | Status: AC | PRN

## 2020-02-18 NOTE — Progress Notes (Signed)
° °  Follow-Up Visit   Subjective  Brandon Wall is a 79 y.o. male who presents for the following: Lesion.  Patient here today for a spot at the right upper lip, present for about a month. No symptoms with it but a dermatology PA saw it and suggested he have it evaluated. 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 He normally sees Dr. Nehemiah Massed and had a spot treated with LN2 at right upper lip at his last appt that was diagnosed as an ISK.   He also has an itchy area at the shoulder which has been bothersome lately.  The following portions of the chart were reviewed this encounter and updated as appropriate:  Tobacco   Allergies   Meds   Problems   Med Hx   Surg Hx   Fam Hx       Review of Systems:  No other skin or systemic complaints except as noted in HPI or Assessment and Plan.  Objective  Well appearing patient in no apparent distress; mood and affect are within normal limits.  A focused examination was performed including face. Relevant physical exam findings are noted in the Assessment and Plan.  Objective  Right Upper Lip Vermilion Border: 0.5cm erythematous scaly papule     Objective  left shoulder: Scaly erythematous thin plaque   Assessment & Plan  Neoplasm of uncertain behavior of skin Right Upper Lip Vermilion Border  Skin / nail biopsy Type of biopsy: tangential   Informed consent: discussed and consent obtained   Timeout: patient name, date of birth, surgical site, and procedure verified   Patient was prepped and draped in usual sterile fashion: Area prepped with isopropyl alcohol. Anesthesia: the lesion was anesthetized in a standard fashion   Anesthetic:  1% lidocaine w/ epinephrine 1-100,000 buffered w/ 8.4% NaHCO3 Instrument used: flexible razor blade   Hemostasis achieved with: aluminum chloride   Outcome: patient tolerated procedure well   Post-procedure details: wound care instructions given   Additional details:  Mupirocin  and a bandage applied  Specimen 1 - Surgical pathology Differential Diagnosis: r/o SCC vs BCC vs ISK vs Verruca Check Margins: No 0.5cm erythematous scaly papule  Other eczema left shoulder  Start TMC 0.1% ointment to affected areas 1-2 times daily as needed for itch. Avoid applying to face, groin, and axilla. Use as directed. Risk of skin atrophy with long-term use reviewed.  #80 4RF  Topical steroids (such as triamcinolone, fluocinolone, fluocinonide, mometasone, clobetasol, halobetasol, betamethasone, hydrocortisone) can cause thinning and lightening of the skin if they are used for too long in the same area. Your physician has selected the right strength medicine for your problem and area affected on the body. Please use your medication only as directed by your physician to prevent side effects.    Ordered Medications: triamcinolone ointment (KENALOG) 0.1 %  Return for as scheduled with Dr. Nehemiah Massed for TBSE.  Graciella Belton, RMA, am acting as scribe for Forest Gleason, MD .  Documentation: I have reviewed the above documentation for accuracy and completeness, and I agree with the above.  Forest Gleason, MD

## 2020-02-18 NOTE — Patient Instructions (Addendum)
Wound Care Instructions  1. Cleanse wound gently with soap and water once a day then pat dry with clean gauze. Apply a thing coat of Petrolatum (petroleum jelly, "Vaseline") over the wound (unless you have an allergy to this). We recommend that you use a new, sterile tube of Vaseline. Do not pick or remove scabs. Do not remove the yellow or white "healing tissue" from the base of the wound.  2. Cover the wound with fresh, clean, nonstick gauze and secure with paper tape. You may use Band-Aids in place of gauze and tape if the would is small enough, but would recommend trimming much of the tape off as there is often too much. Sometimes Band-Aids can irritate the skin.  3. You should call the office for your biopsy report after 1 week if you have not already been contacted.  4. If you experience any problems, such as abnormal amounts of bleeding, swelling, significant bruising, significant pain, or evidence of infection, please call the office immediately.  5. FOR ADULT SURGERY PATIENTS: If you need something for pain relief you may take 1 extra strength Tylenol (acetaminophen) AND 2 Ibuprofen (200mg  each) together every 4 hours as needed for pain. (do not take these if you are allergic to them or if you have a reason you should not take them.) Typically, you may only need pain medication for 1 to 3 days.   Topical steroids (such as triamcinolone, fluocinolone, fluocinonide, mometasone, clobetasol, halobetasol, betamethasone, hydrocortisone) can cause thinning and lightening of the skin if they are used for too long in the same area. Your physician has selected the right strength medicine for your problem and area affected on the body. Please use your medication only as directed by your physician to prevent side effects.   Recommend Gold Bond Rapid Relief Anti-Itch cream up to 3 times per day to areas that are itchy.  Gentle Skin Care Guide  1. Bathe no more than once a day.  2. Avoid bathing in  hot water  3. Use a mild soap like Dove, Vanicream, Cetaphil, CeraVe. Can use Lever 2000 or Cetaphil antibacterial soap  4. Use soap only where you need it. On most days, use it under your arms, between your legs, and on your feet. Let the water rinse other areas unless visibly dirty.  5. When you get out of the bath/shower, use a towel to gently blot your skin dry, don't rub it.  6. While your skin is still a little damp, apply a moisturizing cream such as Vanicream, CeraVe, Cetaphil, Eucerin, Sarna lotion or plain Vaseline Jelly. For hands apply Neutrogena [place] Hand Cream or Excipial Hand Cream.  7. Reapply moisturizer any time you start to itch or feel dry.  8. Sometimes using free and clear laundry detergents can be helpful. Fabric softener sheets should be avoided. Downy Free & Gentle liquid, or any liquid fabric softener that is free of dyes and perfumes, it acceptable to use  9. If your doctor has given you prescription creams you may apply moisturizers over them

## 2020-02-23 ENCOUNTER — Telehealth: Payer: Self-pay

## 2020-02-23 NOTE — Progress Notes (Signed)
Skin , right upper lip vermilion border VERRUCA VULGARIS, IRRITATED --> Recommend LN2 in clinic  MAs please call

## 2020-02-23 NOTE — Telephone Encounter (Signed)
Patient called and informed of biopsy results, patient verbalized understanding. Appt. Made for tomorrow at 12:15pm for LN2 treatment

## 2020-02-24 ENCOUNTER — Ambulatory Visit (INDEPENDENT_AMBULATORY_CARE_PROVIDER_SITE_OTHER): Payer: Medicare Other | Admitting: Dermatology

## 2020-02-24 ENCOUNTER — Other Ambulatory Visit: Payer: Self-pay

## 2020-02-24 DIAGNOSIS — B079 Viral wart, unspecified: Secondary | ICD-10-CM | POA: Diagnosis not present

## 2020-02-24 NOTE — Progress Notes (Signed)
   Follow-Up Visit   Subjective  Brandon Wall is a 79 y.o. male who presents for the following: Follow-up (BX proven Wart).  Patient presents today for Follow up OV 02/18/20 for BX proven wart on R upper lip @ Vermillion border  The following portions of the chart were reviewed this encounter and updated as appropriate:  Tobacco  Allergies  Meds  Problems  Med Hx  Surg Hx  Fam Hx      Review of Systems:  No other skin or systemic complaints except as noted in HPI or Assessment and Plan.  Objective  Well appearing patient in no apparent distress; mood and affect are within normal limits.  A focused examination was performed including Right upper lip vermillion border. Relevant physical exam findings are noted in the Assessment and Plan.  Objective  Right Upper Vermilion Lip: Verrucous papules   Assessment & Plan  Viral warts, unspecified type Right Upper Vermilion Lip  Bx proven wart Discussed viral etiology and risk of spread.  Discussed multiple treatments may be required to clear warts.  Discussed possible post-treatment dyspigmentation and risk of recurrence.  Cryotherapy today Prior to procedure, discussed risks of blister formation, small wound, skin dyspigmentation, or rare scar following cryotherapy.    Destruction of lesion - Right Upper Vermilion Lip  Destruction method: cryotherapy   Informed consent: discussed and consent obtained   Lesion destroyed using liquid nitrogen: Yes   Outcome: patient tolerated procedure well with no complications   Post-procedure details: wound care instructions given    Return in 1 month (on 03/25/2020) for Wart.  IDonzetta Kohut, CMA, am acting as scribe for Forest Gleason, MD .  Documentation: I have reviewed the above documentation for accuracy and completeness, and I agree with the above.  Forest Gleason, MD

## 2020-02-24 NOTE — Patient Instructions (Signed)
Recommend daily broad spectrum sunscreen SPF 30+ to sun-exposed areas, reapply every 2 hours as needed. Call for new or changing lesions.  Prior to procedure, discussed risks of blister formation, small wound, skin dyspigmentation, or rare scar following cryotherapy.  Liquid nitrogen was applied for 10-12 seconds to the skin lesion and the expected blistering or scabbing reaction explained. Do not pick at the area. Patient reminded to expect hypopigmented scars from the procedure. Return if lesion fails to fully resolve.  Cryotherapy Aftercare  . Wash gently with soap and water everyday.   Marland Kitchen Apply Vaseline and Band-Aid daily until healed.  Discussed viral etiology and risk of spread.  Discussed multiple treatments may be required to clear warts.  Discussed possible post-treatment dyspigmentation and risk of recurrence.

## 2020-03-07 ENCOUNTER — Encounter: Payer: Self-pay | Admitting: Dermatology

## 2020-05-11 DIAGNOSIS — U071 COVID-19: Secondary | ICD-10-CM

## 2020-05-11 HISTORY — DX: COVID-19: U07.1

## 2020-06-09 ENCOUNTER — Other Ambulatory Visit (INDEPENDENT_AMBULATORY_CARE_PROVIDER_SITE_OTHER): Payer: Medicare Other

## 2020-06-09 ENCOUNTER — Other Ambulatory Visit: Payer: Self-pay | Admitting: Family Medicine

## 2020-06-09 DIAGNOSIS — R059 Cough, unspecified: Secondary | ICD-10-CM

## 2020-06-09 DIAGNOSIS — Z20822 Contact with and (suspected) exposure to covid-19: Secondary | ICD-10-CM

## 2020-06-11 DIAGNOSIS — U071 COVID-19: Secondary | ICD-10-CM | POA: Insufficient documentation

## 2020-06-11 LAB — SARS-COV-2, NAA 2 DAY TAT

## 2020-06-11 LAB — NOVEL CORONAVIRUS, NAA: SARS-CoV-2, NAA: DETECTED — AB

## 2020-06-12 ENCOUNTER — Encounter: Payer: Self-pay | Admitting: Nurse Practitioner

## 2020-06-12 ENCOUNTER — Other Ambulatory Visit: Payer: Self-pay | Admitting: Nurse Practitioner

## 2020-06-12 DIAGNOSIS — U071 COVID-19: Secondary | ICD-10-CM

## 2020-06-12 DIAGNOSIS — E119 Type 2 diabetes mellitus without complications: Secondary | ICD-10-CM | POA: Insufficient documentation

## 2020-06-12 DIAGNOSIS — I480 Paroxysmal atrial fibrillation: Secondary | ICD-10-CM | POA: Insufficient documentation

## 2020-06-12 DIAGNOSIS — I10 Essential (primary) hypertension: Secondary | ICD-10-CM | POA: Insufficient documentation

## 2020-06-12 NOTE — Progress Notes (Signed)
I connected by phone with Brandon Wall on 06/12/2020 at 1:33 PM to discuss the potential use of a new treatment for mild to moderate COVID-19 viral infection in non-hospitalized patients.  This patient is a 80 y.o. male that meets the FDA criteria for Emergency Use Authorization of COVID monoclonal antibody casirivimab/imdevimab, bamlanivimab/etesevimab, or sotrovimab.  Has a (+) direct SARS-CoV-2 viral test result  Has mild or moderate COVID-19   Is NOT hospitalized due to COVID-19  Is within 10 days of symptom onset  Has at least one of the high risk factor(s) for progression to severe COVID-19 and/or hospitalization as defined in EUA.  Specific high risk criteria : Older age (>/= 81 yo), BMI > 25, Diabetes and Cardiovascular disease or hypertension   I have spoken and communicated the following to the patient or parent/caregiver regarding COVID monoclonal antibody treatment:  1. FDA has authorized the emergency use for the treatment of mild to moderate COVID-19 in adults and pediatric patients with positive results of direct SARS-CoV-2 viral testing who are 19 years of age and older weighing at least 40 kg, and who are at high risk for progressing to severe COVID-19 and/or hospitalization.  2. The significant known and potential risks and benefits of COVID monoclonal antibody, and the extent to which such potential risks and benefits are unknown.  3. Information on available alternative treatments and the risks and benefits of those alternatives, including clinical trials.  4. Patients treated with COVID monoclonal antibody should continue to self-isolate and use infection control measures (e.g., wear mask, isolate, social distance, avoid sharing personal items, clean and disinfect "high touch" surfaces, and frequent handwashing) according to CDC guidelines.   5. The patient or parent/caregiver has the option to accept or refuse COVID monoclonal antibody treatment.  After reviewing  this information with the patient, the patient has agreed to receive one of the available covid 19 monoclonal antibodies and will be provided an appropriate fact sheet prior to infusion.   [name], NP 06/12/2020 1:33 PM

## 2020-06-13 ENCOUNTER — Ambulatory Visit (HOSPITAL_COMMUNITY)
Admission: RE | Admit: 2020-06-13 | Discharge: 2020-06-13 | Disposition: A | Discharge status: Home / Self Care | Payer: Medicare Other | Source: Ambulatory Visit | Attending: Pulmonary Disease | Admitting: Pulmonary Disease

## 2020-06-13 DIAGNOSIS — Z6829 Body mass index (BMI) 29.0-29.9, adult: Secondary | ICD-10-CM | POA: Diagnosis not present

## 2020-06-13 DIAGNOSIS — I10 Essential (primary) hypertension: Secondary | ICD-10-CM | POA: Diagnosis not present

## 2020-06-13 DIAGNOSIS — U071 COVID-19: Secondary | ICD-10-CM | POA: Diagnosis not present

## 2020-06-13 MED ORDER — DIPHENHYDRAMINE HCL 50 MG/ML IJ SOLN: 50.0000 mg | Freq: Once | INTRAMUSCULAR | Status: DC | PRN

## 2020-06-13 MED ORDER — SODIUM CHLORIDE 0.9 % IV SOLN: INTRAVENOUS | Status: DC | PRN

## 2020-06-13 MED ORDER — ALBUTEROL SULFATE HFA 108 (90 BASE) MCG/ACT IN AERS: 2.0000 | INHALATION_SPRAY | Freq: Once | RESPIRATORY_TRACT | Status: DC | PRN

## 2020-06-13 MED ORDER — EPINEPHRINE 0.3 MG/0.3ML IJ SOAJ: 0.3000 mg | Freq: Once | INTRAMUSCULAR | Status: DC | PRN

## 2020-06-13 MED ORDER — SOTROVIMAB 500 MG/8ML IV SOLN
500.0000 mg | Freq: Once | INTRAVENOUS | Status: AC
  Administered 2020-06-13: 500 mg via INTRAVENOUS

## 2020-06-13 MED ORDER — FAMOTIDINE IN NACL 20-0.9 MG/50ML-% IV SOLN: 20.0000 mg | Freq: Once | INTRAVENOUS | Status: DC | PRN

## 2020-06-13 MED ORDER — METHYLPREDNISOLONE SODIUM SUCC 125 MG IJ SOLR: 125.0000 mg | Freq: Once | INTRAMUSCULAR | Status: DC | PRN

## 2020-06-13 NOTE — Progress Notes (Signed)
Diagnosis: COVID-19  Physician: Dr. Patrick Wright  Procedure: Covid Infusion Clinic Med: Sotrovimab infusion - Provided patient with sotrovimab fact sheet for patients, parents, and caregivers prior to infusion.   Complications: No immediate complications noted  Discharge: Discharged home    

## 2020-06-13 NOTE — Progress Notes (Signed)
Patient reviewed Fact Sheet for Patients, Parents, and Caregivers for Emergency Use Authorization (EUA) of Sotrovimab for the Treatment of Coronavirus. Patient also reviewed and is agreeable to the estimated cost of treatment. Patient is agreeable to proceed.   

## 2020-06-13 NOTE — Discharge Instructions (Signed)
10 Things You Can Do to Manage Your COVID-19 Symptoms at Home If you have possible or confirmed COVID-19: 1. Stay home from work and school. And stay away from other public places. If you must go out, avoid using any kind of public transportation, ridesharing, or taxis. 2. Monitor your symptoms carefully. If your symptoms get worse, call your healthcare provider immediately. 3. Get rest and stay hydrated. 4. If you have a medical appointment, call the healthcare provider ahead of time and tell them that you have or may have COVID-19. 5. For medical emergencies, call 911 and notify the dispatch personnel that you have or may have COVID-19. 6. Cover your cough and sneezes with a tissue or use the inside of your elbow. 7. Wash your hands often with soap and water for at least 20 seconds or clean your hands with an alcohol-based hand sanitizer that contains at least 60% alcohol. 8. As much as possible, stay in a specific room and away from other people in your home. Also, you should use a separate bathroom, if available. If you need to be around other people in or outside of the home, wear a mask. 9. Avoid sharing personal items with other people in your household, like dishes, towels, and bedding. 10. Clean all surfaces that are touched often, like counters, tabletops, and doorknobs. Use household cleaning sprays or wipes according to the label instructions. cdc.gov/coronavirus 12/10/2018 This information is not intended to replace advice given to you by your health care provider. Make sure you discuss any questions you have with your health care provider. Document Revised: 05/14/2019 Document Reviewed: 05/14/2019 Elsevier Patient Education  2020 [employer] What types of side effects do monoclonal antibody drugs cause?  Common side effects  In general, the more common side effects caused by monoclonal antibody drugs include: . Allergic reactions, such as hives or itching . Flu-like signs and  symptoms, including chills, fatigue, fever, and muscle aches and pains . Nausea, vomiting . Diarrhea . Skin rashes . Low blood pressure   The CDC is recommending patients who receive monoclonal antibody treatments wait at least 90 days before being vaccinated.  Currently, there are no data on the safety and efficacy of mRNA COVID-19 vaccines in persons who received monoclonal antibodies or convalescent plasma as part of COVID-19 treatment. Based on the estimated half-life of such therapies as well as evidence suggesting that reinfection is uncommon in the 90 days after initial infection, vaccination should be deferred for at least 90 days, as a precautionary measure until additional information becomes available, to avoid interference of the antibody treatment with vaccine-induced immune responses. If you have any questions or concerns after the infusion please call the Advanced Practice Provider on call at [phone]. This number is ONLY intended for your use regarding questions or concerns about the infusion post-treatment side-effects.  Please do not provide this number to others for use. For return to work notes please contact your primary care provider.   If someone you know is interested in receiving treatment please have them call the COVID hotline at [phone].   

## 2020-06-24 ENCOUNTER — Emergency Department: Payer: Medicare Other

## 2020-06-24 ENCOUNTER — Other Ambulatory Visit: Payer: Self-pay

## 2020-06-24 ENCOUNTER — Observation Stay
Admission: EM | Admit: 2020-06-24 | Discharge: 2020-06-25 | Disposition: A | Discharge status: Home / Self Care | Payer: Medicare Other | Attending: Internal Medicine | Admitting: Internal Medicine

## 2020-06-24 ENCOUNTER — Encounter
Admission: EM | Disposition: A | Discharge status: Home / Self Care | Payer: Self-pay | Source: Home / Self Care | Attending: Internal Medicine

## 2020-06-24 DIAGNOSIS — Z7984 Long term (current) use of oral hypoglycemic drugs: Secondary | ICD-10-CM | POA: Diagnosis not present

## 2020-06-24 DIAGNOSIS — I4891 Unspecified atrial fibrillation: Secondary | ICD-10-CM | POA: Diagnosis not present

## 2020-06-24 DIAGNOSIS — Z79899 Other long term (current) drug therapy: Secondary | ICD-10-CM | POA: Diagnosis not present

## 2020-06-24 DIAGNOSIS — I472 Ventricular tachycardia: Secondary | ICD-10-CM | POA: Insufficient documentation

## 2020-06-24 DIAGNOSIS — E119 Type 2 diabetes mellitus without complications: Secondary | ICD-10-CM

## 2020-06-24 DIAGNOSIS — R0789 Other chest pain: Secondary | ICD-10-CM | POA: Diagnosis not present

## 2020-06-24 DIAGNOSIS — Z951 Presence of aortocoronary bypass graft: Secondary | ICD-10-CM | POA: Insufficient documentation

## 2020-06-24 DIAGNOSIS — Z7901 Long term (current) use of anticoagulants: Secondary | ICD-10-CM | POA: Diagnosis not present

## 2020-06-24 DIAGNOSIS — Z7982 Long term (current) use of aspirin: Secondary | ICD-10-CM | POA: Insufficient documentation

## 2020-06-24 DIAGNOSIS — I110 Hypertensive heart disease with heart failure: Secondary | ICD-10-CM | POA: Insufficient documentation

## 2020-06-24 DIAGNOSIS — R079 Chest pain, unspecified: Secondary | ICD-10-CM | POA: Diagnosis present

## 2020-06-24 DIAGNOSIS — I4729 Other ventricular tachycardia: Secondary | ICD-10-CM

## 2020-06-24 DIAGNOSIS — I2510 Atherosclerotic heart disease of native coronary artery without angina pectoris: Secondary | ICD-10-CM | POA: Diagnosis not present

## 2020-06-24 DIAGNOSIS — U071 COVID-19: Principal | ICD-10-CM | POA: Insufficient documentation

## 2020-06-24 DIAGNOSIS — I10 Essential (primary) hypertension: Secondary | ICD-10-CM | POA: Diagnosis not present

## 2020-06-24 DIAGNOSIS — I5020 Unspecified systolic (congestive) heart failure: Secondary | ICD-10-CM | POA: Diagnosis not present

## 2020-06-24 DIAGNOSIS — I5022 Chronic systolic (congestive) heart failure: Secondary | ICD-10-CM | POA: Diagnosis not present

## 2020-06-24 LAB — CBC
HCT: 43 % (ref 39.0–52.0)
Hemoglobin: 14.3 g/dL (ref 13.0–17.0)
MCH: 31.6 pg (ref 26.0–34.0)
MCHC: 33.3 g/dL (ref 30.0–36.0)
MCV: 95.1 fL (ref 80.0–100.0)
Platelets: 190 10*3/uL (ref 150–400)
RBC: 4.52 MIL/uL (ref 4.22–5.81)
RDW: 13.1 % (ref 11.5–15.5)
WBC: 8.7 10*3/uL (ref 4.0–10.5)
nRBC: 0 % (ref 0.0–0.2)

## 2020-06-24 LAB — RESP PANEL BY RT-PCR (FLU A&B, COVID) ARPGX2
Influenza A by PCR: NEGATIVE
Influenza B by PCR: NEGATIVE
SARS Coronavirus 2 by RT PCR: POSITIVE — AB

## 2020-06-24 LAB — BASIC METABOLIC PANEL
Anion gap: 14 (ref 5–15)
BUN: 29 mg/dL — ABNORMAL HIGH (ref 8–23)
CO2: 23 mmol/L (ref 22–32)
Calcium: 9.6 mg/dL (ref 8.9–10.3)
Chloride: 103 mmol/L (ref 98–111)
Creatinine, Ser: 1.97 mg/dL — ABNORMAL HIGH (ref 0.61–1.24)
GFR, Estimated: 34 mL/min — ABNORMAL LOW (ref >60)
Glucose, Bld: 230 mg/dL — ABNORMAL HIGH (ref 70–99)
Potassium: 4.3 mmol/L (ref 3.5–5.1)
Sodium: 140 mmol/L (ref 135–145)

## 2020-06-24 LAB — TROPONIN I (HIGH SENSITIVITY)
Troponin I (High Sensitivity): 8 ng/L (ref <18)
Troponin I (High Sensitivity): 4 ng/L (ref <18)

## 2020-06-24 LAB — MAGNESIUM: Magnesium: 2 mg/dL (ref 1.7–2.4)

## 2020-06-24 LAB — CBG MONITORING, ED: Glucose-Capillary: 188 mg/dL — ABNORMAL HIGH (ref 70–99)

## 2020-06-24 SURGERY — CORONARY/GRAFT ACUTE MI REVASCULARIZATION
Anesthesia: Moderate Sedation

## 2020-06-24 MED ORDER — METOPROLOL SUCCINATE ER 50 MG PO TB24
100.0000 mg | ORAL_TABLET | Freq: Every day | ORAL | Status: DC
Start: 2020-06-25 — End: 2020-06-25
  Administered 2020-06-25: 100 mg via ORAL
  Filled 2020-06-24: qty 2

## 2020-06-24 MED ORDER — INSULIN ASPART 100 UNIT/ML ~~LOC~~ SOLN: 0.0000 [IU] | Freq: Every day | SUBCUTANEOUS | Status: DC

## 2020-06-24 MED ORDER — ONDANSETRON HCL 4 MG PO TABS: 4.0000 mg | ORAL_TABLET | Freq: Four times a day (QID) | ORAL | Status: DC | PRN

## 2020-06-24 MED ORDER — FUROSEMIDE 20 MG PO TABS
20.0000 mg | ORAL_TABLET | Freq: Every day | ORAL | Status: DC
  Administered 2020-06-25: 20 mg via ORAL
  Filled 2020-06-24: qty 1

## 2020-06-24 MED ORDER — ACETAMINOPHEN 325 MG PO TABS: 325.0000 mg | ORAL_TABLET | Freq: Four times a day (QID) | ORAL | Status: DC | PRN

## 2020-06-24 MED ORDER — MEXILETINE HCL 200 MG PO CAPS
200.0000 mg | ORAL_CAPSULE | Freq: Three times a day (TID) | ORAL | Status: DC
  Administered 2020-06-24 – 2020-06-25 (×2): 200 mg via ORAL
  Filled 2020-06-24 (×6): qty 1

## 2020-06-24 MED ORDER — HEPARIN (PORCINE) 25000 UT/250ML-% IV SOLN
1400.0000 [IU]/h | INTRAVENOUS | Status: DC
  Administered 2020-06-24: 1400 [IU]/h via INTRAVENOUS
  Filled 2020-06-24: qty 250

## 2020-06-24 MED ORDER — SPIRONOLACTONE 25 MG PO TABS
12.5000 mg | ORAL_TABLET | Freq: Every day | ORAL | Status: DC
  Administered 2020-06-25: 12.5 mg via ORAL
  Filled 2020-06-24: qty 0.5

## 2020-06-24 MED ORDER — METFORMIN HCL 500 MG PO TABS
500.0000 mg | ORAL_TABLET | Freq: Two times a day (BID) | ORAL | Status: DC
  Administered 2020-06-25: 500 mg via ORAL
  Filled 2020-06-24: qty 1

## 2020-06-24 MED ORDER — INSULIN ASPART 100 UNIT/ML ~~LOC~~ SOLN: 0.0000 [IU] | Freq: Three times a day (TID) | SUBCUTANEOUS | Status: DC

## 2020-06-24 MED ORDER — ASPIRIN EC 81 MG PO TBEC
81.0000 mg | DELAYED_RELEASE_TABLET | Freq: Every day | ORAL | Status: DC
  Administered 2020-06-25: 81 mg via ORAL

## 2020-06-24 MED ORDER — FLUTICASONE PROPIONATE 50 MCG/ACT NA SUSP
2.0000 | Freq: Every day | NASAL | Status: DC | PRN
  Filled 2020-06-24: qty 16

## 2020-06-24 MED ORDER — NITROGLYCERIN 0.4 MG SL SUBL: 0.4000 mg | SUBLINGUAL_TABLET | SUBLINGUAL | Status: DC | PRN

## 2020-06-24 MED ORDER — PRAMIPEXOLE DIHYDROCHLORIDE 0.25 MG PO TABS
0.2500 mg | ORAL_TABLET | Freq: Every day | ORAL | Status: DC
  Administered 2020-06-24: 0.25 mg via ORAL
  Filled 2020-06-24 (×3): qty 1

## 2020-06-24 MED ORDER — MELATONIN 5 MG PO TABS
10.0000 mg | ORAL_TABLET | Freq: Every evening | ORAL | Status: DC | PRN
Start: 2020-06-24 — End: 2020-06-25

## 2020-06-24 MED ORDER — APIXABAN 2.5 MG PO TABS
2.5000 mg | ORAL_TABLET | Freq: Two times a day (BID) | ORAL | Status: DC
  Filled 2020-06-24: qty 1

## 2020-06-24 MED ORDER — ACETAMINOPHEN 325 MG RE SUPP: 325.0000 mg | Freq: Four times a day (QID) | RECTAL | Status: DC | PRN

## 2020-06-24 MED ORDER — ROSUVASTATIN CALCIUM 20 MG PO TABS
40.0000 mg | ORAL_TABLET | Freq: Every day | ORAL | Status: DC
Start: 2020-06-25 — End: 2020-06-25
  Administered 2020-06-25: 40 mg via ORAL
  Filled 2020-06-24: qty 2

## 2020-06-24 MED ORDER — ONDANSETRON HCL 4 MG/2ML IJ SOLN: 4.0000 mg | Freq: Four times a day (QID) | INTRAMUSCULAR | Status: DC | PRN

## 2020-06-24 NOTE — ED Provider Notes (Signed)
[facility] Emergency Department Provider Note   ____________________________________________   Event Date/Time   First MD Initiated Contact with Patient 06/24/20 1145     (approximate)  I have reviewed the triage vital signs and the nursing notes.   HISTORY  Chief Complaint Chest Pain    HPI Brandon Wall is a 80 y.o. male with possible history of hypertension, diabetes, paroxysmal atrial fibrillation on Eliquis, VT status post ablation, and CAD status post CABG who presents to the ED for chest pain.  Patient reports that starting earlier today he has had intermittent episodes of chest pain.  He describes this as a pressure that is not exacerbated or alleviated by anything in particular.  When the pain comes on, he will start to feel lightheaded like he might pass out.  He describes some shortness of breath with these episodes.  They seem to last for 30 seconds to 1 minute at a time before resolving on their own.  He is otherwise felt well recently with no fevers or cough, was diagnosed with COVID-19 a couple of weeks ago but symptoms related to this have since resolved.  He was recently admitted to [facility], where he follows with cardiology, for a ablation related to recurrent ventricular tachycardia.        Past Medical History:  Diagnosis Date   Actinic keratosis    COVID-19 virus infection 05/2020   Dysplastic nevus 08/16/2009   right mid back lat   Heart disease    Hx of basal cell carcinoma 11/11/2012   L superior lateral deltoid   Hx of melanoma in situ 09/11/2012   L spinal upper back   Hx of squamous cell carcinoma 10/04/2014   Crown scalp, L sideburn   Hypertension    Morbid obesity ([place])    PAF (paroxysmal atrial fibrillation) ([place])    Squamous cell carcinoma of skin 01/12/2010   left dorsum hand   Squamous cell carcinoma of skin 10/04/2014   crown scalp   Squamous cell carcinoma of skin 07/14/2015   left sideburn   Type  II diabetes mellitus [facility])     Patient Active Problem List   Diagnosis Date Noted   Hypertension    Morbid obesity ([place])    PAF (paroxysmal atrial fibrillation) ([place])    Type II diabetes mellitus ([place])    COVID-19 virus infection 06/2020   GERD (gastroesophageal reflux disease) 06/11/2016   Chest pain 09/10/2014   ST elevation (STEMI) myocardial infarction ([place]) 07/18/2014   Diabetes ([place]) 11/06/2011   A-fib ([place]) 08/01/2011   FOM (frequency of micturition) 08/01/2011   Automatic implantable cardioverter-defibrillator in situ 07/31/2011   Arteriosclerosis of coronary artery 07/31/2011   HLD (hyperlipidemia) 07/31/2011   Cardiomyopathy, ischemic 07/31/2011   Arthritis, degenerative 07/31/2011   Head revolving around 07/31/2011   Idiopathic ventricular tachycardia ([place]) 07/31/2011   Coronary artery disease of bypass graft of native heart with stable angina pectoris ([place]) 07/31/2011    Past Surgical History:  Procedure Laterality Date   INSERT / REPLACE / REMOVE PACEMAKER  2010   MELANOMA EXCISION Left 09/24/2012   spinal upper back    Prior to Admission medications   Medication Sig Start Date End Date Taking? Authorizing Provider  apixaban (ELIQUIS) 5 MG TABS tablet Take 5 mg by mouth 2 (two) times daily. 02/25/14  Yes [provider]  aspirin 81 MG EC tablet Take 81 mg by mouth daily.   Yes [provider]  fluticasone (FLONASE) 50 MCG/ACT  nasal spray Place 2 sprays into the nose daily as needed for allergies or rhinitis.   Yes [provider]  furosemide (LASIX) 20 MG tablet Take 20 mg by mouth daily. 04/10/20  Yes [provider]  glipiZIDE (GLUCOTROL XL) 10 MG 24 hr tablet Take 10 mg by mouth daily. 05/01/20  Yes [provider]  JARDIANCE 25 MG TABS tablet Take 25 mg by mouth daily. 03/05/18  Yes [provider]  Melatonin 10 MG TABS Take 10 mg by mouth at bedtime.   Yes [provider]   metFORMIN (GLUCOPHAGE) 500 MG tablet Take 500 mg by mouth 2 (two) times daily. 06/16/20  Yes [provider]  mexiletine (MEXITIL) 200 MG capsule Take 200 mg by mouth 3 (three) times daily. 06/21/20  Yes [provider]  pramipexole (MIRAPEX) 0.25 MG tablet Take 0.25 mg by mouth at bedtime. 05/08/20  Yes [provider]  rosuvastatin (CRESTOR) 40 MG tablet Take 40 mg by mouth daily. 07/14/14 06/24/20 Yes [provider]  spironolactone (ALDACTONE) 25 MG tablet Take 12.5 mg by mouth daily. 02/01/18  Yes [provider]  glucose blood test strip  02/19/13   [provider]  metoprolol succinate (TOPROL-XL) 100 MG 24 hr tablet Take 50 mg by mouth.  10/27/14 03/10/18  [provider]  nitroGLYCERIN (NITROSTAT) 0.4 MG SL tablet Place under the tongue. 06/26/13   [provider]  sildenafil (VIAGRA) 50 MG tablet Take 50 mg by mouth as needed for erectile dysfunction. 03/23/20   [provider]  triamcinolone ointment (KENALOG) 0.1 % Apply 1 application topically 2 (two) times daily as needed. Apply 1-2 times daily as needed for itch to affected areas. Avoid applying to face, groin, and axilla. Use as directed. Risk of skin atrophy with long-term use reviewed. 02/18/20   Moye, [place], MD    Allergies Ramipril  Family History  Problem Relation Age of Onset   Heart disease Mother    Hypertension Mother    Heart disease Father    Hypertension Father    Brain cancer Brother    Melanoma Brother     Social History Social History   Tobacco Use   Smoking status: Never Smoker   Smokeless tobacco: Never Used    Review of Systems  Constitutional: No fever/chills Eyes: No visual changes. ENT: No sore throat. Cardiovascular: Positive for chest pain.  Positive for lightheadedness. Respiratory: Positive for shortness of breath. Gastrointestinal: No abdominal pain.  No nausea, no vomiting.  No diarrhea.  No  constipation. Genitourinary: Negative for dysuria. Musculoskeletal: Negative for back pain. Skin: Negative for rash. Neurological: Negative for headaches, focal weakness or numbness.  ____________________________________________   PHYSICAL EXAM:  VITAL SIGNS: ED Triage Vitals [06/24/20 1146]  Enc Vitals Group     BP 126/71     Pulse Rate 76     Resp 16     Temp 98.1 F (36.7 C)     Temp Source Oral     SpO2 99 %     Weight 225 lb (102.1 kg)     Height 6\' 3"  (1.905 m)     Head Circumference      Peak Flow      Pain Score 0     Pain Loc      Pain Edu?      Excl. in [place]?     Constitutional: Alert and oriented. Eyes: Conjunctivae are normal. Head: Atraumatic. Nose: No congestion/rhinnorhea. Mouth/Throat: Mucous membranes are moist. Neck:  Normal ROM Cardiovascular: Normal rate, regular rhythm. Grossly normal heart sounds.  2+ radial pulses bilaterally. Respiratory: Normal respiratory effort.  No retractions. Lungs CTAB. Gastrointestinal: Soft and nontender. No distention. Genitourinary: deferred Musculoskeletal: No lower extremity tenderness nor edema. Neurologic:  Normal speech and language. No gross focal neurologic deficits are appreciated. Skin:  Skin is warm, dry and intact. No rash noted. Psychiatric: Mood and affect are normal. Speech and behavior are normal.  ____________________________________________   LABS (all labs ordered are listed, but only abnormal results are displayed)  Labs Reviewed  BASIC METABOLIC PANEL - Abnormal; Notable for the following components:      Result Value   Glucose, Bld 230 (*)    BUN 29 (*)    Creatinine, Ser 1.97 (*)    GFR, Estimated 34 (*)    All other components within normal limits  RESP PANEL BY RT-PCR (FLU A&B, COVID) ARPGX2  CBC  MAGNESIUM  TSH  TROPONIN I (HIGH SENSITIVITY)  TROPONIN I (HIGH SENSITIVITY)   ____________________________________________  EKG  ED ECG REPORT I, Blake Divine, the attending  physician, personally viewed and interpreted this ECG.   Date: 06/24/2020  EKG Time: 11:36  Rate: 98  Rhythm: Sinus rhythm with aberrancy versus slow ventricular tachycardia  Axis: Normal  Intervals:nonspecific intraventricular conduction delay  ST&T Change: ST changes with concordant ST elevation inferiorly  ED ECG REPORT I, Blake Divine, the attending physician, personally viewed and interpreted this ECG.   Date: 06/24/2020  EKG Time: 12:13  Rate: 76  Rhythm: normal sinus rhythm  Axis: Normal  Intervals:nonspecific intraventricular conduction delay  ST&T Change: None   PROCEDURES  Procedure(s) performed (including Critical Care):  Procedures   ____________________________________________   INITIAL IMPRESSION / ASSESSMENT AND PLAN / ED COURSE       80 year old male with past with history of hypertension, diabetes, paroxysmal A. fib on Eliquis, and recurrent V. tach status post ablation who presents to the ED complaining of intermittent near syncopal events associated with chest pressure and difficulty breathing.  Initial EKG shows wide-complex rhythm potentially consistent with left bundle branch block but with concordant ST changes concerning for STEMI.  Code STEMI was called but afterwards patient appeared to have a rhythm change, now clearly in normal sinus rhythm with improved with of QRS and no ischemic changes.  Improvement in rhythm appears to coincide with improvement in patient's symptoms.  Patient evaluated by [name] of cardiology, who states STEMI seems less likely and he would favor slow V. tach being seen on his initial EKG.  Patient remains in a normal rhythm at this time, but does have occasional runs of nonsustained V. tach, approximately 10-12 beats each time.  Lab work is unremarkable, 2 sets of troponin are negative, electrolytes within normal limits.  [name] discussed case with patient's cardiologist at [facility], [name], who accept patient for  transfer but unfortunately [facility] unlikely to have bed available in the next 24 hours.  Cardiology recommends maintaining patient's usual antiarrhythmic medications for now and case discussed with hospitalist for admission.      ____________________________________________   FINAL CLINICAL IMPRESSION(S) / ED DIAGNOSES  Final diagnoses:  Nonsustained ventricular tachycardia ([place])  Nonspecific chest pain     ED Discharge Orders    None       Note:  This document was prepared using Dragon voice recognition software and may include unintentional dictation errors.   Blake Divine, MD 06/24/20 1630

## 2020-06-24 NOTE — ED Notes (Signed)
Patient took home med: mexitil 200 mg

## 2020-06-24 NOTE — ED Notes (Signed)
Pt pulled for EKG. 

## 2020-06-24 NOTE — ED Notes (Signed)
Dr. Cox at bedside.  

## 2020-06-24 NOTE — ED Triage Notes (Signed)
Reports chest pressure this AM, feeling intermittently lightheaded. Denies chest pressure at this time.

## 2020-06-24 NOTE — ED Notes (Signed)
Cardiologist at bedside.  

## 2020-06-24 NOTE — Consult Note (Signed)
Cardiology Consultation:   Patient ID: Brandon Wall MRN: [id number]; DOB: 11-Oct-1940  Admit date: 06/24/2020 Date of Consult: 06/24/2020  Primary Care Provider: [name], MD [facility] Cardiologist: Dr. Shirley Friar at [facility] Electrophysiologist: Dr. Norm Salt at [facility]   Patient Profile:   Brandon Wall is a 80 y.o. male with a hx of (s/p CABG and multiple PCI, ischemic cardiomyopathy status post ICD placement,  atrial fibrillation, ventricular tachycardia, chronic kidney disease, hyperlipidemia, diabetes mellitus and sleep apnea who is being seen today for the evaluation of abnormal EKG and ventricular tachycardia at the request of Dr. Charna Archer.  History of Present Illness:   Mr. Brandon Wall is an 80 year old gentleman with extensive cardiac history that has been followed at [facility].  His cardiologist is [name].  He is status post remote CABG with multiple PCI's since then.  He had 2 recent admissions at [facility] for ventricular tachycardia.  The first was in October which resulted in an ICD shock.  He was started on mexiletine at that time.  The patient was rehospitalized again in November with recurrent slow nonsustained ventricular tachycardia.  He was started on lidocaine drip.  He continued to have recurrent events of slow ventricular tachycardia which were symptomatic by the patient.  He underwent cardiac catheterization during that admission which showed new occlusion of SVG to the PL system that was intervened on multiple times in the past.  LIMA to LAD was patent with stable disease in the left circumflex and RCA.  The patient was seen by electrophysiology.  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  He underwent an ablation procedure with partial success and was told that he might require a second procedure. The patient had COVID-19 infection 2 weeks ago and was treated with monoclonal antibodies.  He reports that his COVID symptoms were  overall mild.  However, over the last few days, he started experiencing episodes of dizziness and lightheadedness with a strange feeling in his chest.  This worsened today and he had a presyncopal episode.  It was associated with chest discomfort and thus he came to the ED for evaluation.  His initial EKG showed wide QRS rhythm with possible inferior ST elevation and thus a code STEMI was initially activated and that is when I got involved to see the patient.  Subsequent EKGs showed narrowing of the QRS and resolution of inferior ST elevation.  I reviewed the EKGs and I suspect that the patient was in slow ventricular tachycardia.  He is currently not having any chest pain.  However, on the monitor, he is noted to have recurrent runs of nonsustained ventricular tachycardia occasionally up to 20 beats.  He feels very funny and lightheaded during these episodes.  His troponin came back negative.  He reports compliance with mexiletine.   Past Medical History:  Diagnosis Date  . Actinic keratosis   . COVID-19 virus infection 05/2020  . Dysplastic nevus 08/16/2009   right mid back lat  . Heart disease   . Hx of basal cell carcinoma 11/11/2012   L superior lateral deltoid  . 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 Hx of squamous cell carcinoma 10/04/2014   Crown scalp, L sideburn  . Hypertension   . Morbid obesity ([place])   . PAF (paroxysmal atrial fibrillation) ([place])   . Squamous cell carcinoma of skin 01/12/2010   left dorsum hand  . Squamous cell carcinoma of skin  10/04/2014   crown scalp  . Squamous cell carcinoma of skin 07/14/2015   left sideburn  . Type II diabetes mellitus ([place])     Past Surgical History:  Procedure Laterality Date  . INSERT / REPLACE / REMOVE PACEMAKER  2010  . MELANOMA EXCISION Left 09/24/2012   spinal upper back     Home Medications:  Prior to Admission medications   Medication Sig Start Date End Date Taking? Authorizing Provider  apixaban  (ELIQUIS) 5 MG TABS tablet Take 5 mg by mouth 2 (two) times daily. 02/25/14  Yes [provider]  aspirin 81 MG EC tablet Take 81 mg by mouth daily.   Yes [provider]  fluticasone (FLONASE) 50 MCG/ACT nasal spray Place 2 sprays into the nose daily as needed for allergies or rhinitis.   Yes [provider]  furosemide (LASIX) 20 MG tablet Take 20 mg by mouth daily. 04/10/20  Yes [provider]  glipiZIDE (GLUCOTROL XL) 10 MG 24 hr tablet Take 10 mg by mouth daily. 05/01/20  Yes [provider]  JARDIANCE 25 MG TABS tablet Take 25 mg by mouth daily. 03/05/18  Yes [provider]  Melatonin 10 MG TABS Take 10 mg by mouth at bedtime.   Yes [provider]  metFORMIN (GLUCOPHAGE) 500 MG tablet Take 500 mg by mouth 2 (two) times daily. 06/16/20  Yes [provider]  mexiletine (MEXITIL) 200 MG capsule Take 200 mg by mouth 3 (three) times daily. 06/21/20  Yes [provider]  pramipexole (MIRAPEX) 0.25 MG tablet Take 0.25 mg by mouth at bedtime. 05/08/20  Yes [provider]  rosuvastatin (CRESTOR) 40 MG tablet Take 40 mg by mouth daily. 07/14/14 06/24/20 Yes [provider]  spironolactone (ALDACTONE) 25 MG tablet Take 12.5 mg by mouth daily. 02/01/18  Yes [provider]  glucose blood test strip  02/19/13   [provider]  metoprolol succinate (TOPROL-XL) 100 MG 24 hr tablet Take 50 mg by mouth.  10/27/14 03/10/18  [provider]  nitroGLYCERIN (NITROSTAT) 0.4 MG SL tablet Place under the tongue. 06/26/13   [provider]  sildenafil (VIAGRA) 50 MG tablet Take 50 mg by mouth as needed for erectile dysfunction. 03/23/20   [provider]  triamcinolone ointment (KENALOG) 0.1 % Apply 1 application topically 2 (two) times daily as needed. Apply 1-2 times daily as needed for itch to affected areas. Avoid applying to face, groin, and axilla. Use as directed. Risk of skin  atrophy with long-term use reviewed. 02/18/20   Moye, [place], MD    Inpatient Medications: Scheduled Meds:  Continuous Infusions:  PRN Meds:   Allergies:    Allergies  Allergen Reactions  . Ramipril     Other reaction(s): Cough    Social History:   Social History   Socioeconomic History  . Marital status: Married    Spouse name: Not on file  . Number of children: Not on file  . Years of education: Not on file  . Highest education level: Not on file  Occupational History  . Not on file  Tobacco Use  . Smoking status: Never Smoker  . Smokeless tobacco: Never Used  Substance and Sexual Activity  . Alcohol use: Not on file  . Drug use: Not on file  . Sexual activity: Not on file  Other Topics Concern  . Not on file  Social History Narrative  . Not on file   Social Determinants of Health   Financial  Resource Strain: Not on file  Food Insecurity: Not on file  Transportation Needs: Not on file  Physical Activity: Not on file  Stress: Not on file  Social Connections: Not on file  Intimate Partner Violence: Not on file    Family History:    Family History  Problem Relation Age of Onset  . Heart disease Mother   . Hypertension Mother   . Heart disease Father   . Hypertension Father   . Brain cancer Brother   . Melanoma Brother      ROS:  Please see the history of present illness.   All other ROS reviewed and negative.     Physical Exam/Data:   Vitals:   06/24/20 1400 06/24/20 1430 06/24/20 1500 06/24/20 1530  BP: 110/76 (!) 94/59 115/78 129/79  Pulse: 96 (!) 45 (!) 59 76  Resp: (!) 28 19 18 18   Temp:      TempSrc:      SpO2: 94% 97% 96% 98%  Weight:      Height:       No intake or output data in the 24 hours ending 06/24/20 1558 Last 3 Weights 06/24/2020 10/21/2018 07/08/2018  Weight (lbs) 225 lb 240 lb 241 lb  Weight (kg) 102.059 kg 108.863 kg 109.317 kg     Body mass index is 28.12 kg/m.  General:  Well nourished, well developed, in no  acute distress HEENT: normal Lymph: no adenopathy Neck: no JVD Endocrine:  No thryomegaly Vascular: No carotid bruits; FA pulses 2+ bilaterally without bruits  Cardiac:  normal S1, S2; RRR; no murmur  Lungs:  clear to auscultation bilaterally, no wheezing, rhonchi or rales  Abd: soft, nontender, no hepatomegaly  Ext: no edema Musculoskeletal:  No deformities, BUE and BLE strength normal and equal Skin: warm and dry  Neuro:  CNs 2-12 intact, no focal abnormalities noted Psych:  Normal affect   EKG:  The EKG was personally reviewed and demonstrates: Wide QRS rhythm likely due to ventricular tachycardia subsequently EKG showed sinus rhythm with evidence of prior inferior infarct.  No ST elevation. Telemetry:  Telemetry was personally reviewed and demonstrates: Recurrent episodes of nonsustained ventricular tachycardia.  Relevant CV Studies:   Laboratory Data:  High Sensitivity Troponin:   Recent Labs  Lab 06/24/20 1200 06/24/20 1415  TROPONINIHS 8 4     Chemistry Recent Labs  Lab 06/24/20 1200  NA 140  K 4.3  CL 103  CO2 23  GLUCOSE 230*  BUN 29*  CREATININE 1.97*  CALCIUM 9.6  GFRNONAA 34*  ANIONGAP 14    No results for input(s): PROT, ALBUMIN, AST, ALT, ALKPHOS, BILITOT in the last 168 hours. Hematology Recent Labs  Lab 06/24/20 1200  WBC 8.7  RBC 4.52  HGB 14.3  HCT 43.0  MCV 95.1  MCH 31.6  MCHC 33.3  RDW 13.1  PLT 190   BNPNo results for input(s): BNP, PROBNP in the last 168 hours.  DDimer No results for input(s): DDIMER in the last 168 hours.   Radiology/Studies:  DG Chest Portable 1 View  Result Date: 06/24/2020 CLINICAL DATA:  Chest pain and shortness of breath beginning this morning. EXAM: PORTABLE CHEST 1 VIEW COMPARISON:  09/10/2014 FINDINGS: The heart size and mediastinal contours are within normal limits. 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 Both lungs are clear. The visualized skeletal structures are unremarkable.  IMPRESSION: No active disease. Electronically Signed   By: Marlaine Hind M.D.   On: 06/24/2020 12:17  Assessment and Plan:   1. Recurrent nonsustained ventricular tachycardia: The patient seems to be highly symptomatic during these episodes.  He has been compliant with mexiletine.  Continue treatment with beta-blocker and mexiletine.  Current events do not seem to be driven by an ischemic cardiac event as his troponin remains normal x2.  I suspect that his ventricular tachycardia is related to his inferior myocardial scar.  Unfortunately, amiodarone was associated with liver toxicity in the past and thus this is not an option.  I discussed the case with the patient's cardiologist [name] at [facility] who accepted the patient for transfer as he requires electrophysiology consultation and possibly a repeat ablation procedure. 2. Coronary artery disease status post CABG, currently with no evidence of myocardial infarction.  Continue medical therapy. 3. Atrial fibrillation: The patient is on long-term anticoagulation with Eliquis which should be transitioned to heparin drip in case invasive procedure is needed on Monday. 4. Chronic systolic heart failure: He appears to be euvolemic.  Continue outpatient heart failure medications.  The patient is accepted for transfer to [facility] but might need to be observed here until a bed is available.  For questions or updates, please contact [place] Please consult [email or web address] for contact info under    Signed, Kathlyn [place], MD  06/24/2020 3:58 PM

## 2020-06-24 NOTE — H&P (Signed)
History and Physical   [name] CBJ:[id number] DOB: [birth year] DOA: 06/24/2020  PCP: [name], MD  Outpatient Specialists: [facility], [name] Patient coming from: Home via EMS  I have personally briefly reviewed patient's old medical records in [place].  Chief Concern: Chest pain  HPI: Brandon Wall is a 80 y.o. male with medical history significant for coronary artery disease status post CABG in 11/1991 with LIMA to LAD and vein graft to posterior lateral branch, status post 6 stents placed at [facility] and [facility], status post PCI of the RCA for in-stent restenosis at [facility] on 11/2009, cardiomyopathy status post dual-lead ICD placement, atrial fibrillation, hypertension, tested positive for COVID infection in 05/30/2020, history of ventricular tachycardia requiring external cardioversion, hyperlipidemia, non-insulin-dependent diabetes mellitus, osteoarthritis, presented to the emergency department for chief concerns of chest pain.  Patient reports that he developed feelings of fainting and trouble breathing and chest pain.  He endorses that the chest pain is substernal and minimal.  He reports these episodes last approximately 30 seconds to a minute before resolving on their own.  He denies radiation to his back, upper extremity, jaw, neck discomfort.  He endorses shortness of breath is worse with exertion.  He reports the aspirin given to him by EMS on route improved his symptoms.  Social history: Lives at home with his spouse.  He currently is retired and formerly worked as a Environmental education officer.  He denies tobacco, EtOH, and recreational drug use.  ROS: Constitutional: no weight change, no fever ENT/Mouth: no sore throat, no rhinorrhea Eyes: no eye pain, no vision changes Cardiovascular: + chest pain, + dyspnea,  no edema, no palpitations Respiratory: no cough, no sputum, no wheezing Gastrointestinal: no nausea, no vomiting, no diarrhea, no  constipation Genitourinary: no urinary incontinence, no dysuria, no hematuria Musculoskeletal: no arthralgias, no myalgias Skin: no skin lesions, no pruritus, Neuro: + weakness, no loss of consciousness, no syncope Psych: no anxiety, no depression, + decrease appetite Heme/Lymph: no bruising, no bleeding  ED Course: Discussed with ED provider, patient presented for chest pain via EMS, was given aspirin 324 on route.  Per ED notes, chest pain subsided initially in the ED. However STEMI was initiated at approximately 1152. Cardiologist, [name] was consulted by ED provider.  Patient had V. tach runs and then converted back to normal sinus rhythm several times.  Patient took his home dose of mexiletine 200 mg at approximately 1412, due to delayed administration of mexiletine inpatient as medications need to be place, approved by pharmacy, and pulled.  [name] states STEMI less likely and in favor of slow V. tach based on patient's EKG. [name] discussed case with patient's cardiologist at [facility], [name] who has accepted the patient for transfer but unfortunately [facility] does not have a bed available until the next 24 hours. Cardiologist recommends admitting patient and maintaining usual antiarrhythmic medications for now and admit to medicine service.  Assessment/Plan  Active Problems:   Chest pain   Chest pain and shortness of breath suspect secondary to recurrent nonsustained ventricular tachycardia - Cardiology has been consulted - [name] spoke with [name] at [facility] who has accepted the patient to their service pending bed - Cardiologist recommends continue current outpatient medication - Aspirin 81 mg, metoprolol 100 mg daily, mexiletine 200 mg p.o. 3 times daily, spironolactone 12.5 mg daily -N.p.o. at bedside  Atrial fibrillation-patient on long-term anticoagulation with Eliquis 2.5 twice daily, per cardiology we will transition to heparin gtt.  in anticipation of possible  invasive procedure once patient transferred to [facility] failure reduced ejection fraction - Echo on 03/12/2019 showed EF of 45% - Patient appears to be euvolemic at this time - Resumed home furosemide 20 mg daily  CAD s/p CABG and multiple PCI -resumed rosuvastatin 40 mg daily, aspirin 81 mg daily  Non-insulin-dependent diabetes mellitus-resumed home metformin 500 mg twice daily - Holding home glipizide - Insulin SSI with at bedtime coverage  Chart reviewed.  He is status post remote CABG multiple PCI.  He had recent admission to ventricular tachycardia. The first was in October 2021 which resulted in ICD shock.  He was started on mexiletine at that time.  Patient was rehospitalized again at [facility] in November 2021 with recurrent slow nonsustained ventricular tachycardia, he was started on lidocaine drip and he continued to have recurrent events of slow ventricular tachycardia which were symptomatic by the patient. He underwent cardiac catheterization during the admission which showed no occlusion of SVG to the PL system that was intervened on multiple times in the past.  LIMA to LAD was patent with stable disease in the left circumflex and RCA. The patient was seen by EP specialist. He had developed liver toxicity and amiodarone was not used.  He underwent ablation procedure with partial success and was told that he might require a second procedure.  He developed COVID infection about 2 weeks ago and was treated with monoclonal antibodies.  DVT prophylaxis: Heparin GTT Code Status: Full code Diet: N.p.o. Family Communication: Spouse at bedside has been updated Disposition Plan: Pending clinical course Consults called: Cardiology Admission status: Progressive cardiac   Past Medical History:  Diagnosis Date  . Actinic keratosis   . COVID-19 virus infection 05/2020  . Dysplastic nevus 08/16/2009   right mid back lat  . Heart disease   . Hx of basal cell carcinoma 11/11/2012   L superior  lateral deltoid  . 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 Hx of squamous cell carcinoma 10/04/2014   Crown scalp, L sideburn  . Hypertension   . Morbid obesity ([place])   . PAF (paroxysmal atrial fibrillation) ([place])   . Squamous cell carcinoma of skin 01/12/2010   left dorsum hand  . Squamous cell carcinoma of skin 10/04/2014   crown scalp  . Squamous cell carcinoma of skin 07/14/2015   left sideburn  . Type II diabetes mellitus ([place])    Past Surgical History:  Procedure Laterality Date  . INSERT / REPLACE / REMOVE PACEMAKER  2010  . MELANOMA EXCISION Left 09/24/2012   spinal upper back   Social History:  reports that he has never smoked. He has never used smokeless tobacco. No history on file for alcohol use and drug use.  Allergies  Allergen Reactions  . Ramipril     Other reaction(s): Cough   Family History  Problem Relation Age of Onset  . Heart disease Mother   . Hypertension Mother   . Heart disease Father   . Hypertension Father   . Brain cancer Brother   . Melanoma Brother    Family history: Family history reviewed and not pertinent  Prior to Admission medications   Medication Sig Start Date End Date Taking? Authorizing Provider  apixaban (ELIQUIS) 5 MG TABS tablet Take 5 mg by mouth 2 (two) times daily. 02/25/14  Yes [provider]  aspirin 81 MG EC tablet Take 81 mg by mouth daily.   Yes [provider]  fluticasone (FLONASE) 50 MCG/ACT nasal spray Place 2 sprays into the nose daily as needed for allergies or rhinitis.   Yes [provider]  furosemide (LASIX) 20 MG tablet Take 20 mg by mouth daily. 04/10/20  Yes [provider]  glipiZIDE (GLUCOTROL XL) 10 MG 24 hr tablet Take 10 mg by mouth daily. 05/01/20  Yes [provider]  JARDIANCE 25 MG TABS tablet Take 25 mg by mouth daily. 03/05/18  Yes [provider]  Melatonin 10 MG TABS Take 10 mg by mouth at bedtime.   Yes [provider]  metFORMIN (GLUCOPHAGE) 500 MG tablet Take 500 mg by mouth 2 (two) times daily. 06/16/20  Yes [provider]  mexiletine (MEXITIL) 200 MG capsule Take 200 mg by mouth 3 (three) times daily. 06/21/20  Yes [provider]  pramipexole (MIRAPEX) 0.25 MG tablet Take 0.25 mg by mouth at bedtime. 05/08/20  Yes [provider]  rosuvastatin (CRESTOR) 40 MG tablet Take 40 mg by mouth daily. 07/14/14 06/24/20 Yes [provider]  spironolactone (ALDACTONE) 25 MG tablet Take 12.5 mg by mouth daily. 02/01/18  Yes [provider]  glucose blood test strip  02/19/13   [provider]  metoprolol succinate (TOPROL-XL) 100 MG 24 hr tablet Take 50 mg by mouth.  10/27/14 03/10/18  [provider]  nitroGLYCERIN (NITROSTAT) 0.4 MG SL tablet Place under the tongue. 06/26/13   [provider]  sildenafil (VIAGRA) 50 MG tablet Take 50 mg by mouth as needed for erectile dysfunction. 03/23/20   [provider]  triamcinolone ointment (KENALOG) 0.1 % Apply 1 application topically 2 (two) times daily as needed. Apply 1-2 times daily as needed for itch to affected areas. Avoid applying to face, groin, and axilla. Use as directed. Risk of skin atrophy with long-term use reviewed. 02/18/20   Moye, [place], MD   Physical Exam: Vitals:   06/24/20 1400 06/24/20 1430 06/24/20 1500 06/24/20 1530  BP: 110/76 (!) 94/59 115/78 129/79  Pulse: 96 (!) 45 (!) 59 76  Resp: (!) 28 19 18 18   Temp:      TempSrc:      SpO2: 94% 97% 96% 98%  Weight:      Height:       Constitutional: appears age-appropriate, NAD, calm, comfortable Eyes: PERRL, lids and conjunctivae normal ENMT: Mucous membranes are moist. Posterior pharynx clear of any exudate or lesions. Age-appropriate dentition.  Mild hearing loss Neck: normal, supple, no masses, no thyromegaly Respiratory: clear to auscultation bilaterally, no wheezing, no crackles. Normal respiratory effort. No  accessory muscle use.  Cardiovascular: Regular rate and rhythm, no murmurs / rubs / gallops. No extremity edema. 2+ pedal pulses. No carotid bruits.  Abdomen: Obese abdomen, no tenderness, no masses palpated, no hepatosplenomegaly. Bowel sounds positive.  Musculoskeletal: no clubbing / cyanosis. No joint deformity upper and lower extremities. Good ROM, no contractures, no atrophy. Normal muscle tone.  Skin: no rashes, lesions, ulcers. No induration Neurologic: Sensation intact. Strength 5/5 in all 4.  Psychiatric: Normal judgment and insight. Alert and oriented x 3. Normal mood.   EKG: independently reviewed, showing sinus rhythm rate of 76, QTc 438  Chest x-ray on Admission: I personally reviewed and I agree with radiologist reading as below.  DG Chest Portable 1 View  Result Date: 06/24/2020 CLINICAL DATA:  Chest pain and shortness of breath beginning this morning. EXAM: PORTABLE CHEST 1 VIEW COMPARISON:  09/10/2014 FINDINGS: The heart size and mediastinal contours  are within normal limits. 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 Both lungs are clear. The visualized skeletal structures are unremarkable. IMPRESSION: No active disease. Electronically Signed   By: Marlaine Hind M.D.   On: 06/24/2020 12:17    Labs on Admission: I have personally reviewed following labs  CBC: Recent Labs  Lab 06/24/20 1200  WBC 8.7  HGB 14.3  HCT 43.0  MCV 95.1  PLT 240   Basic Metabolic Panel: Recent Labs  Lab 06/24/20 1200 06/24/20 1210  NA 140  --   K 4.3  --   CL 103  --   CO2 23  --   GLUCOSE 230*  --   BUN 29*  --   CREATININE 1.97*  --   CALCIUM 9.6  --   MG  --  2.0   Adanya Sosinski N Jlyn Cerros D.O. [facility]  If 7PM-7AM, please contact overnight-coverage provider If 7AM-7PM, please contact day coverage provider [email or web address]  06/24/2020, 3:57 PM

## 2020-06-24 NOTE — Progress Notes (Signed)
   06/24/20 1155  Clinical Encounter Type  Visited With Patient  Visit Type Initial;Spiritual support;Social support  Referral From Nurse  Consult/Referral To Chaplain  Ch went to Pt room. Pt was not there. Ch will follow up later.

## 2020-06-24 NOTE — ED Notes (Signed)
Patient reports intermittent episodes of shortness of breath and dizziness. Patients HR and rhythm changing every 1-2 min, several EKGs obtained to show changed, cardiology and ED MD aware.

## 2020-06-24 NOTE — ED Notes (Signed)
Called [facility] initiated stemi with Phil  [phone]

## 2020-06-24 NOTE — ED Notes (Signed)
Patient and wife provided with additional warm blankets and pillow. Patient placed his home CPAP at this time. Patient updated on plan of care. Lights dimmed. Wife at bedside in [place]. Patient remains on cardiac monitoring and heparin gtt. Patient states he hasnt felt any episodes of heart irregularity this evening. Denies chest pain.

## 2020-06-24 NOTE — ED Notes (Signed)
Dr Jessup at bedside 

## 2020-06-24 NOTE — ED Notes (Signed)
Patient had run of vtach, back in NSR. Strip printed and given to Dr. Charna Archer

## 2020-06-24 NOTE — ED Notes (Signed)
Another run of vtach noted on monitor, MD aware.

## 2020-06-24 NOTE — ED Notes (Signed)
Per Dr. Caryl Comes patient does not need to be NPO, order changed.

## 2020-06-24 NOTE — ED Notes (Signed)
Patient had run of vtach, back in NSR. Strip printed and given to Dr. Jessup 

## 2020-06-24 NOTE — Consult Note (Signed)
ANTICOAGULATION CONSULT NOTE  Pharmacy Consult for Heparin Infusion Indication: atrial fibrillation  Patient Measurements: Heparin Dosing Weight: 102.1 kg  Labs: Recent Labs    06/24/20 1200 06/24/20 1415  HGB 14.3  --   HCT 43.0  --   PLT 190  --   CREATININE 1.97*  --   TROPONINIHS 8 4    Estimated Creatinine Clearance: 38.7 mL/min (A) (by C-G formula based on SCr of 1.97 mg/dL (H)).   Medical History: Past Medical History:  Diagnosis Date  . Actinic keratosis   . COVID-19 virus infection 05/2020  . Dysplastic nevus 08/16/2009   right mid back lat  . Heart disease   . Hx of basal cell carcinoma 11/11/2012   L superior lateral deltoid  . 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 Hx of squamous cell carcinoma 10/04/2014   Crown scalp, L sideburn  . Hypertension   . Morbid obesity ([place])   . PAF (paroxysmal atrial fibrillation) ([place])   . Squamous cell carcinoma of skin 01/12/2010   left dorsum hand  . Squamous cell carcinoma of skin 10/04/2014   crown scalp  . Squamous cell carcinoma of skin 07/14/2015   left sideburn  . Type II diabetes mellitus (H[facility])     Medications:  Apixaban 2.5 mg BID (last patient reported dose 06/24/20 at [place])  Assessment: Patient is an 80 y/o M with medical history as above and including atrial fibrillation on apixaban who presented to the ED 1/14 with chest pain. Patient has been accepted for transfer to [facility]. Apixaban in anticipation of possible invasive procedure. Pharmacy has been consulted to initiate heparin infusion for atrial fibrillation while apixaban on hold.   Baseline CBC within normal limits. Baseline aPTT, PT-INR, and heparin level pending.   Goal of Therapy:  aPTT 66 - 102 seconds Monitor platelets by anticoagulation protocol: Yes   Plan:  --Start heparin infusion at 1400 units/hr with no bolus at 2000 --Check aPTT 8 hours after initiation of infusion. Anticipate elevated baseline heparin level  given apixaban interference. Once heparin level and aPTT correlate can switch to heparin level monitoring --Daily CBC per protocol while on heparin infusion  Benita Gutter 06/24/2020,4:47 PM

## 2020-06-24 NOTE — ED Notes (Signed)
First Nurse Note: Pt to ED via EMS from home for chest pain and shortness of breath. Pain and shob has subsided at this time. 324 mg ASA given by EMS. Pt does have pacemaker. BP-146/94

## 2020-06-25 DIAGNOSIS — U071 COVID-19: Secondary | ICD-10-CM | POA: Diagnosis not present

## 2020-06-25 DIAGNOSIS — I480 Paroxysmal atrial fibrillation: Secondary | ICD-10-CM

## 2020-06-25 DIAGNOSIS — I2510 Atherosclerotic heart disease of native coronary artery without angina pectoris: Secondary | ICD-10-CM | POA: Insufficient documentation

## 2020-06-25 DIAGNOSIS — I4729 Other ventricular tachycardia: Secondary | ICD-10-CM | POA: Insufficient documentation

## 2020-06-25 DIAGNOSIS — I472 Ventricular tachycardia: Secondary | ICD-10-CM | POA: Diagnosis not present

## 2020-06-25 LAB — HEPARIN LEVEL (UNFRACTIONATED)
Heparin Unfractionated: 2.02 IU/mL — ABNORMAL HIGH (ref 0.30–0.70)
Heparin Unfractionated: 2.76 IU/mL — ABNORMAL HIGH (ref 0.30–0.70)

## 2020-06-25 LAB — CBC
HCT: 39.3 % (ref 39.0–52.0)
Hemoglobin: 13.5 g/dL (ref 13.0–17.0)
MCH: 32 pg (ref 26.0–34.0)
MCHC: 34.4 g/dL (ref 30.0–36.0)
MCV: 93.1 fL (ref 80.0–100.0)
Platelets: 152 10*3/uL (ref 150–400)
RBC: 4.22 MIL/uL (ref 4.22–5.81)
RDW: 13 % (ref 11.5–15.5)
WBC: 5.8 10*3/uL (ref 4.0–10.5)
nRBC: 0 % (ref 0.0–0.2)

## 2020-06-25 LAB — HEMOGLOBIN A1C
Hgb A1c MFr Bld: 7.7 % — ABNORMAL HIGH (ref 4.8–5.6)
Mean Plasma Glucose: 174.29 mg/dL

## 2020-06-25 LAB — APTT
aPTT: 155 seconds — ABNORMAL HIGH (ref 24–36)
aPTT: >160 seconds (ref 24–36)

## 2020-06-25 LAB — CBG MONITORING, ED: Glucose-Capillary: 90 mg/dL (ref 70–99)

## 2020-06-25 LAB — BASIC METABOLIC PANEL
Anion gap: 11 (ref 5–15)
BUN: 29 mg/dL — ABNORMAL HIGH (ref 8–23)
CO2: 25 mmol/L (ref 22–32)
Calcium: 9 mg/dL (ref 8.9–10.3)
Chloride: 102 mmol/L (ref 98–111)
Creatinine, Ser: 1.63 mg/dL — ABNORMAL HIGH (ref 0.61–1.24)
GFR, Estimated: 42 mL/min — ABNORMAL LOW (ref >60)
Glucose, Bld: 113 mg/dL — ABNORMAL HIGH (ref 70–99)
Potassium: 3.8 mmol/L (ref 3.5–5.1)
Sodium: 138 mmol/L (ref 135–145)

## 2020-06-25 LAB — PROTIME-INR
INR: 1.1 (ref 0.8–1.2)
Prothrombin Time: 14.2 seconds (ref 11.4–15.2)

## 2020-06-25 LAB — FERRITIN: Ferritin: 45 ng/mL (ref 24–336)

## 2020-06-25 LAB — FIBRIN DERIVATIVES D-DIMER (ARMC ONLY): Fibrin derivatives D-dimer (ARMC): 469.85 ng/mL (FEU) (ref 0.00–499.00)

## 2020-06-25 LAB — BRAIN NATRIURETIC PEPTIDE: B Natriuretic Peptide: 162.2 pg/mL — ABNORMAL HIGH (ref 0.0–100.0)

## 2020-06-25 LAB — FIBRINOGEN: Fibrinogen: 350 mg/dL (ref 210–475)

## 2020-06-25 LAB — PROCALCITONIN: Procalcitonin: <0.1 ng/mL

## 2020-06-25 LAB — C-REACTIVE PROTEIN: CRP: <0.5 mg/dL (ref <1.0)

## 2020-06-25 MED ORDER — HEPARIN (PORCINE) 25000 UT/250ML-% IV SOLN: 1100.0000 [IU]/h | INTRAVENOUS | Status: DC

## 2020-06-25 MED ORDER — SODIUM CHLORIDE 0.9 % IV SOLN: 100.0000 mg | Freq: Every day | INTRAVENOUS | Status: DC

## 2020-06-25 MED ORDER — SODIUM CHLORIDE 0.9 % IV SOLN
200.0000 mg | Freq: Once | INTRAVENOUS | Status: DC
  Filled 2020-06-25: qty 40

## 2020-06-25 MED ORDER — ASCORBIC ACID 500 MG PO TABS
500.0000 mg | ORAL_TABLET | Freq: Every day | ORAL | Status: DC
  Administered 2020-06-25: 500 mg via ORAL
  Filled 2020-06-25: qty 1

## 2020-06-25 MED ORDER — IPRATROPIUM-ALBUTEROL 20-100 MCG/ACT IN AERS
1.0000 | INHALATION_SPRAY | Freq: Four times a day (QID) | RESPIRATORY_TRACT | Status: DC
  Filled 2020-06-25: qty 4

## 2020-06-25 MED ORDER — ZINC SULFATE 220 (50 ZN) MG PO CAPS
220.0000 mg | ORAL_CAPSULE | Freq: Every day | ORAL | Status: DC
  Administered 2020-06-25: 220 mg via ORAL
  Filled 2020-06-25: qty 1

## 2020-06-25 MED ORDER — HYDROCOD POLST-CPM POLST ER 10-8 MG/5ML PO SUER: 5.0000 mL | Freq: Two times a day (BID) | ORAL | Status: DC | PRN

## 2020-06-25 MED ORDER — LIDOCAINE IN D5W 4-5 MG/ML-% IV SOLN
1.0000 mg/min | INTRAVENOUS | Status: DC
  Administered 2020-06-25: 1 mg/min via INTRAVENOUS
  Filled 2020-06-25: qty 500

## 2020-06-25 MED ORDER — LIDOCAINE BOLUS VIA INFUSION
75.0000 mg | Freq: Once | INTRAVENOUS | Status: AC
  Administered 2020-06-25: 75 mg via INTRAVENOUS
  Filled 2020-06-25: qty 76

## 2020-06-25 MED ORDER — HEPARIN (PORCINE) 25000 UT/250ML-% IV SOLN
1200.0000 [IU]/h | INTRAVENOUS | Status: DC
  Administered 2020-06-25: 1200 [IU]/h via INTRAVENOUS
  Filled 2020-06-25: qty 250

## 2020-06-25 MED ORDER — APIXABAN 2.5 MG PO TABS
2.5000 mg | ORAL_TABLET | Freq: Two times a day (BID) | ORAL | Status: DC
  Filled 2020-06-25 (×2): qty 1

## 2020-06-25 MED ORDER — GUAIFENESIN-DM 100-10 MG/5ML PO SYRP: 10.0000 mL | ORAL_SOLUTION | ORAL | Status: DC | PRN

## 2020-06-25 NOTE — ED Notes (Addendum)
Spoke to [facility] - pt will be going to [facility], room [phone]. Accepting physician [name]. Probably a few hrs before truck is available per coordinator.

## 2020-06-25 NOTE — ED Notes (Signed)
Pt just had 21-beat run of v-tach. On assessment, pt asymptomatic, no cp or sob reported. Self-resolved, pt did not feel shock by ICD

## 2020-06-25 NOTE — ED Notes (Signed)
40-beat run of vtach on monitor, pt remains asymptomatic. Lidocaine gtt to be started per cardiology

## 2020-06-25 NOTE — ED Notes (Signed)
Pt w/another 17-beat run of vtach, remains asymptomatic at this time. Still awaiting on bed at [facility] for transfer

## 2020-06-25 NOTE — ED Notes (Signed)
  Patient had another 30 second run of V tach.  Went back to assess patient and he was still asymptomatic.  HR back down to 65 bpm.

## 2020-06-25 NOTE — Consult Note (Signed)
ANTICOAGULATION CONSULT NOTE  Pharmacy Consult for Heparin Infusion Indication: atrial fibrillation  Patient Measurements: Heparin Dosing Weight: 102.1 kg  Labs: Recent Labs    06/24/20 1200 06/24/20 1415 06/25/20 0608  HGB 14.3  --  13.5  HCT 43.0  --  39.3  PLT 190  --  152  APTT  --   --  155*  HEPARINUNFRC  --   --  2.02*  CREATININE 1.97*  --  1.63*  TROPONINIHS 8 4  --     Estimated Creatinine Clearance: 46.8 mL/min (A) (by C-G formula based on SCr of 1.63 mg/dL (H)).   Medical History: Past Medical History:  Diagnosis Date  . Actinic keratosis   . COVID-19 virus infection 05/2020  . Dysplastic nevus 08/16/2009   right mid back lat  . Heart disease   . Hx of basal cell carcinoma 11/11/2012   L superior lateral deltoid  . 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 Hx of squamous cell carcinoma 10/04/2014   Crown scalp, L sideburn  . Hypertension   . Morbid obesity ([place])   . PAF (paroxysmal atrial fibrillation) ([place])   . Squamous cell carcinoma of skin 01/12/2010   left dorsum hand  . Squamous cell carcinoma of skin 10/04/2014   crown scalp  . Squamous cell carcinoma of skin 07/14/2015   left sideburn  . Type II diabetes mellitus (H[facility])    Medications:  Apixaban 2.5 mg BID (last patient reported dose 06/24/20 at [place])  Assessment: Patient is an 80 y/o M with medical history as above and including atrial fibrillation on apixaban who presented to the ED 1/14 with chest pain. Patient has been accepted for transfer to [facility]. Apixaban held in anticipation of possible invasive procedure. Pharmacy has been consulted to initiate heparin infusion for atrial fibrillation while apixaban on hold.   Baseline CBC within normal limits. Baseline aPTT elevated - suspect lab draw error. HL 2.02 due to apixaban prior to admission. Baseline PT-INR pending.   plt 198>152, hgb 14.3>13.5  0115 0608 aPTT 155 (heparin level 2.02 elevated), SUPRAtherapeutic. -  suspect error when lab drawn. Heparin held x 30 minutes then resume at reduced rate of 1100 units/hr.   Goal of Therapy:  aPTT 66 - 102 seconds Monitor platelets by anticoagulation protocol: Yes   Plan:  --Pt transitioned to oral apixaban - now planning transfer to [facility] for ablation. Prior heparin gtt stopped at 1344. Will start heparin infusion at 1200 units/hr with no bolus since heparin gtt stopped for less than an hour prior to restarting.  --HL and aPTT redrawn due to suspected lab error - Anticipate elevated baseline heparin level given apixaban interference. Check aPTT 8 hours after initiation of infusion. Plan to monitor and adjust via aPTT level until heparin level and aPTT correlate, then can switch to heparin level monitoring.  --Daily CBC per protocol while on heparin infusion. Daily HL until HL and aPTT level correlate.   Benn Moulder, PharmD Pharmacy Resident  06/25/2020 2:33 PM

## 2020-06-25 NOTE — ED Notes (Signed)
Pt had 35-beat run of v-tach, remains asymptomatic, no cp

## 2020-06-25 NOTE — ED Notes (Signed)
Report called to Marikay Alar at [facility]

## 2020-06-25 NOTE — ED Provider Notes (Signed)
-----------------------------------------   11:48 AM on 06/25/2020 -----------------------------------------  Case discussed with [name] of cardiology at [facility] via at the [facility] transfer center.  Patient continues to have nonsustained runs of ventricular tachycardia necessitating transfer to [facility] for electrophysiology assessment and management.  Bed is now available for the patient at [facility] and we will begin the process of transfer.   Blake Divine, MD 06/25/20 1149

## 2020-06-25 NOTE — Progress Notes (Addendum)
Progress Note  Patient Name: Brandon Wall Date of Encounter: 06/25/2020  [place] Cardiologist: [facility]  Subjective   Doing okay, had runs of nonsustained VT earlier this morning 30 beats, then another 23 beats.  Clinically asymptomatic.  Apparently patient has a bed at [facility] and is waiting for transfer.  EP procedure/ablation is being planned, unsure when.  Wife at bedside.  His mexiletine was recently increased to 200 mg 3 times daily within the past week.  Inpatient Medications    Scheduled Meds: . vitamin C  500 mg Oral Daily  . aspirin EC  81 mg Oral Daily  . furosemide  20 mg Oral Daily  . insulin aspart  0-5 Units Subcutaneous QHS  . insulin aspart  0-9 Units Subcutaneous TID WC  . Ipratropium-Albuterol  1 puff Inhalation Q6H  . metFORMIN  500 mg Oral BID WC  . metoprolol succinate  100 mg Oral Daily  . mexiletine  200 mg Oral TID  . pramipexole  0.25 mg Oral QHS  . rosuvastatin  40 mg Oral Daily  . spironolactone  12.5 mg Oral Daily  . zinc sulfate  220 mg Oral Daily   Continuous Infusions: . heparin    . lidocaine 1 mg/min (06/25/20 1353)   PRN Meds: acetaminophen **OR** acetaminophen, chlorpheniramine-HYDROcodone, fluticasone, guaiFENesin-dextromethorphan, melatonin, nitroGLYCERIN, ondansetron **OR** ondansetron (ZOFRAN) IV   Vital Signs    Vitals:   06/25/20 1200 06/25/20 1230 06/25/20 1300 06/25/20 1330  BP: (!) 149/92 117/67 (!) 142/89 (!) 142/89  Pulse: 96 64 65 97  Resp:    14  Temp:      TempSrc:      SpO2: 99% 98% 98% 97%  Weight:      Height:        Intake/Output Summary (Last 24 hours) at 06/25/2020 1413 Last data filed at 06/25/2020 0730 Gross per 24 hour  Intake 151.4 ml  Output 300 ml  Net -148.6 ml   Last 3 Weights 06/24/2020 10/21/2018 07/08/2018  Weight (lbs) 225 lb 240 lb 241 lb  Weight (kg) 102.059 kg 108.863 kg 109.317 kg      Telemetry    Sinus rhythm, nonsustained VT lasting 30 beats, and 23 beats noted on  monitor- Personally Reviewed  ECG    No new tracing obtained- Personally Reviewed  Physical Exam   GEN: No acute distress.   Neck: No JVD Cardiac: RRR, no murmurs, rubs, or gallops.  Respiratory: Clear to auscultation bilaterally. GI: Soft, nontender, non-distended  MS: No edema; No deformity. Neuro:  Nonfocal  Psych: Normal affect   Labs    High Sensitivity Troponin:   Recent Labs  Lab 06/24/20 1200 06/24/20 1415  TROPONINIHS 8 4      Chemistry Recent Labs  Lab 06/24/20 1200 06/25/20 0608  NA 140 138  K 4.3 3.8  CL 103 102  CO2 23 25  GLUCOSE 230* 113*  BUN 29* 29*  CREATININE 1.97* 1.63*  CALCIUM 9.6 9.0  GFRNONAA 34* 42*  ANIONGAP 14 11     Hematology Recent Labs  Lab 06/24/20 1200 06/25/20 0608  WBC 8.7 5.8  RBC 4.52 4.22  HGB 14.3 13.5  HCT 43.0 39.3  MCV 95.1 93.1  MCH 31.6 32.0  MCHC 33.3 34.4  RDW 13.1 13.0  PLT 190 152    BNPNo results for input(s): BNP, PROBNP in the last 168 hours.   DDimer No results for input(s): DDIMER in the last 168 hours.   Radiology  DG Chest Portable 1 View  Result Date: 06/24/2020 CLINICAL DATA:  Chest pain and shortness of breath beginning this morning. EXAM: PORTABLE CHEST 1 VIEW COMPARISON:  09/10/2014 FINDINGS: The heart size and mediastinal contours are within normal limits. 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 Both lungs are clear. The visualized skeletal structures are unremarkable. IMPRESSION: No active disease. Electronically Signed   By: Marlaine Hind M.D.   On: 06/24/2020 12:17    Cardiac Studies   Cardiac MRI 04/2020 ([facility]) 1. The left ventricle is normal in size. Global systolic function is moderate-to-severely reduced with LVEF  of 35% (frequent PVCs). There is thinning and severe hypokinesia/akinesia in the inferior wall and  inferolateral wall. There is paradoxical septal motion as seen in patients s/p CABG and/or RV pacing. No  significant change in LV size and  function c/w prior exam.   2. The right ventricle is normal in size and systolic function.   3. The left atrium is mildly enlarged, the right atrium is normal in size.   4. There is mild-moderate aortic insufficiency, and mild mitral regurgitation seen.   5.Delayed enhancement imaging for viability is abnormal. There is hyperenhancement in the inferior and  inferolateral walls consistent with myocardial infarction in the dominant RCA-distribution. The infarct is  subendocardial at the basal segments, and becomes more transmural at the mid-ventricular level. No change  c/w prior exam, of note limited evaluation of apical anterior wall due to metal artifact.    Patient Profile     80 y.o. 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 fib, VT, sleep apnea, CKD 3 presenting with dizziness, found to have runs of ventricular tachycardia.  Assessment & Plan    1. 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 -CMR 04/2020 EF 35% -Continue mexiletine 200 mg 3 times daily as this was recently increased. -Low threshold to start lidocaine drip if VT recurs. -Discussed with nursing staff.   -Has a bed at [facility], awaiting transfer for likely ablation procedure.  Continue heparin, hold Eliquis in case procedure is done within the next 1 to 2 days.  2.  Ischemic cardiomyopathy, EF 35% s/p AICD -Appears euvolemic -Toprol-XL, Aldactone.  3.  CAD/CABG -Crestor, Toprol-XL.   -Was on PTA Eliquis in place of aspirin.  Getting heparin in case of procedure.  4.  Paroxysmal A. Fib -Currently in sinus.  Heparin as above.   Per reports, amiodarone did cause toxicity in the past.  Total encounter time 35 minutes  Greater than 50% was spent in counseling and coordination of care with the patient   Signed, Kate Sable, MD  06/25/2020, 2:13 PM

## 2020-06-25 NOTE — Consult Note (Signed)
ANTICOAGULATION CONSULT NOTE  Pharmacy Consult for Heparin Infusion Indication: atrial fibrillation  Patient Measurements: Heparin Dosing Weight: 102.1 kg  Labs: Recent Labs    06/24/20 1200 06/24/20 1415 06/25/20 0608  HGB 14.3  --  13.5  HCT 43.0  --  39.3  PLT 190  --  152  APTT  --   --  155*  HEPARINUNFRC  --   --  2.02*  CREATININE 1.97*  --  1.63*  TROPONINIHS 8 4  --     Estimated Creatinine Clearance: 46.8 mL/min (A) (by C-G formula based on SCr of 1.63 mg/dL (H)).   Medical History: Past Medical History:  Diagnosis Date  . Actinic keratosis   . COVID-19 virus infection 05/2020  . Dysplastic nevus 08/16/2009   right mid back lat  . Heart disease   . Hx of basal cell carcinoma 11/11/2012   L superior lateral deltoid  . 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 Hx of squamous cell carcinoma 10/04/2014   Crown scalp, L sideburn  . Hypertension   . Morbid obesity ([place])   . PAF (paroxysmal atrial fibrillation) ([place])   . Squamous cell carcinoma of skin 01/12/2010   left dorsum hand  . Squamous cell carcinoma of skin 10/04/2014   crown scalp  . Squamous cell carcinoma of skin 07/14/2015   left sideburn  . Type II diabetes mellitus (HCC)    Medications:  Apixaban 2.5 mg BID (last patient reported dose 06/24/20 at [place])  Assessment: Patient is an 80 y/o M with medical history as above and including atrial fibrillation on apixaban who presented to the ED 1/14 with chest pain. Patient has been accepted for transfer to [facility]. Apixaban in anticipation of possible invasive procedure. Pharmacy has been consulted to initiate heparin infusion for atrial fibrillation while apixaban on hold.   Baseline CBC within normal limits. Baseline aPTT, PT-INR, and heparin level pending.   Goal of Therapy:  aPTT 66 - 102 seconds Monitor platelets by anticoagulation protocol: Yes   Plan:  --Start heparin infusion at 1400 units/hr with no bolus at 2000 --Check  aPTT 8 hours after initiation of infusion. Anticipate elevated baseline heparin level given apixaban interference. Once heparin level and aPTT correlate can switch to heparin level monitoring --Daily CBC per protocol while on heparin infusion   0115 0608 aPTT 155 (heparin level 2.02 elevated), SUPRAtherapeutic.  Will hold Heparin x 30 minutes then resume at reduced rate of 1100 units/hr.  Recheck aPTT ~8 hours after heparin resumed.  CBC stable.  F/U Heparin level.  Allea Kassner A 06/25/2020,8:18 AM

## 2020-06-25 NOTE — Consult Note (Signed)
ANTICOAGULATION CONSULT NOTE  Pharmacy Consult for Heparin Infusion Indication: atrial fibrillation  Patient Measurements: Heparin Dosing Weight: 102.1 kg  Labs: Recent Labs    06/24/20 1200 06/24/20 1415 06/25/20 0608  HGB 14.3  --  13.5  HCT 43.0  --  39.3  PLT 190  --  152  APTT  --   --  155*  CREATININE 1.97*  --  1.63*  TROPONINIHS 8 4  --     Estimated Creatinine Clearance: 46.8 mL/min (A) (by C-G formula based on SCr of 1.63 mg/dL (H)).   Medical History: Past Medical History:  Diagnosis Date  . Actinic keratosis   . COVID-19 virus infection 05/2020  . Dysplastic nevus 08/16/2009   right mid back lat  . Heart disease   . Hx of basal cell carcinoma 11/11/2012   L superior lateral deltoid  . 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 Hx of squamous cell carcinoma 10/04/2014   Crown scalp, L sideburn  . Hypertension   . Morbid obesity ([place])   . PAF (paroxysmal atrial fibrillation) ([place])   . Squamous cell carcinoma of skin 01/12/2010   left dorsum hand  . Squamous cell carcinoma of skin 10/04/2014   crown scalp  . Squamous cell carcinoma of skin 07/14/2015   left sideburn  . Type II diabetes mellitus (HCC)    Medications:  Apixaban 2.5 mg BID (last patient reported dose 06/24/20 at [place])  Assessment: Patient is an 80 y/o M with medical history as above and including atrial fibrillation on apixaban who presented to the ED 1/14 with chest pain. Patient has been accepted for transfer to [facility]. Apixaban in anticipation of possible invasive procedure. Pharmacy has been consulted to initiate heparin infusion for atrial fibrillation while apixaban on hold.   Baseline CBC within normal limits. Baseline aPTT, PT-INR, and heparin level pending.   Goal of Therapy:  aPTT 66 - 102 seconds Monitor platelets by anticoagulation protocol: Yes   Plan:  --Start heparin infusion at 1400 units/hr with no bolus at 2000 --Check aPTT 8 hours after initiation  of infusion. Anticipate elevated baseline heparin level given apixaban interference. Once heparin level and aPTT correlate can switch to heparin level monitoring --Daily CBC per protocol while on heparin infusion   0115 0608 aPTT 155 (heparin level pending), SUPRAtherapeutic.  Will hold Heparin x 30 minutes then resume at reduced rate of 1100 units/hr.  Recheck aPTT ~8 hours after heparin resumed.  CBC stable.  F/U Heparin level.  Hart Robinsons A 06/25/2020,7:05 AM

## 2020-06-25 NOTE — ED Notes (Signed)
  Patient has had several more runs of V tach, all lasting around 10 seconds.  Patient assessed, A&O x4 and has no complaints of chest pain, shortness of breath, or feeling different.  Spoke with NP on call and suggested to page Cardiologist on call for orders going forward.   Cardiology on call paged.

## 2020-06-25 NOTE — ED Notes (Signed)
Cardiologist just rounded on pt. Spoke about pt condition and notified that he has a bed at [facility] now, awaiting transfer. Lidocaine gtt ordered if pt has another vtach episode.

## 2020-06-25 NOTE — ED Notes (Signed)
  Patient had about a 30 second run of V tach.  Went to assess patient and woke him up.  Patient A&O x4.  States he had no chest pains, shortness of breath, and did not feel different.  Patient HR resolved and back down to 65 bpm.  Will notify MD.

## 2020-07-01 ENCOUNTER — Telehealth: Payer: Self-pay

## 2020-07-01 NOTE — Telephone Encounter (Signed)
Transition of Care Contact from Inpatient Facility  Date of Discharge: 06/30/20 Date of Contact: 07/01/20 Method of contact: phone - attempted  Attempted to contact patient to discuss transition of care from inpatient admission.  Patient did not answer the phone.  Message was left on patient's voicemail informing them we would attempt to call them again   Arieanna Pressey, rma 

## 2020-07-20 ENCOUNTER — Encounter: Payer: No Typology Code available for payment source | Admitting: Dermatology

## 2020-07-25 ENCOUNTER — Ambulatory Visit (INDEPENDENT_AMBULATORY_CARE_PROVIDER_SITE_OTHER): Payer: Medicare Other | Admitting: Internal Medicine

## 2020-07-25 ENCOUNTER — Encounter: Payer: Self-pay | Admitting: Internal Medicine

## 2020-07-25 ENCOUNTER — Other Ambulatory Visit: Payer: Self-pay

## 2020-07-25 DIAGNOSIS — Z95810 Presence of automatic (implantable) cardiac defibrillator: Secondary | ICD-10-CM

## 2020-07-25 DIAGNOSIS — K2100 Gastro-esophageal reflux disease with esophagitis, without bleeding: Secondary | ICD-10-CM

## 2020-07-25 DIAGNOSIS — I10 Essential (primary) hypertension: Secondary | ICD-10-CM | POA: Diagnosis not present

## 2020-07-25 DIAGNOSIS — I480 Paroxysmal atrial fibrillation: Secondary | ICD-10-CM | POA: Diagnosis not present

## 2020-07-25 NOTE — Assessment & Plan Note (Signed)
Patient is on Eliquis and he is tolerating it well.

## 2020-07-25 NOTE — Assessment & Plan Note (Signed)
Reflux is under control on medication.

## 2020-07-25 NOTE — Assessment & Plan Note (Signed)
-   I encouraged the patient to lose weight.  - I educated them on making healthy dietary choices including eating more fruits and vegetables and less fried foods. - I encouraged the patient to exercise more, and educated on the benefits of exercise including weight loss, diabetes management, and hypertension management.   

## 2020-07-25 NOTE — Progress Notes (Signed)
Established Patient Office Visit  Subjective:  Patient ID: Brandon Wall, male    DOB: 17-Dec-1940  Age: 80 y.o. MRN: [id number]  CC:  Chief Complaint  Patient presents with  . Insomnia    Patient needs refill of ambien     HPI  [name] presents for general checkup.  Patient has an ablation surgery twice since my last visit he denies any history of chest pain arrhythmia feeling better.  He does not smoke or drink.  He also has a problem with insomnia Past Medical History:  Diagnosis Date  . Actinic keratosis   . COVID-19 virus infection 05/2020  . Dysplastic nevus 08/16/2009   right mid back lat  . Heart disease   . Hx of basal cell carcinoma 11/11/2012   L superior lateral deltoid  . 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 Hx of squamous cell carcinoma 10/04/2014   Crown scalp, L sideburn  . Hypertension   . Morbid obesity ([place])   . PAF (paroxysmal atrial fibrillation) ([place])   . Squamous cell carcinoma of skin 01/12/2010   left dorsum hand  . Squamous cell carcinoma of skin 10/04/2014   crown scalp  . Squamous cell carcinoma of skin 07/14/2015   left sideburn  . Type II diabetes mellitus ([place])     Past Surgical History:  Procedure Laterality Date  . INSERT / REPLACE / REMOVE PACEMAKER  2010  . MELANOMA EXCISION Left 09/24/2012   spinal upper back    Family History  Problem Relation Age of Onset  . Heart disease Mother   . Hypertension Mother   . Heart disease Father   . Hypertension Father   . Brain cancer Brother   . Melanoma Brother     Social History   Socioeconomic History  . Marital status: Married    Spouse name: Not on file  . Number of children: Not on file  . Years of education: Not on file  . Highest education level: Not on file  Occupational History  . Not on file  Tobacco Use  . Smoking status: Never Smoker  . Smokeless tobacco: Never Used  Substance and Sexual Activity  . Alcohol use: Not on file  .  Drug use: Not on file  . Sexual activity: Not on file  Other Topics Concern  . Not on file  Social History Narrative  . Not on file   Social Determinants of Health   Financial Resource Strain: Not on file  Food Insecurity: Not on file  Transportation Needs: Not on file  Physical Activity: Not on file  Stress: Not on file  Social Connections: Not on file  Intimate Partner Violence: Not on file     Current Outpatient Medications:  .  apixaban (ELIQUIS) 5 MG TABS tablet, Take 5 mg by mouth 2 (two) times daily., Disp: , Rfl:  .  aspirin 81 MG EC tablet, Take 81 mg by mouth daily., Disp: , Rfl:  .  fluticasone (FLONASE) 50 MCG/ACT nasal spray, Place 2 sprays into the nose daily as needed for allergies or rhinitis., Disp: , Rfl:  .  furosemide (LASIX) 20 MG tablet, Take 20 mg by mouth daily., Disp: , Rfl:  .  glipiZIDE (GLUCOTROL XL) 10 MG 24 hr tablet, Take 10 mg by mouth daily., Disp: , Rfl:  .  glucose blood test strip, , Disp: , Rfl:  .  JARDIANCE 25 MG TABS tablet, Take 25 mg  by mouth daily., Disp: , Rfl:  .  Melatonin 10 MG TABS, Take 10 mg by mouth at bedtime., Disp: , Rfl:  .  metFORMIN (GLUCOPHAGE) 500 MG tablet, Take 500 mg by mouth 2 (two) times daily., Disp: , Rfl:  .  mexiletine (MEXITIL) 200 MG capsule, Take 200 mg by mouth 3 (three) times daily., Disp: , Rfl:  .  nitroGLYCERIN (NITROSTAT) 0.4 MG SL tablet, Place under the tongue., Disp: , Rfl:  .  pramipexole (MIRAPEX) 0.25 MG tablet, Take 0.25 mg by mouth at bedtime., Disp: , Rfl:  .  sildenafil (VIAGRA) 50 MG tablet, Take 50 mg by mouth as needed for erectile dysfunction., Disp: , Rfl:  .  spironolactone (ALDACTONE) 25 MG tablet, Take 12.5 mg by mouth daily., Disp: , Rfl: 10 .  triamcinolone ointment (KENALOG) 0.1 %, Apply 1 application topically 2 (two) times daily as needed. Apply 1-2 times daily as needed for itch to affected areas. Avoid applying to face, groin, and axilla. Use as directed. Risk of skin atrophy with  long-term use reviewed., Disp: 80 g, Rfl: 4 .  zolpidem (AMBIEN) 5 MG tablet, Take 5 mg by mouth at bedtime as needed for sleep., Disp: , Rfl:  .  metoprolol succinate (TOPROL-XL) 100 MG 24 hr tablet, Take 50 mg by mouth. , Disp: , Rfl:  .  rosuvastatin (CRESTOR) 40 MG tablet, Take 40 mg by mouth daily., Disp: , Rfl:    Allergies  Allergen Reactions  . Ramipril     Other reaction(s): Cough    ROS Review of Systems  Constitutional: Negative.   HENT: Negative.   Eyes: Negative.   Respiratory: Negative.   Cardiovascular: Negative.  Negative for chest pain and leg swelling.  Gastrointestinal: Negative.  Negative for blood in stool and constipation.  Endocrine: Negative.   Genitourinary: Negative.  Negative for dysuria and hematuria.  Musculoskeletal: Negative.   Skin: Negative.  Negative for rash.  Allergic/Immunologic: Negative.   Neurological: Negative.  Negative for tremors and headaches.  Hematological: Negative.   Psychiatric/Behavioral: Negative.  Negative for dysphoric mood.  All other systems reviewed and are negative.     Objective:    Physical Exam Vitals reviewed.  Constitutional:      Appearance: Normal appearance.  HENT:     Mouth/Throat:     Mouth: Mucous membranes are moist.  Eyes:     Pupils: Pupils are equal, round, and reactive to light.  Neck:     Vascular: No carotid bruit.  Cardiovascular:     Rate and Rhythm: Normal rate and regular rhythm.     Pulses: Normal pulses.     Heart sounds: Normal heart sounds.  Pulmonary:     Effort: Pulmonary effort is normal.     Breath sounds: Normal breath sounds.  Abdominal:     General: Bowel sounds are normal.     Palpations: Abdomen is soft. There is no hepatomegaly, splenomegaly or mass.     Tenderness: There is no abdominal tenderness.     Hernia: No hernia is present.  Musculoskeletal:     Cervical back: Neck supple.     Right lower leg: No edema.     Left lower leg: No edema.  Skin:    Findings: No  rash.  Neurological:     Mental Status: He is alert and oriented to person, place, and time.     Motor: No weakness.  Psychiatric:        Mood and Affect: Mood normal.  Behavior: Behavior normal.     There were no vitals taken for this visit. Wt Readings from Last 3 Encounters:  06/24/20 225 lb (102.1 kg)  10/21/18 240 lb (108.9 kg)  07/08/18 241 lb (109.3 kg)     Health Maintenance Due  Topic Date Due  . FOOT EXAM  Never done  . OPHTHALMOLOGY EXAM  Never done  . URINE MICROALBUMIN  Never done  . COVID-19 Vaccine (1) Never done  . TETANUS/TDAP  Never done  . PNA vac Low Risk Adult (2 of 2 - PPSV23) 10/14/2015  . INFLUENZA VACCINE  01/10/2020    There are no preventive care reminders to display for this patient.  No results found for: TSH Lab Results  Component Value Date   WBC 5.8 06/25/2020   HGB 13.5 06/25/2020   HCT 39.3 06/25/2020   MCV 93.1 06/25/2020   PLT 152 06/25/2020   Lab Results  Component Value Date   NA 138 06/25/2020   K 3.8 06/25/2020   CO2 25 06/25/2020   GLUCOSE 113 (H) 06/25/2020   BUN 29 (H) 06/25/2020   CREATININE 1.63 (H) 06/25/2020   BILITOT 0.6 09/10/2014   ALKPHOS 59 09/10/2014   AST 22 09/10/2014   ALT 17 09/10/2014   PROT 7.7 09/10/2014   ALBUMIN 4.5 09/10/2014   CALCIUM 9.0 06/25/2020   ANIONGAP 11 06/25/2020   No results found for: CHOL No results found for: HDL No results found for: LDLCALC No results found for: TRIG No results found for: CHOLHDL Lab Results  Component Value Date   HGBA1C 7.7 (H) 06/25/2020      Assessment & Plan:   Problem List Items Addressed This Visit      Cardiovascular and Mediastinum   Hypertension - Primary    Blood pressure is under control on present medication.      PAF (paroxysmal atrial fibrillation) ([place])    Patient is on Eliquis and he is tolerating it well.        Digestive   GERD (gastroesophageal reflux disease)    Reflux is under control on medication.         Other   Automatic implantable cardioverter-defibrillator in situ    Defibrillator is working well.  Patient denies any chest pain shortness of breath or paroxysmal nocturnal dyspnea.  He was advised to lose weight.      Morbid obesity ([place])    - I encouraged the patient to lose weight.  - I educated them on making healthy dietary choices including eating more fruits and vegetables and less fried foods. - I encouraged the patient to exercise more, and educated on the benefits of exercise including weight loss, diabetes management, and hypertension management.           No orders of the defined types were placed in this encounter.   Follow-up: No follow-ups on file.   Patient has a defibrillator in place which is working well.  Right atrial lead is Medtronic.  Right ventricular lead is Chemical engineer. [name], MD

## 2020-07-25 NOTE — Assessment & Plan Note (Signed)
Defibrillator is working well.  Patient denies any chest pain shortness of breath or paroxysmal nocturnal dyspnea.  He was advised to lose weight.

## 2020-07-25 NOTE — Assessment & Plan Note (Signed)
Blood pressure is under control on present medication 

## 2020-08-08 ENCOUNTER — Telehealth: Payer: Self-pay

## 2020-08-08 NOTE — Telephone Encounter (Signed)
Transition Care Management Follow-up Telephone Call  Date of discharge and from where: 08/07/2020 from [facility].   How have you been since you were released from the hospital? Pt states that he is feeling better, still weak but patient is feeling much better.   Any questions or concerns? No  Items Reviewed:  Did the pt receive and understand the discharge instructions provided? Yes   Medications obtained and verified? Yes   Other? No   Any new allergies since your discharge? No   Dietary orders reviewed? Heart Healthy and DM  Do you have support at home? Yes    Functional Questionnaire: (I = Independent and D = Dependent) ADLs: I  Bathing/Dressing- I  Meal Prep- I  Eating- I  Maintaining continence- I  Transferring/Ambulation- I  Managing Meds- I   Follow up appointments reviewed:   PCP Hospital f/u appt confirmed? Yes  Scheduled to see Rolanda Lundborg, MD on 08/08/2020 @ 11:10am.  [facility] f/u appt confirmed? Yes  Scheduled to see Lovenia Shuck, MD on 08/29/2020 @ 10:30am.  Are transportation arrangements needed? No   If their condition worsens, is the pt aware to call PCP or go to the Emergency Dept.? Yes  Was the patient provided with contact information for the PCP's office or ED? Yes  Was to pt encouraged to call back with questions or concerns? Yes

## 2020-09-29 ENCOUNTER — Encounter: Payer: Self-pay | Admitting: Dermatology

## 2020-09-29 ENCOUNTER — Ambulatory Visit (INDEPENDENT_AMBULATORY_CARE_PROVIDER_SITE_OTHER): Payer: Medicare Other | Admitting: Dermatology

## 2020-09-29 ENCOUNTER — Other Ambulatory Visit: Payer: Self-pay

## 2020-09-29 DIAGNOSIS — D229 Melanocytic nevi, unspecified: Secondary | ICD-10-CM

## 2020-09-29 DIAGNOSIS — D692 Other nonthrombocytopenic purpura: Secondary | ICD-10-CM

## 2020-09-29 DIAGNOSIS — L821 Other seborrheic keratosis: Secondary | ICD-10-CM

## 2020-09-29 DIAGNOSIS — L820 Inflamed seborrheic keratosis: Secondary | ICD-10-CM | POA: Diagnosis not present

## 2020-09-29 DIAGNOSIS — D1800 Hemangioma unspecified site: Secondary | ICD-10-CM

## 2020-09-29 DIAGNOSIS — Z85828 Personal history of other malignant neoplasm of skin: Secondary | ICD-10-CM | POA: Diagnosis not present

## 2020-09-29 DIAGNOSIS — L570 Actinic keratosis: Secondary | ICD-10-CM

## 2020-09-29 DIAGNOSIS — Z86006 Personal history of melanoma in-situ: Secondary | ICD-10-CM | POA: Diagnosis not present

## 2020-09-29 DIAGNOSIS — Z1283 Encounter for screening for malignant neoplasm of skin: Secondary | ICD-10-CM | POA: Diagnosis not present

## 2020-09-29 DIAGNOSIS — L814 Other melanin hyperpigmentation: Secondary | ICD-10-CM

## 2020-09-29 DIAGNOSIS — L578 Other skin changes due to chronic exposure to nonionizing radiation: Secondary | ICD-10-CM | POA: Diagnosis not present

## 2020-09-29 NOTE — Patient Instructions (Signed)

## 2020-09-29 NOTE — Progress Notes (Signed)
Follow-Up Visit   Subjective  Brandon Wall is a 80 y.o. male who presents for the following: check spot (L lower leg, non healing ~16m), check spots on face (Face, scaly), and Total body skin exam (Hx of Melanoma IS, BCC, SCC, SCC IS). The patient presents for Total-Body Skin Exam (TBSE) for skin cancer screening and mole check.  The following portions of the chart were reviewed this encounter and updated as appropriate:   Tobacco  Allergies  Meds  Problems  Med Hx  Surg Hx  Fam Hx     Review of Systems:  No other skin or systemic complaints except as noted in HPI or Assessment and Plan.  Objective  Well appearing patient in no apparent distress; mood and affect are within normal limits.  A full examination was performed including scalp, head, eyes, ears, nose, lips, neck, chest, axillae, abdomen, back, buttocks, bilateral upper extremities, bilateral lower extremities, hands, feet, fingers, toes, fingernails, and toenails. All findings within normal limits unless otherwise noted below.  Objective  L spinal upper back: Scar clear to visual exam and palpation, no lymphadenopathy  Objective  face, scalp x 12 (12): Pink scaly macules   Objective  Left ant lat ankle x 1: Erythematous keratotic or waxy stuck-on papule or plaque.    Assessment & Plan    Lentigines - Scattered tan macules - Due to sun exposure - Benign-appering, observe - Recommend daily broad spectrum sunscreen SPF 30+ to sun-exposed areas, reapply every 2 hours as needed. - Call for any changes  Seborrheic Keratoses - Stuck-on, waxy, tan-brown papules and/or plaques  - Benign-appearing - Discussed benign etiology and prognosis. - Observe - Call for any changes  Melanocytic Nevi - Tan-brown and/or pink-flesh-colored symmetric macules and papules - Benign appearing on exam today - Observation - Call clinic for new or changing moles - Recommend daily use of broad spectrum spf 30+ sunscreen to  sun-exposed areas.   Hemangiomas - Red papules - Discussed benign nature - Observe - Call for any changes  Actinic Damage - Chronic condition, secondary to cumulative UV/sun exposure - diffuse scaly erythematous macules with underlying dyspigmentation - Recommend daily broad spectrum sunscreen SPF 30+ to sun-exposed areas, reapply every 2 hours as needed.  - Staying in the shade or wearing long sleeves, sun glasses (UVA+UVB protection) and wide brim hats (4-inch brim around the entire circumference of the hat) are also recommended for sun protection.  - Call for new or changing lesions.  Skin cancer screening performed today.  Purpura - Chronic; persistent and recurrent.  Treatable, but not curable. - Violaceous macules and patches - Benign - Related to trauma, age, sun damage and/or use of blood thinners, chronic use of topical and/or oral steroids - Observe - Can use OTC arnica containing moisturizer such as Dermend Bruise Formula if desired - Call for worsening or other concerns  History of Basal Cell Carcinoma of the Skin - No evidence of recurrence today - Recommend regular full body skin exams - Recommend daily broad spectrum sunscreen SPF 30+ to sun-exposed areas, reapply every 2 hours as needed.  - Call if any new or changing lesions are noted between office visits - L sup lat deltoid  History of Squamous Cell Carcinoma of the Skin - No evidence of recurrence today - No lymphadenopathy - Recommend regular full body skin exams - Recommend daily broad spectrum sunscreen SPF 30+ to sun-exposed areas, reapply every 2 hours as needed.  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  No lymphadenopathy.  Observe for recurrence. Call clinic for new or changing lesions.  Recommend regular skin exams, daily broad-spectrum spf 30+ sunscreen use,  and photoprotection.     AK (actinic keratosis) (12) face, scalp x 12 Destruction of lesion - face, scalp x 12 Complexity: simple   Destruction method: cryotherapy   Informed consent: discussed and consent obtained   Timeout:  patient name, date of birth, surgical site, and procedure verified Lesion destroyed using liquid nitrogen: Yes   Region frozen until ice ball extended beyond lesion: Yes   Outcome: patient tolerated procedure well with no complications   Post-procedure details: wound care instructions given     Inflamed seborrheic keratosis Left ant lat ankle x 1  Destruction of lesion - Left ant lat ankle x 1 Complexity: simple   Destruction method: cryotherapy   Informed consent: discussed and consent obtained   Timeout:  patient name, date of birth, surgical site, and procedure verified Lesion destroyed using liquid nitrogen: Yes   Region frozen until ice ball extended beyond lesion: Yes   Outcome: patient tolerated procedure well with no complications   Post-procedure details: wound care instructions given    Skin cancer screening  Return in about 1 year (around 09/29/2021) for TBSE, Hx of Melanoma IS, Hx of BCC, Hx of SCC, Hx of AKs.   I, Othelia Pulling, RMA, am acting as scribe for Sarina Ser, MD .  Documentation: I have reviewed the above documentation for accuracy and completeness, and I agree with the above.  Sarina Ser, MD

## 2020-10-03 ENCOUNTER — Encounter: Payer: Self-pay | Admitting: Dermatology

## 2020-12-20 ENCOUNTER — Emergency Department
Admission: EM | Admit: 2020-12-20 | Discharge: 2020-12-20 | Disposition: A | Discharge status: Home / Self Care | Payer: Medicare Other | Attending: Emergency Medicine | Admitting: Emergency Medicine

## 2020-12-20 ENCOUNTER — Encounter: Payer: Self-pay | Admitting: Medical Oncology

## 2020-12-20 ENCOUNTER — Emergency Department: Payer: Medicare Other

## 2020-12-20 ENCOUNTER — Other Ambulatory Visit: Payer: Self-pay

## 2020-12-20 DIAGNOSIS — E119 Type 2 diabetes mellitus without complications: Secondary | ICD-10-CM | POA: Insufficient documentation

## 2020-12-20 DIAGNOSIS — Z85828 Personal history of other malignant neoplasm of skin: Secondary | ICD-10-CM | POA: Insufficient documentation

## 2020-12-20 DIAGNOSIS — E860 Dehydration: Secondary | ICD-10-CM | POA: Diagnosis not present

## 2020-12-20 DIAGNOSIS — Z7984 Long term (current) use of oral hypoglycemic drugs: Secondary | ICD-10-CM | POA: Insufficient documentation

## 2020-12-20 DIAGNOSIS — I2510 Atherosclerotic heart disease of native coronary artery without angina pectoris: Secondary | ICD-10-CM | POA: Insufficient documentation

## 2020-12-20 DIAGNOSIS — Z8616 Personal history of COVID-19: Secondary | ICD-10-CM | POA: Diagnosis not present

## 2020-12-20 DIAGNOSIS — N179 Acute kidney failure, unspecified: Secondary | ICD-10-CM | POA: Insufficient documentation

## 2020-12-20 DIAGNOSIS — I10 Essential (primary) hypertension: Secondary | ICD-10-CM | POA: Diagnosis not present

## 2020-12-20 DIAGNOSIS — I480 Paroxysmal atrial fibrillation: Secondary | ICD-10-CM | POA: Insufficient documentation

## 2020-12-20 DIAGNOSIS — R42 Dizziness and giddiness: Secondary | ICD-10-CM

## 2020-12-20 DIAGNOSIS — Z7982 Long term (current) use of aspirin: Secondary | ICD-10-CM | POA: Insufficient documentation

## 2020-12-20 DIAGNOSIS — Z951 Presence of aortocoronary bypass graft: Secondary | ICD-10-CM | POA: Insufficient documentation

## 2020-12-20 DIAGNOSIS — Z7901 Long term (current) use of anticoagulants: Secondary | ICD-10-CM | POA: Diagnosis not present

## 2020-12-20 LAB — CBC
HCT: 35.8 % — ABNORMAL LOW (ref 39.0–52.0)
Hemoglobin: 12 g/dL — ABNORMAL LOW (ref 13.0–17.0)
MCH: 31.7 pg (ref 26.0–34.0)
MCHC: 33.5 g/dL (ref 30.0–36.0)
MCV: 94.5 fL (ref 80.0–100.0)
Platelets: 149 10*3/uL — ABNORMAL LOW (ref 150–400)
RBC: 3.79 MIL/uL — ABNORMAL LOW (ref 4.22–5.81)
RDW: 14.8 % (ref 11.5–15.5)
WBC: 6.2 10*3/uL (ref 4.0–10.5)
nRBC: 0 % (ref 0.0–0.2)

## 2020-12-20 LAB — URINALYSIS, COMPLETE (UACMP) WITH MICROSCOPIC
Bacteria, UA: NONE SEEN
Bilirubin Urine: NEGATIVE
Glucose, UA: >=500 mg/dL — AB
Hgb urine dipstick: NEGATIVE
Ketones, ur: NEGATIVE mg/dL
Leukocytes,Ua: NEGATIVE
Nitrite: NEGATIVE
Protein, ur: NEGATIVE mg/dL
Specific Gravity, Urine: 1.016 (ref 1.005–1.030)
Squamous Epithelial / HPF: NONE SEEN (ref 0–5)
pH: 5 (ref 5.0–8.0)

## 2020-12-20 LAB — BASIC METABOLIC PANEL
Anion gap: 6 (ref 5–15)
BUN: 37 mg/dL — ABNORMAL HIGH (ref 8–23)
CO2: 25 mmol/L (ref 22–32)
Calcium: 8.8 mg/dL — ABNORMAL LOW (ref 8.9–10.3)
Chloride: 104 mmol/L (ref 98–111)
Creatinine, Ser: 2.35 mg/dL — ABNORMAL HIGH (ref 0.61–1.24)
GFR, Estimated: 27 mL/min — ABNORMAL LOW (ref >60)
Glucose, Bld: 193 mg/dL — ABNORMAL HIGH (ref 70–99)
Potassium: 4.1 mmol/L (ref 3.5–5.1)
Sodium: 135 mmol/L (ref 135–145)

## 2020-12-20 LAB — TROPONIN I (HIGH SENSITIVITY): Troponin I (High Sensitivity): 6 ng/L (ref <18)

## 2020-12-20 LAB — MAGNESIUM: Magnesium: 2.3 mg/dL (ref 1.7–2.4)

## 2020-12-20 MED ORDER — LACTATED RINGERS IV BOLUS
1000.0000 mL | Freq: Once | INTRAVENOUS | Status: AC
  Administered 2020-12-20: 1000 mL via INTRAVENOUS

## 2020-12-20 NOTE — ED Triage Notes (Signed)
Pt from home via ems, states that he was working on his car when he bent over and began to feel dizzy, fell back to the ground landing on his back and left elbow. Pt denies feeling dizzy at the moment. Denies pain.

## 2020-12-20 NOTE — ED Provider Notes (Signed)
[facility] Provider Note   ____________________________________________   Event Date/Time   First MD Initiated Contact with Patient 12/20/20 1403     (approximate)  I have reviewed the triage vital signs and the nursing notes.   HISTORY  Chief Complaint Dizziness    HPI Brandon Wall is a 80 y.o. male with past medical history of hypertension, hyperlipidemia, diabetes, GERD, paroxysmal atrial fibrillation on Eliquis, CAD status post CABG, and VT status post ablation who presents to the ED complaining of dizziness.  Patient states that he was outside doing work in his garden just prior to arrival when he started to feel dizzy and lightheaded.  He states he was leaning forward and started to lose his balance, resulting in him slowly falling backwards.  He states he hit his left elbow but denies hitting his head or losing consciousness.  He did not have any chest pain or shortness of breath with this episode and now states he feels back to normal.  He has otherwise been feeling well recently with no fevers, cough, vomiting, diarrhea, abdominal pain, or dysuria.        Past Medical History:  Diagnosis Date   Actinic keratosis    COVID-19 virus infection 05/2020   Dysplastic nevus 08/16/2009   right mid back lat   Heart disease    Hx of basal cell carcinoma 11/11/2012   L superior lateral deltoid   Hx of melanoma in situ 09/11/2012   L spinal upper back   Hx of squamous cell carcinoma 10/04/2014   Crown scalp, L sideburn   Hypertension    Morbid obesity ([place])    PAF (paroxysmal atrial fibrillation) ([place])    Squamous cell carcinoma of skin 01/12/2010   left dorsum hand   Squamous cell carcinoma of skin 10/04/2014   crown scalp   Squamous cell carcinoma of skin 07/14/2015   left sideburn   Type II diabetes mellitus [facility])     Patient Active Problem List   Diagnosis Date Noted   Nonsustained ventricular tachycardia ([place])     Coronary artery disease involving native coronary artery of native heart without angina pectoris    Hypertension    Morbid obesity ([place])    PAF (paroxysmal atrial fibrillation) ([place])    Type II diabetes mellitus ([place])    COVID-19 virus infection 06/2020   GERD (gastroesophageal reflux disease) 06/11/2016   Chest pain 09/10/2014   ST elevation (STEMI) myocardial infarction ([place]) 07/18/2014   Diabetes ([place]) 11/06/2011   A-fib ([place]) 08/01/2011   FOM (frequency of micturition) 08/01/2011   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 Authorizing Provider  apixaban (ELIQUIS) 5 MG TABS tablet Take 5 mg by mouth 2 (two) times daily. 02/25/14   [provider]  aspirin 81 MG EC tablet Take 81 mg by mouth daily.    [provider]  fluticasone (FLONASE) 50 MCG/ACT nasal spray  Place 2 sprays into the nose daily as needed for allergies or rhinitis.    [provider]  furosemide (LASIX) 20 MG tablet Take 20 mg by mouth daily. 04/10/20   [provider]  glipiZIDE (GLUCOTROL XL) 10 MG 24 hr tablet Take 10 mg by mouth daily. 05/01/20   [provider]  glucose blood test strip  02/19/13   [provider]  JARDIANCE 25 MG TABS tablet Take 25 mg by mouth daily. 03/05/18   [provider]  Melatonin 10 MG TABS Take 10 mg by mouth at bedtime.     [provider]  metFORMIN (GLUCOPHAGE) 500 MG tablet Take 500 mg by mouth 2 (two) times daily. 06/16/20   [provider]  metoprolol succinate (TOPROL-XL) 100 MG 24 hr tablet Take 50 mg by mouth.  10/27/14 03/10/18  [provider]  mexiletine (MEXITIL) 200 MG capsule Take 200 mg by mouth 3 (three) times daily. 06/21/20   [provider]  nitroGLYCERIN (NITROSTAT) 0.4 MG SL tablet Place under the tongue. 06/26/13   [provider]  pramipexole (MIRAPEX) 0.25 MG tablet Take 0.25 mg by mouth at bedtime. 05/08/20   [provider]  rosuvastatin (CRESTOR) 40 MG tablet Take 40 mg by mouth daily. 07/14/14 06/24/20  [provider]  sildenafil (VIAGRA) 50 MG tablet Take 50 mg by mouth as needed for erectile dysfunction. 03/23/20   [provider]  spironolactone (ALDACTONE) 25 MG tablet Take 12.5 mg by mouth daily. 02/01/18   [provider]  triamcinolone ointment (KENALOG) 0.1 % Apply 1 application topically 2 (two) times daily as needed. Apply 1-2 times daily as needed for itch to affected areas. Avoid applying to face, groin, and axilla. Use as directed. Risk of skin atrophy with long-term use reviewed. 02/18/20   Moye, [place], MD  zolpidem (AMBIEN) 5 MG tablet Take 5 mg by mouth at bedtime as needed for sleep.    [provider]    Allergies Ramipril  Family History  Problem Relation Age of Onset   Heart disease Mother    Hypertension Mother    Heart disease Father    Hypertension Father    Brain cancer Brother    Melanoma Brother     Social History Social History   Tobacco Use   Smoking status: Never   Smokeless tobacco: Never    Review of Systems  Constitutional: No fever/chills Eyes: No visual changes. ENT: No sore throat. Cardiovascular: Denies chest pain.  Positive for lightheadedness and near syncope. Respiratory: Denies shortness of breath. Gastrointestinal: No abdominal pain.  No nausea,  no vomiting.  No diarrhea.  No constipation. Genitourinary: Negative for dysuria. Musculoskeletal: Negative for back pain. Skin: Negative for rash. Neurological: Negative for headaches, focal weakness or numbness.  ____________________________________________   PHYSICAL EXAM:  VITAL SIGNS: ED Triage Vitals  Enc Vitals Group     BP 12/20/20 1337 92/60     Pulse Rate 12/20/20 1337 60     Resp 12/20/20 1337 17     Temp 12/20/20 1337 98.5 F (36.9 C)     Temp Source 12/20/20 1337 Oral     SpO2 12/20/20 1337 95 %     Weight 12/20/20 1338 224 lb 13.9 oz (102 kg)     Height 12/20/20 1338 6\' 3"  (1.905 m)     Head Circumference --      Peak Flow --      Pain Score 12/20/20 1338 0  Pain Loc --      Pain Edu? --      Excl. in [place]? --     Constitutional: Alert and oriented. Eyes: Conjunctivae are normal. Head: Atraumatic. Nose: No congestion/rhinnorhea. Mouth/Throat: Mucous membranes are moist. Neck: Normal ROM Cardiovascular: Normal rate, regular rhythm. Grossly normal heart sounds.  2+ radial pulses bilaterally. Respiratory: Normal respiratory effort.  No retractions. Lungs CTAB. Gastrointestinal: Soft and nontender. No distention. Genitourinary: deferred Musculoskeletal: No lower extremity tenderness nor edema. Neurologic:  Normal speech and language. No gross focal neurologic deficits are appreciated. Skin:  Skin is warm, dry and intact. No rash noted. Psychiatric: Mood and affect are normal. Speech and behavior are normal.  ____________________________________________   LABS (all labs ordered are listed, but only abnormal results are displayed)  Labs Reviewed  BASIC METABOLIC PANEL - Abnormal; Notable for the following components:      Result Value   Glucose, Bld 193 (*)    BUN 37 (*)    Creatinine, Ser 2.35 (*)    Calcium 8.8 (*)    GFR, Estimated 27 (*)    All other components within normal limits  CBC - Abnormal; Notable for the following components:   RBC  3.79 (*)    Hemoglobin 12.0 (*)    HCT 35.8 (*)    Platelets 149 (*)    All other components within normal limits  URINALYSIS, COMPLETE (UACMP) WITH MICROSCOPIC - Abnormal; Notable for the following components:   Color, Urine STRAW (*)    APPearance CLEAR (*)    Glucose, UA >=500 (*)    All other components within normal limits  MAGNESIUM  TROPONIN I (HIGH SENSITIVITY)   ____________________________________________  EKG  ED ECG REPORT I, Blake Divine, the attending physician, personally viewed and interpreted this ECG.   Date: 12/20/2020  EKG Time: 13:43  Rate: 60  Rhythm: Atrial paced rhythm with prolonged AV conduction  Axis: Normal  Intervals:nonspecific intraventricular conduction delay  ST&T Change: None    PROCEDURES  Procedure(s) performed (including Critical Care):  Procedures   ____________________________________________   INITIAL IMPRESSION / ASSESSMENT AND PLAN / ED COURSE      80 year old male with past medical history of hypertension, hyperlipidemia, diabetes, atrial fibrillation on Eliquis, CAD, and V. tach status post ablation who presents to the ED complaining of episode of dizziness and lightheadedness causing him to fall slowly backwards.  There is no evidence of trauma to his head or neck, he has small abrasion to his left elbow but range his elbow without difficulty and I doubt acute traumatic injury.  EKG shows paced rhythm but with significantly delayed AV conduction, unclear if this is chronic given no recent EKGs for comparison and his primary cardiologist is at [facility].  We will interrogate his pacemaker, labs thus far remarkable for AKI that is likely due to dehydration and we will hydrate with IV fluids, observe on cardiac monitor.  No events noted on the cardiac monitor, patient remains in paced rhythm.  No events noted on interrogation of his pacemaker, which I reviewed with Dr. Ubaldo Glassing of cardiology.  Dr. Ubaldo Glassing states that patient's pacemaker is  intentionally set to produce long PR interval when he is being atrially paced, this would not result in any lightheadedness or near syncope.  Patient's near syncope seems more likely to be related to dehydration and heat exposure given his AKI.  He was hydrated with IV fluids and offered admission to the hospital for observation, but declined.  He wishes to  follow-up with his PCP or nephrologist for recheck of kidney function, which she was counseled to do later this week.  He was counseled to return to the ED for new worsening symptoms, patient agrees with plan.      ____________________________________________   FINAL CLINICAL IMPRESSION(S) / ED DIAGNOSES  Final diagnoses:  Dizziness  AKI (acute kidney injury) ([place])  Dehydration     ED Discharge Orders     None        Note:  This document was prepared using Dragon voice recognition software and may include unintentional dictation errors.    Blake Divine, MD 12/20/20 [phone]

## 2020-12-20 NOTE — ED Triage Notes (Addendum)
Arrives via [facility].  Per report, patient had a dizzy event.  Per report, patient was standing outside for about 30 minutes. Bending over.  Felt dizzy , sat on the ground and required assistance to get up.  20 g LAC.  Received 500 cc NS

## 2021-01-04 LAB — HEMOGLOBIN A1C
Hemoglobin A1C: 9
Hemoglobin A1C: 9.2

## 2021-01-04 LAB — MICROALBUMIN, URINE: Microalb, Ur: 17

## 2021-01-24 ENCOUNTER — Ambulatory Visit: Payer: Medicare Other

## 2021-01-24 ENCOUNTER — Other Ambulatory Visit: Payer: Self-pay

## 2021-01-24 DIAGNOSIS — U071 COVID-19: Secondary | ICD-10-CM

## 2021-02-06 ENCOUNTER — Other Ambulatory Visit: Payer: Self-pay

## 2021-02-06 ENCOUNTER — Encounter: Payer: Self-pay | Admitting: Internal Medicine

## 2021-02-06 ENCOUNTER — Ambulatory Visit (INDEPENDENT_AMBULATORY_CARE_PROVIDER_SITE_OTHER): Payer: Medicare Other | Admitting: Internal Medicine

## 2021-02-06 VITALS — BP 124/79 | HR 60 | Ht 75.0 in | Wt 228.7 lb

## 2021-02-06 DIAGNOSIS — I480 Paroxysmal atrial fibrillation: Secondary | ICD-10-CM

## 2021-02-06 DIAGNOSIS — I472 Ventricular tachycardia: Secondary | ICD-10-CM | POA: Diagnosis not present

## 2021-02-06 DIAGNOSIS — I255 Ischemic cardiomyopathy: Secondary | ICD-10-CM

## 2021-02-06 DIAGNOSIS — I2510 Atherosclerotic heart disease of native coronary artery without angina pectoris: Secondary | ICD-10-CM

## 2021-02-06 DIAGNOSIS — F419 Anxiety disorder, unspecified: Secondary | ICD-10-CM

## 2021-02-06 DIAGNOSIS — I10 Essential (primary) hypertension: Secondary | ICD-10-CM

## 2021-02-06 DIAGNOSIS — I25708 Atherosclerosis of coronary artery bypass graft(s), unspecified, with other forms of angina pectoris: Secondary | ICD-10-CM | POA: Diagnosis not present

## 2021-02-06 DIAGNOSIS — I4729 Other ventricular tachycardia: Secondary | ICD-10-CM

## 2021-02-06 DIAGNOSIS — K2100 Gastro-esophageal reflux disease with esophagitis, without bleeding: Secondary | ICD-10-CM

## 2021-02-06 MED ORDER — ALPRAZOLAM 0.25 MG PO TABS: 0.2500 mg | ORAL_TABLET | Freq: Two times a day (BID) | ORAL | 0 refills | Status: DC | PRN

## 2021-02-06 NOTE — Assessment & Plan Note (Signed)
-   Patient experiencing high levels of anxiety.  - Encouraged patient to engage in relaxing activities like yoga, meditation, journaling, going for a walk, or participating in a hobby.  - Encouraged patient to reach out to trusted friends or family members about recent struggles, Patient was advised to read A book, how to stop worrying and start living, it is good book to read to control  the stress  

## 2021-02-06 NOTE — Assessment & Plan Note (Signed)
Patient is doing well after bypass surgery heart is regular chest is clear, we will continue patient cardiac medication

## 2021-02-06 NOTE — Assessment & Plan Note (Signed)
Patient is stable at the present time denies any chest pain or shortness of breath.

## 2021-02-06 NOTE — Assessment & Plan Note (Signed)

## 2021-02-06 NOTE — Progress Notes (Signed)
New Patient Office Visit  Subjective:  Patient ID: Brandon Wall, male    DOB: 05/21/1941  Age: 80 y.o. MRN: [id number]  CC: Patient presents for  New pt visit Past Medical History:  Diagnosis Date   Actinic keratosis    COVID-19 virus infection 05/2020   Dysplastic nevus 08/16/2009   right mid back lat   Heart disease    Hx of basal cell carcinoma 11/11/2012   L superior lateral deltoid   Hx of melanoma in situ 09/11/2012   L spinal upper back   Hx of squamous cell carcinoma 10/04/2014   Crown scalp, L sideburn   Hypertension    Morbid obesity ([place])    PAF (paroxysmal atrial fibrillation) ([place])    Squamous cell carcinoma of skin 01/12/2010   left dorsum hand   Squamous cell carcinoma of skin 10/04/2014   crown scalp   Squamous cell carcinoma of skin 07/14/2015   left sideburn   Type II diabetes mellitus (HCC)      Current Outpatient Medications:    ALPRAZolam (XANAX) 0.25 MG tablet, Take 1 tablet (0.25 mg total) by mouth 2 (two) times daily as needed for anxiety., Disp: 60 tablet, Rfl: 0   apixaban (ELIQUIS) 5 MG TABS tablet, Take 5 mg by mouth 2 (two) times daily., Disp: , Rfl:    aspirin 81 MG EC tablet, Take 81 mg by mouth daily., Disp: , Rfl:    fluticasone (FLONASE) 50 MCG/ACT nasal spray, Place 2 sprays into the nose daily as needed for allergies or rhinitis., Disp: , Rfl:    furosemide (LASIX) 20 MG tablet, Take 20 mg by mouth daily., Disp: , Rfl:    glipiZIDE (GLUCOTROL XL) 10 MG 24 hr tablet, Take 10 mg by mouth daily., Disp: , Rfl:    glucose blood test strip, , Disp: , Rfl:    JARDIANCE 25 MG TABS tablet, Take 25 mg by mouth daily., Disp: , Rfl:    Melatonin 10 MG TABS, Take 10 mg by mouth at bedtime., Disp: , Rfl:    metFORMIN (GLUCOPHAGE) 500 MG tablet, Take 500 mg by mouth 2 (two) times daily., Disp: , Rfl:    mexiletine (MEXITIL) 200 MG capsule, Take 200 mg by mouth 3 (three) times daily., Disp: , Rfl:    nitroGLYCERIN (NITROSTAT) 0.4 MG SL tablet,  Place under the tongue., Disp: , Rfl:    pramipexole (MIRAPEX) 0.25 MG tablet, Take 0.25 mg by mouth at bedtime., Disp: , Rfl:    sildenafil (VIAGRA) 50 MG tablet, Take 50 mg by mouth as needed for erectile dysfunction., Disp: , Rfl:    spironolactone (ALDACTONE) 25 MG tablet, Take 12.5 mg by mouth daily., Disp: , Rfl: 10   triamcinolone ointment (KENALOG) 0.1 %, Apply 1 application topically 2 (two) times daily as needed. Apply 1-2 times daily as needed for itch to affected areas. Avoid applying to face, groin, and axilla. Use as directed. Risk of skin atrophy with long-term use reviewed., Disp: 80 g, Rfl: 4   zolpidem (AMBIEN) 5 MG tablet, Take 5 mg by mouth at bedtime as needed for sleep., Disp: , Rfl:    metoprolol succinate (TOPROL-XL) 100 MG 24 hr tablet, Take 50 mg by mouth. , Disp: , Rfl:    rosuvastatin (CRESTOR) 40 MG tablet, Take 40 mg by mouth daily., Disp: , Rfl:    Past Surgical History:  Procedure Laterality Date   INSERT / REPLACE / REMOVE PACEMAKER  2010   MELANOMA EXCISION Left  09/24/2012   spinal upper back    Family History  Problem Relation Age of Onset   Heart disease Mother    Hypertension Mother    Heart disease Father    Hypertension Father    Brain cancer Brother    Melanoma Brother     Social History   Socioeconomic History   Marital status: Married    Spouse name: Not on file   Number of children: Not on file   Years of education: Not on file   Highest education level: Not on file  Occupational History   Not on file  Tobacco Use   Smoking status: Never   Smokeless tobacco: Never  Substance and Sexual Activity   Alcohol use: Not on file   Drug use: Not on file   Sexual activity: Not on file  Other Topics Concern   Not on file  Social History Narrative   Not on file   Social Determinants of Health   Financial Resource Strain: Not on file  Food Insecurity: Not on file  Transportation Needs: Not on file  Physical Activity: Not on file   Stress: Not on file  Social Connections: Not on file  Intimate Partner Violence: Not on file    ROS Review of Systems  Constitutional: Negative.   HENT: Negative.    Eyes: Negative.   Respiratory: Negative.  Negative for apnea, cough, choking, chest tightness and wheezing.   Cardiovascular: Negative.  Negative for chest pain, palpitations and leg swelling.  Gastrointestinal: Negative.  Negative for constipation.  Endocrine: Negative.  Negative for polydipsia and polyphagia.  Genitourinary: Negative.  Negative for dysuria and enuresis.  Musculoskeletal: Negative.   Skin: Negative.   Allergic/Immunologic: Negative.   Neurological: Negative.  Negative for dizziness, seizures, syncope and headaches.  Hematological: Negative.  Does not bruise/bleed easily.  Psychiatric/Behavioral:  Positive for sleep disturbance. Negative for agitation, behavioral problems, confusion, self-injury and suicidal ideas. The patient is nervous/anxious.   All other systems reviewed and are negative.  Objective:   Today's Vitals: BP 124/79   Pulse 60   Ht '6\' 3"'$  (1.905 m)   Wt 228 lb 11.2 oz (103.7 kg)   BMI 28.59 kg/m   Physical Exam Constitutional:      Appearance: Normal appearance.  HENT:     Head: Normocephalic.     Nose: Nose normal.     Mouth/Throat:     Mouth: Mucous membranes are moist.  Eyes:     Pupils: Pupils are equal, round, and reactive to light.  Cardiovascular:     Rate and Rhythm: Normal rate and regular rhythm.  Pulmonary:     Effort: Pulmonary effort is normal.     Breath sounds: Normal breath sounds.  Abdominal:     General: Abdomen is flat.     Palpations: Abdomen is soft.  Musculoskeletal:        General: Normal range of motion.     Cervical back: Normal range of motion.  Skin:    General: Skin is warm.  Neurological:     General: No focal deficit present.  Psychiatric:        Mood and Affect: Mood normal.    Assessment & Plan:   Problem List Items Addressed  This Visit       Cardiovascular and Mediastinum   A-fib ([place])    Patient is stable at the present time denies any chest pain or shortness of breath.      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  Patient will be follow-up in 3 months  or earlier as needed, patient will call me back for any change in the cardiovascular symptoms Patient was advised to buy a book from local bookstore concerning blood pressure and read several chapters  every day.  This will be supplemented by some of the material we will give him from the office.  Patient should also utilize other resources like YouTube and Internet to learn more about the blood pressure and the diet.        Digestive   GERD (gastroesophageal reflux disease)     Other   Morbid obesity ([place])   Anxiety    - Patient experiencing high levels of anxiety.  - Encouraged patient to engage in relaxing activities like yoga, meditation, journaling, going for a walk, or participating in a hobby.  - Encouraged patient to reach out to trusted friends or family members about recent struggles, Patient was advised to read A book, how to stop worrying and start living, it is good book to read to control  the stress       Relevant Medications   ALPRAZolam (XANAX) 0.25 MG tablet    Outpatient Encounter  Medications as of 02/06/2021  Medication Sig   ALPRAZolam (XANAX) 0.25 MG tablet Take 1 tablet (0.25 mg total) by mouth 2 (two) times daily as needed for anxiety.   apixaban (ELIQUIS) 5 MG TABS tablet Take 5 mg by mouth 2 (two) times daily.   aspirin 81 MG EC tablet Take 81 mg by mouth daily.   fluticasone (FLONASE) 50 MCG/ACT nasal spray Place 2 sprays into the nose daily as needed for allergies or rhinitis.   furosemide (LASIX) 20 MG tablet Take 20 mg by mouth daily.   glipiZIDE (GLUCOTROL XL) 10 MG 24 hr tablet Take 10 mg by mouth daily.   glucose blood test strip    JARDIANCE 25 MG TABS tablet Take 25 mg by mouth daily.   Melatonin 10 MG TABS Take 10 mg by mouth at bedtime.   metFORMIN (GLUCOPHAGE) 500 MG tablet Take 500 mg by mouth 2 (two) times daily.   mexiletine (MEXITIL) 200 MG capsule Take 200 mg by mouth 3 (three) times daily.   nitroGLYCERIN (NITROSTAT) 0.4 MG SL tablet Place under the tongue.   pramipexole (MIRAPEX) 0.25 MG tablet Take 0.25 mg by mouth at bedtime.   sildenafil (VIAGRA) 50 MG tablet Take 50 mg by mouth as needed for erectile dysfunction.   spironolactone (ALDACTONE) 25 MG tablet Take 12.5 mg by mouth daily.   triamcinolone ointment (KENALOG) 0.1 % Apply 1 application topically 2 (two) times daily as needed. Apply 1-2 times daily as needed for itch to affected areas. Avoid applying to face, groin, and axilla. Use as directed. Risk of skin atrophy with long-term use  reviewed.   zolpidem (AMBIEN) 5 MG tablet Take 5 mg by mouth at bedtime as needed for sleep.   metoprolol succinate (TOPROL-XL) 100 MG 24 hr tablet Take 50 mg by mouth.    rosuvastatin (CRESTOR) 40 MG tablet Take 40 mg by mouth daily.   No facility-administered encounter medications on file as of 02/06/2021.    Follow-up: No follow-ups on file.   [name], MD

## 2021-02-14 ENCOUNTER — Ambulatory Visit: Payer: Medicare Other | Admitting: Internal Medicine

## 2021-04-12 ENCOUNTER — Other Ambulatory Visit: Payer: Self-pay | Admitting: Internal Medicine

## 2021-05-15 ENCOUNTER — Other Ambulatory Visit: Payer: Self-pay | Admitting: Internal Medicine

## 2021-05-25 NOTE — Progress Notes (Signed)
I have reviewed this visit and agree with the documentation.   

## 2021-05-26 ENCOUNTER — Ambulatory Visit (INDEPENDENT_AMBULATORY_CARE_PROVIDER_SITE_OTHER): Payer: Medicare Other

## 2021-05-26 DIAGNOSIS — Z0000 Encounter for general adult medical examination without abnormal findings: Secondary | ICD-10-CM | POA: Diagnosis not present

## 2021-05-26 NOTE — Progress Notes (Signed)
Subjective:   Brandon Wall is a 80 y.o. male who presents for Medicare Annual/Subsequent preventive examination. I discussed the limitations of evaluation and management by telemedicine and the availability of in person appointments. The patient expressed understanding and agreed to proceed.   Visit performed by audio   Patient location: Home  Provider location: Home  Review of Systems    N/A       Objective:    There were no vitals filed for this visit. There is no height or weight on file to calculate BMI.  Advanced Directives 05/26/2021 12/20/2020 06/24/2020 03/10/2018 08/17/2014  Does Patient Have a Medical Advance Directive? Yes No No Yes Yes  Type of Paramedic of [place];Living will - - [place];Living will [place];Living will  Does patient want to make changes to medical advance directive? No - Patient declined - - No - Patient declined -  Copy of [place] in Chart? - - - No - copy requested No - copy requested    Current Medications (verified) Outpatient Encounter Medications as of 05/26/2021  Medication Sig   ALPRAZolam (XANAX) 0.25 MG tablet TAKE 1 TABLET( 0.25 MG TOTAL) BY MOUTH TWICE DAILY AS NEEDED FOR ANXIETY   apixaban (ELIQUIS) 5 MG TABS tablet Take 5 mg by mouth 2 (two) times daily.   aspirin 81 MG EC tablet Take 81 mg by mouth daily.   fluticasone (FLONASE) 50 MCG/ACT nasal spray Place 2 sprays into the nose daily as needed for allergies or rhinitis.   furosemide (LASIX) 20 MG tablet Take 20 mg by mouth daily.   glipiZIDE (GLUCOTROL XL) 10 MG 24 hr tablet Take 10 mg by mouth daily.   glucose blood test strip    JARDIANCE 25 MG TABS tablet Take 25 mg by mouth daily.   Melatonin 10 MG TABS Take 10 mg by mouth at bedtime.   metFORMIN (GLUCOPHAGE) 500 MG tablet Take 500 mg by mouth 2 (two) times daily.   mexiletine (MEXITIL) 200 MG capsule Take 200 mg by mouth 3 (three) times  daily.   nitroGLYCERIN (NITROSTAT) 0.4 MG SL tablet Place under the tongue.   pramipexole (MIRAPEX) 0.25 MG tablet Take 0.25 mg by mouth at bedtime.   sildenafil (VIAGRA) 50 MG tablet Take 50 mg by mouth as needed for erectile dysfunction.   spironolactone (ALDACTONE) 25 MG tablet Take 12.5 mg by mouth daily.   triamcinolone ointment (KENALOG) 0.1 % Apply 1 application topically 2 (two) times daily as needed. Apply 1-2 times daily as needed for itch to affected areas. Avoid applying to face, groin, and axilla. Use as directed. Risk of skin atrophy with long-term use reviewed.   metoprolol succinate (TOPROL-XL) 100 MG 24 hr tablet Take 50 mg by mouth.    rosuvastatin (CRESTOR) 40 MG tablet Take 40 mg by mouth daily.   zolpidem (AMBIEN) 5 MG tablet TAKE 1 TABLET BY MOUTH DAILY (Patient not taking: Reported on 05/26/2021)   No facility-administered encounter medications on file as of 05/26/2021.    Allergies (verified) Ramipril   History: Past Medical History:  Diagnosis Date   Actinic keratosis    COVID-19 virus infection 05/2020   Dysplastic nevus 08/16/2009   right mid back lat   Heart disease    Hx of basal cell carcinoma 11/11/2012   L superior lateral deltoid   Hx of melanoma in situ 09/11/2012   L spinal upper back   Hx of squamous cell  carcinoma 10/04/2014   Crown scalp, L sideburn   Hypertension    Morbid obesity (HCC)    PAF (paroxysmal atrial fibrillation) (HCC)    Squamous cell carcinoma of skin 01/12/2010   left dorsum hand   Squamous cell carcinoma of skin 10/04/2014   crown scalp   Squamous cell carcinoma of skin 07/14/2015   left sideburn   Type II diabetes mellitus ([place])    Past Surgical History:  Procedure Laterality Date   INSERT / REPLACE / REMOVE PACEMAKER  2010   MELANOMA EXCISION Left 09/24/2012   spinal upper back   Family History  Problem Relation Age of Onset   Heart disease Mother    Hypertension Mother    Heart disease Father     Hypertension Father    Brain cancer Brother    Melanoma Brother    Social History   Socioeconomic History   Marital status: Married    Spouse name: Vaughan Basta   Number of children: Not on file   Years of education: 3 years of college   Highest education level: Associate degree: occupational, Hotel manager, or vocational program  Occupational History   Occupation: Retired  Tobacco Use   Smoking status: Never   Smokeless tobacco: Never  Scientific laboratory technician Use: Never used  Substance and Sexual Activity   Alcohol use: Not Currently   Drug use: Never   Sexual activity: Yes    Birth control/protection: None  Other Topics Concern   Not on file  Social History Narrative   Not on file   Social Determinants of Health   Financial Resource Strain: Low Risk    Difficulty of Paying Living Expenses: Not hard at all  Food Insecurity: No Food Insecurity   Worried About Charity fundraiser in the Last Year: Never true   [place] in the Last Year: Never true  Transportation Needs: No Transportation Needs   Lack of Transportation (Medical): No   Lack of Transportation (Non-Medical): No  Physical Activity: Sufficiently Active   Days of Exercise per Week: 5 days   Minutes of Exercise per Session: 60 min  Stress: No Stress Concern Present   Feeling of Stress : Not at all  Social Connections: Socially Integrated   Frequency of Communication with Friends and Family: More than three times a week   Frequency of Social Gatherings with Friends and Family: More than three times a week   Attends Religious Services: More than 4 times per year   Active Member of [employer] or Organizations: Yes   Attends Music therapist: More than 4 times per year   Marital Status: Married    Tobacco Counseling Counseling given: Not Answered   Clinical Intake:  Pre-visit preparation completed: Yes  Pain : No/denies pain     Diabetes: Yes  How often do you need to have someone help you when  you read instructions, pamphlets, or other written materials from your doctor or pharmacy?: 1 - Never What is the last grade level you completed in school?: 3 years of college  Diabetic?Yes  Interpreter Needed?: No  Information entered by :: Anson [place] CMA   Activities of Daily Living In your present state of health, do you have any difficulty performing the following activities: 05/26/2021  Hearing? N  Vision? N  Difficulty concentrating or making decisions? N  Walking or climbing stairs? N  Dressing or bathing? N  Doing errands, shopping? N  Preparing Food and eating ? N  Using the Toilet? N  In the past six months, have you accidently leaked urine? N  Do you have problems with loss of bowel control? N  Managing your Medications? N  Managing your Finances? N  Housekeeping or managing your Housekeeping? N  Some recent data might be hidden    Patient Care Team: Shirley Friar, MD as PCP - General (Internal Medicine)  Indicate any recent Medical Services you may have received from other than [facility] providers in the past year (date may be approximate).     Assessment:   This is a routine wellness examination for Brandon Wall.  Hearing/Vision screen No results found.  Dietary issues and exercise activities discussed: Current Exercise Habits: Structured exercise class, Time (Minutes): 60, Frequency (Times/Week): 5, Weekly Exercise (Minutes/Week): 300, Intensity: Moderate   Goals Addressed   None    Depression Screen PHQ 2/9 Scores 05/26/2021 07/15/2018 03/10/2018 03/02/2015 11/29/2014  PHQ - 2 Score 0 0 0 0 0  PHQ- 9 Score - 1 3 2  -    Fall Risk Fall Risk  05/26/2021 02/06/2021 03/10/2018 11/29/2014  Falls in the past year? 0 0 No No  Number falls in past yr: 0 0 - -  Injury with Fall? 0 0 - -  Risk for fall due to : No Fall Risks No Fall Risks - -  Follow up Falls evaluation completed Falls evaluation completed - -    FALL RISK PREVENTION PERTAINING TO THE HOME:  Any  stairs in or around the home? Yes  If so, are there any without handrails? No  Home free of loose throw rugs in walkways, pet beds, electrical cords, etc? Yes  Adequate lighting in your home to reduce risk of falls? Yes   ASSISTIVE DEVICES UTILIZED TO PREVENT FALLS:  Life alert? No  Use of a cane, walker or w/c? No  Grab bars in the bathroom? No  Shower chair or bench in shower? No  Elevated toilet seat or a handicapped toilet? No   TIMED UP AND GO:  Was the test performed? No .  Length of time to ambulate 10 feet: 0 sec.     Cognitive Function:     6CIT Screen 05/26/2021  What Year? 0 points  What month? 0 points  What time? 0 points  Count back from 20 0 points  Months in reverse 0 points  Repeat phrase 0 points  Total Score 0    Immunizations Immunization History  Administered Date(s) Administered   Influenza, High Dose Seasonal PF 03/28/2015, 06/12/2016, 03/18/2019   Influenza, Seasonal, Injecte, Preservative Fre 03/11/2014   Influenza-Unspecified 03/26/2012, 03/11/2014, 04/15/2017, 01/09/2021   Pneumococcal Conjugate-13 10/14/2014    TDAP status: Due, Education has been provided regarding the importance of this vaccine. Advised may receive this vaccine at local pharmacy or Health Dept. Aware to provide a copy of the vaccination record if obtained from local pharmacy or Health Dept. Verbalized acceptance and understanding.  Flu Vaccine status: Declined, Education has been provided regarding the importance of this vaccine but patient still declined. Advised may receive this vaccine at local pharmacy or Health Dept. Aware to provide a copy of the vaccination record if obtained from local pharmacy or Health Dept. Verbalized acceptance and understanding.  Pneumococcal vaccine status: Declined,  Education has been provided regarding the importance of this vaccine but patient still declined. Advised may receive this vaccine at local pharmacy or Health Dept. Aware to  provide a copy of the vaccination record if obtained from local pharmacy or  Health Dept. Verbalized acceptance and understanding.   Covid-19 vaccine status: Declined, Education has been provided regarding the importance of this vaccine but patient still declined. Advised may receive this vaccine at local pharmacy or Health Dept.or vaccine clinic. Aware to provide a copy of the vaccination record if obtained from local pharmacy or Health Dept. Verbalized acceptance and understanding.  Qualifies for Shingles Vaccine? Yes   Zostavax completed No   Shingrix Completed?: No.    Education has been provided regarding the importance of this vaccine. Patient has been advised to call insurance company to determine out of pocket expense if they have not yet received this vaccine. Advised may also receive vaccine at local pharmacy or Health Dept. Verbalized acceptance and understanding.  Screening Tests Health Maintenance  Topic Date Due   COVID-19 Vaccine (1) Never done   FOOT EXAM  Never done   OPHTHALMOLOGY EXAM  Never done   TETANUS/TDAP  Never done   Zoster Vaccines- Shingrix (1 of 2) Never done   Pneumonia Vaccine 58+ Years old (2 - PPSV23 if available, else PCV20) 10/14/2015   HEMOGLOBIN A1C  07/07/2021   URINE MICROALBUMIN  01/04/2022   INFLUENZA VACCINE  Completed   HPV VACCINES  Aged Out    Health Maintenance  Health Maintenance Due  Topic Date Due   COVID-19 Vaccine (1) Never done   FOOT EXAM  Never done   OPHTHALMOLOGY EXAM  Never done   TETANUS/TDAP  Never done   Zoster Vaccines- Shingrix (1 of 2) Never done   Pneumonia Vaccine 53+ Years old (2 - PPSV23 if available, else PCV20) 10/14/2015    Colorectal cancer screening: No longer required.   Lung Cancer Screening: (Low Dose CT Chest recommended if Age 44-80 years, 30 pack-year currently smoking OR have quit w/in 15years.) does not qualify.   Lung Cancer Screening Referral: No  Additional Screening:  Hepatitis C Screening:  does not qualify; Completed No  Vision Screening: Recommended annual ophthalmology exams for early detection of glaucoma and other disorders of the eye. Is the patient up to date with their annual eye exam?  No  Who is the provider or what is the name of the office in which the patient attends annual eye exams? [facility] If pt is not established with a provider, would they like to be referred to a provider to establish care? No .   Dental Screening: Recommended annual dental exams for proper oral hygiene  Community Resource Referral / Chronic Care Management: CRR required this visit?  No   CCM required this visit?  No      Plan:     I have personally reviewed and noted the following in the patients chart:   Medical and social history Use of alcohol, tobacco or illicit drugs  Current medications and supplements including opioid prescriptions. Patient is currently taking opioid prescriptions. Information provided to patient regarding non-opioid alternatives. Patient advised to discuss non-opioid treatment plan with their provider. Functional ability and status Nutritional status Physical activity Advanced directives List of other physicians Hospitalizations, surgeries, and ER visits in previous 12 months Vitals Screenings to include cognitive, depression, and falls Referrals and appointments  In addition, I have reviewed and discussed with patient certain preventive protocols, quality metrics, and best practice recommendations. A written personalized care plan for preventive services as well as general preventive health recommendations were provided to patient.     Renato Gails, [place]   05/26/2021   Nurse Notes: Patient was informed of  care gaps, patient refuses all vaccines at this time. Brandon Wall has had a eye exam this year but can not remember the date, he has an upcoming eye exam in January, also patient has a doctor at [facility] that follows his diabetic care, he  plans on making an appointment for his annual diabetic foot exam. No medication refills or referrals were placed today.

## 2021-06-07 NOTE — Progress Notes (Signed)
I have reviewed this visit and agree with the documentation.   

## 2021-06-10 ENCOUNTER — Other Ambulatory Visit: Payer: Self-pay | Admitting: Internal Medicine

## 2021-06-26 ENCOUNTER — Other Ambulatory Visit: Payer: Self-pay

## 2021-06-26 ENCOUNTER — Encounter: Payer: Self-pay | Admitting: Internal Medicine

## 2021-06-26 ENCOUNTER — Ambulatory Visit (INDEPENDENT_AMBULATORY_CARE_PROVIDER_SITE_OTHER): Payer: Medicare Other | Admitting: Internal Medicine

## 2021-06-26 ENCOUNTER — Other Ambulatory Visit: Payer: Self-pay | Admitting: *Deleted

## 2021-06-26 VITALS — BP 105/63 | HR 61 | Ht 75.0 in | Wt 228.9 lb

## 2021-06-26 DIAGNOSIS — I10 Essential (primary) hypertension: Secondary | ICD-10-CM

## 2021-06-26 DIAGNOSIS — F419 Anxiety disorder, unspecified: Secondary | ICD-10-CM

## 2021-06-26 DIAGNOSIS — K2100 Gastro-esophageal reflux disease with esophagitis, without bleeding: Secondary | ICD-10-CM | POA: Diagnosis not present

## 2021-06-26 DIAGNOSIS — E782 Mixed hyperlipidemia: Secondary | ICD-10-CM | POA: Diagnosis not present

## 2021-06-26 MED ORDER — PRAMIPEXOLE DIHYDROCHLORIDE 0.25 MG PO TABS: 0.2500 mg | ORAL_TABLET | Freq: Every day | ORAL | 3 refills | Status: DC

## 2021-06-26 MED ORDER — ALPRAZOLAM 0.25 MG PO TABS: 0.2500 mg | ORAL_TABLET | Freq: Two times a day (BID) | ORAL | 0 refills | Status: DC | PRN

## 2021-06-26 NOTE — Progress Notes (Signed)
Established Patient Office Visit  Subjective:  Patient ID: Brandon Wall, male    DOB: [birth year]  Age: 81 y.o. MRN: [id number]  CC: No chief complaint on file.   HPI  [name] presents for general check up  Past Medical History:  Diagnosis Date   Actinic keratosis    COVID-19 virus infection 05/2020   Dysplastic nevus 08/16/2009   right mid back lat   Heart disease    Hx of basal cell carcinoma 11/11/2012   L superior lateral deltoid   Hx of melanoma in situ 09/11/2012   L spinal upper back   Hx of squamous cell carcinoma 10/04/2014   Crown scalp, L sideburn   Hypertension    Morbid obesity (HCC)    PAF (paroxysmal atrial fibrillation) (HCC)    Squamous cell carcinoma of skin 01/12/2010   left dorsum hand   Squamous cell carcinoma of skin 10/04/2014   crown scalp   Squamous cell carcinoma of skin 07/14/2015   left sideburn   Type II diabetes mellitus ([place])     Past Surgical History:  Procedure Laterality Date   INSERT / REPLACE / REMOVE PACEMAKER  2010   MELANOMA EXCISION Left 09/24/2012   spinal upper back    Family History  Problem Relation Age of Onset   Heart disease Mother    Hypertension Mother    Heart disease Father    Hypertension Father    Brain cancer Brother    Melanoma Brother     Social History   Socioeconomic History   Marital status: Married    Spouse name: Vaughan Basta   Number of children: Not on file   Years of education: 3 years of college   Highest education level: Associate degree: occupational, Hotel manager, or vocational program  Occupational History   Occupation: Retired  Tobacco Use   Smoking status: Never   Smokeless tobacco: Never  Scientific laboratory technician Use: Never used  Substance and Sexual Activity   Alcohol use: Not Currently   Drug use: Never   Sexual activity: Yes    Birth control/protection: None  Other Topics Concern   Not on file  Social History Narrative   Not on file   Social Determinants of Health    Financial Resource Strain: Low Risk    Difficulty of Paying Living Expenses: Not hard at all  Food Insecurity: No Food Insecurity   Worried About Charity fundraiser in the Last Year: Never true   Arboriculturist in the Last Year: Never true  Transportation Needs: No Transportation Needs   Lack of Transportation (Medical): No   Lack of Transportation (Non-Medical): No  Physical Activity: Sufficiently Active   Days of Exercise per Week: 5 days   Minutes of Exercise per Session: 60 min  Stress: No Stress Concern Present   Feeling of Stress : Not at all  Social Connections: Socially Integrated   Frequency of Communication with Friends and Family: More than three times a week   Frequency of Social Gatherings with Friends and Family: More than three times a week   Attends Religious Services: More than 4 times per year   Active Member of [employer] or Organizations: Yes   Attends Music therapist: More than 4 times per year   Marital Status: Married  Human resources officer Violence: Not At Risk   Fear of Current or Ex-Partner: No   Emotionally Abused: No   Physically Abused: No   Sexually Abused:  No     Current Outpatient Medications:    ALPRAZolam (XANAX) 0.25 MG tablet, TAKE 1 TABLET BY MOUTH TWICE DAILY AS NEEDED FOR ANXIETY, Disp: 60 tablet, Rfl: 0   apixaban (ELIQUIS) 5 MG TABS tablet, Take 5 mg by mouth 2 (two) times daily., Disp: , Rfl:    aspirin 81 MG EC tablet, Take 81 mg by mouth daily., Disp: , Rfl:    fluticasone (FLONASE) 50 MCG/ACT nasal spray, Place 2 sprays into the nose daily as needed for allergies or rhinitis., Disp: , Rfl:    furosemide (LASIX) 20 MG tablet, Take 20 mg by mouth daily., Disp: , Rfl:    glipiZIDE (GLUCOTROL XL) 10 MG 24 hr tablet, Take 10 mg by mouth daily., Disp: , Rfl:    glucose blood test strip, , Disp: , Rfl:    JARDIANCE 25 MG TABS tablet, Take 25 mg by mouth daily., Disp: , Rfl:    Melatonin 10 MG TABS, Take 10 mg by mouth at bedtime.,  Disp: , Rfl:    metFORMIN (GLUCOPHAGE) 500 MG tablet, Take 500 mg by mouth 2 (two) times daily., Disp: , Rfl:    metoprolol succinate (TOPROL-XL) 100 MG 24 hr tablet, Take 50 mg by mouth. , Disp: , Rfl:    mexiletine (MEXITIL) 200 MG capsule, Take 200 mg by mouth 3 (three) times daily., Disp: , Rfl:    nitroGLYCERIN (NITROSTAT) 0.4 MG SL tablet, Place under the tongue., Disp: , Rfl:    pramipexole (MIRAPEX) 0.25 MG tablet, Take 1 tablet (0.25 mg total) by mouth at bedtime., Disp: 90 tablet, Rfl: 3   rosuvastatin (CRESTOR) 40 MG tablet, Take 40 mg by mouth daily., Disp: , Rfl:    sildenafil (VIAGRA) 50 MG tablet, Take 50 mg by mouth as needed for erectile dysfunction., Disp: , Rfl:    spironolactone (ALDACTONE) 25 MG tablet, Take 12.5 mg by mouth daily., Disp: , Rfl: 10   triamcinolone ointment (KENALOG) 0.1 %, Apply 1 application topically 2 (two) times daily as needed. Apply 1-2 times daily as needed for itch to affected areas. Avoid applying to face, groin, and axilla. Use as directed. Risk of skin atrophy with long-term use reviewed., Disp: 80 g, Rfl: 4   zolpidem (AMBIEN) 5 MG tablet, TAKE 1 TABLET BY MOUTH DAILY (Patient not taking: Reported on 05/26/2021), Disp: 90 tablet, Rfl: 0   Allergies  Allergen Reactions   Ramipril     Other reaction(s): Cough    ROS Review of Systems  Constitutional: Negative.   HENT: Negative.    Eyes: Negative.   Respiratory: Negative.    Cardiovascular: Negative.   Gastrointestinal: Negative.   Endocrine: Negative.   Genitourinary: Negative.   Musculoskeletal: Negative.   Skin: Negative.   Allergic/Immunologic: Negative.   Neurological: Negative.   Hematological: Negative.   Psychiatric/Behavioral: Negative.    All other systems reviewed and are negative.    Objective:    Physical Exam Vitals reviewed.  Constitutional:      Appearance: Normal appearance.  HENT:     Mouth/Throat:     Mouth: Mucous membranes are moist.  Eyes:     Pupils:  Pupils are equal, round, and reactive to light.  Neck:     Vascular: No carotid bruit.  Cardiovascular:     Rate and Rhythm: Normal rate and regular rhythm.     Pulses: Normal pulses.     Heart sounds: Normal heart sounds.  Pulmonary:     Effort: Pulmonary effort is normal.  Breath sounds: Normal breath sounds.  Abdominal:     General: Bowel sounds are normal.     Palpations: Abdomen is soft. There is no hepatomegaly, splenomegaly or mass.     Tenderness: There is no abdominal tenderness.     Hernia: No hernia is present.  Musculoskeletal:     Cervical back: Neck supple.     Right lower leg: No edema.     Left lower leg: No edema.  Skin:    Findings: No rash.  Neurological:     Mental Status: He is alert and oriented to person, place, and time.     Motor: No weakness.  Psychiatric:        Mood and Affect: Mood normal.        Behavior: Behavior normal.    BP 105/63    Pulse 61    Ht 6\' 3"  (1.905 m)    Wt 228 lb 14.4 oz (103.8 kg)    BMI 28.61 kg/m  Wt Readings from Last 3 Encounters:  06/26/21 228 lb 14.4 oz (103.8 kg)  02/06/21 228 lb 11.2 oz (103.7 kg)  12/20/20 224 lb 13.9 oz (102 kg)     Health Maintenance Due  Topic Date Due   COVID-19 Vaccine (1) Never done   FOOT EXAM  Never done   OPHTHALMOLOGY EXAM  Never done    There are no preventive care reminders to display for this patient.  No results found for: TSH Lab Results  Component Value Date   WBC 6.2 12/20/2020   HGB 12.0 (L) 12/20/2020   HCT 35.8 (L) 12/20/2020   MCV 94.5 12/20/2020   PLT 149 (L) 12/20/2020   Lab Results  Component Value Date   NA 135 12/20/2020   K 4.1 12/20/2020   CO2 25 12/20/2020   GLUCOSE 193 (H) 12/20/2020   BUN 37 (H) 12/20/2020   CREATININE 2.35 (H) 12/20/2020   BILITOT 0.6 09/10/2014   ALKPHOS 59 09/10/2014   AST 22 09/10/2014   ALT 17 09/10/2014   PROT 7.7 09/10/2014   ALBUMIN 4.5 09/10/2014   CALCIUM 8.8 (L) 12/20/2020   ANIONGAP 6 12/20/2020   No results  found for: CHOL No results found for: HDL No results found for: LDLCALC No results found for: TRIG No results found for: CHOLHDL Lab Results  Component Value Date   HGBA1C 9 01/04/2021   HGBA1C 9.2 01/04/2021      Assessment & Plan:   Problem List Items Addressed This Visit       Cardiovascular and Mediastinum   Hypertension - Primary     Patient denies any chest pain or shortness of breath there is no history of palpitation or paroxysmal nocturnal dyspnea   patient was advised to follow low-salt low-cholesterol diet    ideally I want to keep systolic blood pressure below 130 mmHg, patient was asked to check blood pressure one times a week and give me a report on that.  Patient will be follow-up in 3 months  or earlier as needed, patient will call me back for any change in the cardiovascular symptoms Patient was advised to buy a book from local bookstore concerning blood pressure and read several chapters  every day.  This will be supplemented by some of the material we will give him from the office.  Patient should also utilize other resources like YouTube and Internet to learn more about the blood pressure and the diet.        Digestive  GERD (gastroesophageal reflux disease)    - The patient's GERD is stable on medication.  - Instructed the patient to avoid eating spicy and acidic foods, as well as foods high in fat. - Instructed the patient to avoid eating large meals or meals 2-3 hours prior to sleeping.        Other   HLD (hyperlipidemia)    Hypercholesterolemia  I advised the patient to follow Mediterranean diet This diet is rich in fruits vegetables and whole grain, and This diet is also rich in fish and lean meat Patient should also eat a handful of almonds or walnuts daily Recent heart study indicated that average follow-up on this kind of diet reduces the cardiovascular mortality by 50 to 70%==      Anxiety    - Patient experiencing high levels of anxiety.   - Encouraged patient to engage in relaxing activities like yoga, meditation, journaling, going for a walk, or participating in a hobby.  - Encouraged patient to reach out to trusted friends or family members about recent struggles, Patient was advised to read A book, how to stop worrying and start living, it is good book to read to control  the stress        Meds ordered this encounter  Medications   pramipexole (MIRAPEX) 0.25 MG tablet    Sig: Take 1 tablet (0.25 mg total) by mouth at bedtime.    Dispense:  90 tablet    Refill:  3    Follow-up: No follow-ups on file.    [name], MD

## 2021-06-26 NOTE — Assessment & Plan Note (Signed)
-   The patient's GERD is stable on medication.  - Instructed the patient to avoid eating spicy and acidic foods, as well as foods high in fat. - Instructed the patient to avoid eating large meals or meals 2-3 hours prior to sleeping. 

## 2021-06-26 NOTE — Assessment & Plan Note (Signed)
-   Patient experiencing high levels of anxiety.  - Encouraged patient to engage in relaxing activities like yoga, meditation, journaling, going for a walk, or participating in a hobby.  - Encouraged patient to reach out to trusted friends or family members about recent struggles, Patient was advised to read A book, how to stop worrying and start living, it is good book to read to control  the stress  

## 2021-06-26 NOTE — Assessment & Plan Note (Signed)
Hypercholesterolemia  I advised the patient to follow Mediterranean diet This diet is rich in fruits vegetables and whole grain, and This diet is also rich in fish and lean meat Patient should also eat a handful of almonds or walnuts daily Recent heart study indicated that average follow-up on this kind of diet reduces the cardiovascular mortality by 50 to 70%== 

## 2021-06-26 NOTE — Assessment & Plan Note (Signed)

## 2021-07-21 ENCOUNTER — Other Ambulatory Visit: Payer: Self-pay | Admitting: *Deleted

## 2021-07-21 MED ORDER — ALPRAZOLAM 0.25 MG PO TABS: 0.2500 mg | ORAL_TABLET | Freq: Two times a day (BID) | ORAL | 0 refills | Status: DC | PRN

## 2021-07-25 ENCOUNTER — Ambulatory Visit (INDEPENDENT_AMBULATORY_CARE_PROVIDER_SITE_OTHER): Payer: Medicare Other | Admitting: Dermatology

## 2021-07-25 ENCOUNTER — Other Ambulatory Visit: Payer: Self-pay

## 2021-07-25 DIAGNOSIS — D492 Neoplasm of unspecified behavior of bone, soft tissue, and skin: Secondary | ICD-10-CM

## 2021-07-25 DIAGNOSIS — L570 Actinic keratosis: Secondary | ICD-10-CM

## 2021-07-25 DIAGNOSIS — L820 Inflamed seborrheic keratosis: Secondary | ICD-10-CM | POA: Diagnosis not present

## 2021-07-25 DIAGNOSIS — L578 Other skin changes due to chronic exposure to nonionizing radiation: Secondary | ICD-10-CM | POA: Diagnosis not present

## 2021-07-25 DIAGNOSIS — L509 Urticaria, unspecified: Secondary | ICD-10-CM

## 2021-07-25 HISTORY — DX: Actinic keratosis: L57.0

## 2021-07-25 NOTE — Patient Instructions (Addendum)
If You Need Anything After Your Visit ° °If you have any questions or concerns for your doctor, please call our main line at [phone] and press option 4 to reach your doctor's medical assistant. If no one answers, please leave a voicemail as directed and we will return your call as soon as possible. Messages left after 4 pm will be answered the following business day.  ° °You may also send us a message via MyChart. We typically respond to MyChart messages within 1-2 business days. ° °For prescription refills, please ask your pharmacy to contact our office. Our fax number is [phone]. ° °If you have an urgent issue when the clinic is closed that cannot wait until the next business day, you can page your doctor at the number below.   ° °Please note that while we do our best to be available for urgent issues outside of office hours, we are not available 24/7.  ° °If you have an urgent issue and are unable to reach us, you may choose to seek medical care at your doctor's office, retail clinic, urgent care center, or emergency room. ° °If you have a medical emergency, please immediately call 911 or go to the emergency department. ° °Pager Numbers ° °- Dr. Kowalski: [phone] ° °- Dr. Moye: [phone] ° °- Dr. Stewart: [phone] ° °In the event of inclement weather, please call our main line at [phone] for an update on the status of any delays or closures. ° °Dermatology Medication Tips: °Please keep the boxes that topical medications come in in order to help keep track of the instructions about where and how to use these. Pharmacies typically print the medication instructions only on the boxes and not directly on the medication tubes.  ° °If your medication is too expensive, please contact our office at [phone] option 4 or send us a message through MyChart.  ° °We are unable to tell what your co-pay for medications will be in advance as this is different depending on your insurance coverage.  However, we may be able to find a substitute medication at lower cost or fill out paperwork to get insurance to cover a needed medication.  ° °If a prior authorization is required to get your medication covered by your insurance company, please allow us 1-2 business days to complete this process. ° °Drug prices often vary depending on where the prescription is filled and some pharmacies may offer cheaper prices. ° °The website www.goodrx.com contains coupons for medications through different pharmacies. The prices here do not account for what the cost may be with help from insurance (it may be cheaper with your insurance), but the website can give you the price if you did not use any insurance.  °- You can print the associated coupon and take it with your prescription to the pharmacy.  °- You may also stop by our office during regular business hours and pick up a GoodRx coupon card.  °- If you need your prescription sent electronically to a different pharmacy, notify our office through [facility] MyChart or by phone at [phone] option 4. ° ° ° ° °Si Usted Necesita Algo Después de Su Visita ° °También puede enviarnos un mensaje a través de MyChart. Por lo general respondemos a los mensajes de MyChart en el transcurso de 1 a 2 días hábiles. ° °Para renovar recetas, por favor pida a su farmacia que se ponga en contacto con nuestra oficina. Nuestro número de fax es el [phone]. ° °Si tiene   un asunto urgente cuando la clnica est cerrada y que no puede esperar hasta el siguiente da hbil, puede llamar/localizar a su doctor(a) al nmero que aparece a continuacin.   Por favor, tenga en cuenta que aunque hacemos todo lo posible para estar disponibles para asuntos urgentes fuera del horario de [place], no estamos disponibles las 24 horas del da, los 7 das de la [place].   Si tiene un problema urgente y no puede comunicarse con nosotros, puede optar por buscar atencin mdica  en el consultorio de su  doctor(a), en una clnica privada, en un centro de atencin urgente o en una sala de emergencias.  Si tiene Engineering geologist, por favor llame inmediatamente al 911 o vaya a la sala de emergencias.  Nmeros de bper  - Dr. Nehemiah Massed: [phone]  - Dra. Moye: [phone]  - Dra. Nicole Kindred: [phone]  En caso de inclemencias del [place], por favor llame a Johnsie Kindred principal al [phone] para una actualizacin sobre el [place] de cualquier retraso o cierre.  Consejos para la medicacin en dermatologa: Por favor, guarde las cajas en las que vienen los medicamentos de uso tpico para ayudarle a seguir las instrucciones sobre dnde y cmo usarlos. Las farmacias generalmente imprimen las instrucciones del medicamento slo en las cajas y no directamente en los tubos del [place].   Si su medicamento es muy caro, por favor, pngase en contacto con Zigmund Daniel llamando al [phone] y presione la opcin 4 o envenos un mensaje a travs de Pharmacist, community.   No podemos decirle cul ser su copago por los medicamentos por adelantado ya que esto es diferente dependiendo de la cobertura de su seguro. Sin embargo, es posible que podamos encontrar un medicamento sustituto a Electrical engineer un formulario para que el seguro cubra el medicamento que se considera necesario.   Si se requiere una autorizacin previa para que su compaa de seguros [place] su medicamento, por favor permtanos de 1 a 2 das hbiles para completar este proceso.  Los precios de los medicamentos varan con frecuencia dependiendo del Environmental consultant de dnde se surte la receta y alguna farmacias pueden ofrecer precios ms baratos.  El sitio web www.goodrx.com tiene cupones para medicamentos de Airline pilot. Los precios aqu no tienen en cuenta lo que podra costar con la ayuda del seguro (puede ser ms barato con su seguro), pero el sitio web puede darle el precio si no utiliz Research scientist (physical sciences).  - Puede imprimir el cupn  correspondiente y llevarlo con su receta a la farmacia.  - Tambin puede pasar por nuestra oficina durante el horario de atencin regular y Charity fundraiser una tarjeta de cupones de GoodRx.  - Si necesita que su receta se enve electrnicamente a una farmacia diferente, informe a nuestra oficina a travs de MyChart de [place] o por telfono llamando al [phone] y presione la opcin 4.   Start Allegra 180mg  or Claritan 1 pill a day and if not improving may increase to 2 pills a day and if not improving after 2 a day may increase to 3 pills a day and if still not improving may increase up to 4 a day.  Wound Care Instructions  Cleanse wound gently with soap and water once a day then pat dry with clean gauze. Apply a thing coat of Petrolatum (petroleum jelly, "Vaseline") over the wound (unless you have an allergy to this). We recommend that you use a new, sterile tube of Vaseline. Do not pick or remove scabs. Do not remove  the yellow or white "healing tissue" from the base of the wound.  Cover the wound with fresh, clean, nonstick gauze and secure with paper tape. You may use Band-Aids in place of gauze and tape if the would is small enough, but would recommend trimming much of the tape off as there is often too much. Sometimes Band-Aids can irritate the skin.  You should call the office for your biopsy report after 1 week if you have not already been contacted.  If you experience any problems, such as abnormal amounts of bleeding, swelling, significant bruising, significant pain, or evidence of infection, please call the office immediately.  FOR ADULT SURGERY PATIENTS: If you need something for pain relief you may take 1 extra strength Tylenol (acetaminophen) AND 2 Ibuprofen (200mg  each) together every 4 hours as needed for pain. (do not take these if you are allergic to them or if you have a reason you should not take them.) Typically, you may only need pain medication for 1 to 3 days.

## 2021-07-25 NOTE — Progress Notes (Signed)
Follow-Up Visit   Subjective  Brandon Wall is a 81 y.o. male who presents for the following: check spot not healing from 04/22 LN2 (L ant lat ankle, using neosporin and not healing, pt thinks the same spot an ISK was txted in 09/2020) and check itchy spot (Back, itchy).  He has long history of hives that come and go.  He is not taking anything for it at this time. The patient has spots, moles and lesions to be evaluated, some may be new or changing and the patient has concerns that these could be cancer.  The following portions of the chart were reviewed this encounter and updated as appropriate:   Tobacco   Allergies   Meds   Problems   Med Hx   Surg Hx   Fam Hx      Review of Systems:  No other skin or systemic complaints except as noted in HPI or Assessment and Plan.  Objective  Well appearing patient in no apparent distress; mood and affect are within normal limits.  A focused examination was performed including L ankle, back. Relevant physical exam findings are noted in the Assessment and Plan.  L med pretibial 1.7cm ulcer    back x 1 Stuck on waxy paps with erythema  trunk Clear today  Assessment & Plan  Neoplasm of skin L med pretibial Skin / nail biopsy Type of biopsy: tangential   Informed consent: discussed and consent obtained   Timeout: patient name, date of birth, surgical site, and procedure verified   Procedure prep:  Patient was prepped and draped in usual sterile fashion Prep type:  Isopropyl alcohol Anesthesia: the lesion was anesthetized in a standard fashion   Anesthetic:  1% lidocaine w/ epinephrine 1-100,000 buffered w/ 8.4% NaHCO3 Instrument used: flexible razor blade   Outcome: patient tolerated procedure well   Post-procedure details: sterile dressing applied and wound care instructions given   Dressing type: bandage and bacitracin    Specimen 1 - Surgical pathology Differential Diagnosis: D48.5 R/O Cancer Check Margins: No 1.7cm  ulcer  Inflamed seborrheic keratosis back x 1 Destruction of lesion - back x 1 Complexity: simple   Destruction method: cryotherapy   Informed consent: discussed and consent obtained   Timeout:  patient name, date of birth, surgical site, and procedure verified Lesion destroyed using liquid nitrogen: Yes   Region frozen until ice ball extended beyond lesion: Yes   Outcome: patient tolerated procedure well with no complications   Post-procedure details: wound care instructions given    Urticaria trunk Chronic and persistent condition with duration or expected duration over one year. Condition is symptomatic / bothersome to patient. Not to goal.  Urticaria or hives is a pink to red patchy whelp- like rash of the skin that typically itches and it is the result of histamine release in the skin.   Hives may have multiple causes including stress, medications, infections, and systemic illness.  Sometimes there is a family history of chronic urticaria.   "Physical urticarias" may be caused by heat, sun, cold, vibration.   Insect bites can cause "papular urticaria". It is often difficult to find the cause of generalized hives.  Statistically, 70% of the time a cause of generalized hives is not found.  Sometimes hives can spontaneously resolve. Other times hives can persist and when it does, and no cause is found, and it has been at least 6 weeks since started, it is called "chronic idiopathic urticaria".   Start Allegra 180mg   or Claritin 1 po qd and if not improving may increase to 2 a day and if not improving after 2 a day may increase to 3 po qd and if still not improving may increase up to 4 a day Since he does not have hives every day he can just start a regimen of oral antihistamines when he is having trouble.  Recommend pt talking to his nephrologist, his hives may be related to his kidney issues.  Actinic Damage - chronic, secondary to cumulative UV radiation exposure/sun exposure over  time - diffuse scaly erythematous macules with underlying dyspigmentation - Recommend daily broad spectrum sunscreen SPF 30+ to sun-exposed areas, reapply every 2 hours as needed.  - Recommend staying in the shade or wearing long sleeves, sun glasses (UVA+UVB protection) and wide brim hats (4-inch brim around the entire circumference of the hat). - Call for new or changing lesions.  Return for as scheduled for TBSE.  I, Othelia Pulling, RMA, am acting as scribe for Sarina Ser, MD . Documentation: I have reviewed the above documentation for accuracy and completeness, and I agree with the above.  Sarina Ser, MD

## 2021-07-26 ENCOUNTER — Telehealth: Payer: Self-pay

## 2021-07-26 NOTE — Telephone Encounter (Signed)
Advised patient of results/hd  

## 2021-07-26 NOTE — Telephone Encounter (Signed)
-----   Message from Ralene Bathe, MD sent at 07/26/2021  3:34 PM EST ----- Diagnosis Skin , left med pretibial HYPERTROPHIC ACTINIC KERATOSIS (DEEPER UNDERLYING INVASION NOT ENTIRELY EXCLUDED  PreCancer Schedule for treatment (EDC)

## 2021-07-28 ENCOUNTER — Encounter: Payer: Self-pay | Admitting: Dermatology

## 2021-10-04 ENCOUNTER — Encounter: Payer: Self-pay | Admitting: Dermatology

## 2021-10-04 ENCOUNTER — Ambulatory Visit (INDEPENDENT_AMBULATORY_CARE_PROVIDER_SITE_OTHER): Payer: Medicare Other | Admitting: Dermatology

## 2021-10-04 DIAGNOSIS — L821 Other seborrheic keratosis: Secondary | ICD-10-CM

## 2021-10-04 DIAGNOSIS — Z1283 Encounter for screening for malignant neoplasm of skin: Secondary | ICD-10-CM | POA: Diagnosis not present

## 2021-10-04 DIAGNOSIS — L570 Actinic keratosis: Secondary | ICD-10-CM | POA: Diagnosis not present

## 2021-10-04 DIAGNOSIS — Z85828 Personal history of other malignant neoplasm of skin: Secondary | ICD-10-CM | POA: Diagnosis not present

## 2021-10-04 DIAGNOSIS — L578 Other skin changes due to chronic exposure to nonionizing radiation: Secondary | ICD-10-CM | POA: Diagnosis not present

## 2021-10-04 DIAGNOSIS — Z86006 Personal history of melanoma in-situ: Secondary | ICD-10-CM | POA: Diagnosis not present

## 2021-10-04 DIAGNOSIS — D692 Other nonthrombocytopenic purpura: Secondary | ICD-10-CM

## 2021-10-04 DIAGNOSIS — Z872 Personal history of diseases of the skin and subcutaneous tissue: Secondary | ICD-10-CM

## 2021-10-04 DIAGNOSIS — D1800 Hemangioma unspecified site: Secondary | ICD-10-CM

## 2021-10-04 DIAGNOSIS — L814 Other melanin hyperpigmentation: Secondary | ICD-10-CM

## 2021-10-04 DIAGNOSIS — D229 Melanocytic nevi, unspecified: Secondary | ICD-10-CM

## 2021-10-04 NOTE — Patient Instructions (Addendum)

## 2021-10-04 NOTE — Progress Notes (Signed)
? ?Follow-Up Visit ?  ?Subjective  ?Brandon Wall is a 81 y.o. 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 ?The patient presents for Total-Body Skin Exam (TBSE) for skin cancer screening and mole check.  The patient has spots, moles and lesions to be evaluated, some may be new or changing and the patient has concerns that these could be cancer.  ? ?The following portions of the chart were reviewed this encounter and updated as appropriate:  ? Tobacco  Allergies  Meds  Problems  Med Hx  Surg Hx  Fam Hx   ?  ?Review of Systems:  No other skin or systemic complaints except as noted in HPI or Assessment and Plan. ? ?Objective  ?Well appearing patient in no apparent distress; mood and affect are within normal limits. ? ?A full examination was performed including scalp, head, eyes, ears, nose, lips, neck, chest, axillae, abdomen, back, buttocks, bilateral upper extremities, bilateral lower extremities, hands, feet, fingers, toes, fingernails, and toenails. All findings within normal limits unless otherwise noted below. ? ?L spinal upper back ?Well healed scar with no evidence of recurrence, no lymphadenopathy.  ? ?forehead x 3 (3) ?Pink scaly macules ? ?L med pretibial x 1 ?Pink bx site ? ? ?Assessment & Plan  ? ?Lentigines ?- Scattered tan macules ?- Due to sun exposure ?- Benign-appearing, observe ?- Recommend daily broad spectrum sunscreen SPF 30+ to sun-exposed areas, reapply every 2 hours as needed. ?- Call for any changes ? ?Seborrheic Keratoses ?- Stuck-on, waxy, tan-brown papules and/or plaques  ?- Benign-appearing ?- Discussed benign etiology and prognosis. ?- Observe ?- Call for any changes ? ?Melanocytic Nevi ?- Tan-brown and/or pink-flesh-colored symmetric macules and papules ?- Benign appearing on exam today ?-  Observation ?- Call clinic for new or changing moles ?- Recommend daily use of broad spectrum spf 30+ sunscreen to sun-exposed areas.  ? ?Hemangiomas ?- Red papules ?- Discussed benign nature ?- Observe ?- Call for any changes ? ?Actinic Damage ?- Chronic condition, secondary to cumulative UV/sun exposure ?- diffuse scaly erythematous macules with underlying dyspigmentation ?- Recommend daily broad spectrum sunscreen SPF 30+ to sun-exposed areas, reapply every 2 hours as needed.  ?- Staying in the shade or wearing long sleeves, sun glasses (UVA+UVB protection) and wide brim hats (4-inch brim around the entire circumference of the hat) are also recommended for sun protection.  ?- Call for new or changing lesions. ? ?Skin cancer screening performed today. ? ?History of Basal Cell Carcinoma of the Skin ?- No evidence of recurrence today ?- Recommend regular full body skin exams ?- Recommend daily broad spectrum sunscreen SPF 30+ to sun-exposed areas, reapply every 2 hours as needed.  ?- Call if any new or changing lesions are noted between office visits  ?- L sup lat deltoid ? ?History of Squamous Cell Carcinoma of the Skin ?- No evidence of recurrence today ?- No lymphadenopathy ?- Recommend regular full body skin exams ?- Recommend daily broad spectrum sunscreen SPF 30+ to sun-exposed areas, reapply every 2 hours as needed.  ?- Call if any new or changing lesions are noted between office visits ? ? Purpura - Chronic; persistent and recurrent.  Treatable, but not curable. ?- Violaceous macules and patches ?- Benign ?- Related to trauma, age, sun damage and/or use  of blood thinners, chronic use of topical and/or oral steroids ?- Observe ?- Can use OTC arnica containing moisturizer such as Dermend Bruise Formula if desired ?- Call for worsening or other concerns  ? 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  No lymphadenopathy.  Observe for recurrence. Call clinic for new or changing  lesions.  Recommend regular skin exams, daily broad-spectrum spf 30+ sunscreen use, and photoprotection.   ? ?AK (actinic keratosis) (3) ?forehead x 3 ?Destruction of lesion - forehead x 3 ?Complexity: simple   ?Destruction method: cryotherapy   ?Informed consent: discussed and consent obtained   ?Timeout:  patient name, date of birth, surgical site, and procedure verified ?Lesion destroyed using liquid nitrogen: Yes   ?Region frozen until ice ball extended beyond lesion: Yes   ?Outcome: patient tolerated procedure well with no complications   ?Post-procedure details: wound care instructions given   ? 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 ?Return in about 6 months (around 04/05/2022) for UBSE, Recheck Hypertrophic AK L med pretibial, AK f/u. ? ?I, Othelia Pulling, RMA, am acting as scribe for Sarina Ser, MD . ?Documentation: I have reviewed the above documentation for accuracy and completeness, and I agree with the above. ? ?Sarina Ser, MD ? ?

## 2021-10-11 ENCOUNTER — Encounter: Payer: Self-pay | Admitting: Dermatology

## 2021-10-21 ENCOUNTER — Other Ambulatory Visit: Payer: Self-pay | Admitting: Internal Medicine

## 2022-01-01 ENCOUNTER — Other Ambulatory Visit: Payer: Self-pay

## 2022-01-09 ENCOUNTER — Other Ambulatory Visit: Payer: Self-pay | Admitting: *Deleted

## 2022-01-09 MED ORDER — PRAMIPEXOLE DIHYDROCHLORIDE 0.75 MG PO TABS: 0.7500 mg | ORAL_TABLET | Freq: Three times a day (TID) | ORAL | 1 refills | Status: AC

## 2022-04-09 ENCOUNTER — Encounter: Payer: Self-pay | Admitting: Dermatology

## 2022-04-09 ENCOUNTER — Ambulatory Visit (INDEPENDENT_AMBULATORY_CARE_PROVIDER_SITE_OTHER): Payer: Medicare Other | Admitting: Dermatology

## 2022-04-09 DIAGNOSIS — Z79899 Other long term (current) drug therapy: Secondary | ICD-10-CM

## 2022-04-09 DIAGNOSIS — Z85828 Personal history of other malignant neoplasm of skin: Secondary | ICD-10-CM

## 2022-04-09 DIAGNOSIS — L570 Actinic keratosis: Secondary | ICD-10-CM | POA: Diagnosis not present

## 2022-04-09 DIAGNOSIS — D229 Melanocytic nevi, unspecified: Secondary | ICD-10-CM

## 2022-04-09 DIAGNOSIS — L509 Urticaria, unspecified: Secondary | ICD-10-CM

## 2022-04-09 DIAGNOSIS — Z5111 Encounter for antineoplastic chemotherapy: Secondary | ICD-10-CM

## 2022-04-09 DIAGNOSIS — Z86006 Personal history of melanoma in-situ: Secondary | ICD-10-CM

## 2022-04-09 DIAGNOSIS — Z1283 Encounter for screening for malignant neoplasm of skin: Secondary | ICD-10-CM

## 2022-04-09 DIAGNOSIS — Z8589 Personal history of malignant neoplasm of other organs and systems: Secondary | ICD-10-CM

## 2022-04-09 DIAGNOSIS — Z86018 Personal history of other benign neoplasm: Secondary | ICD-10-CM

## 2022-04-09 DIAGNOSIS — L720 Epidermal cyst: Secondary | ICD-10-CM | POA: Diagnosis not present

## 2022-04-09 DIAGNOSIS — Z85820 Personal history of malignant melanoma of skin: Secondary | ICD-10-CM

## 2022-04-09 DIAGNOSIS — L814 Other melanin hyperpigmentation: Secondary | ICD-10-CM

## 2022-04-09 DIAGNOSIS — L821 Other seborrheic keratosis: Secondary | ICD-10-CM

## 2022-04-09 DIAGNOSIS — L578 Other skin changes due to chronic exposure to nonionizing radiation: Secondary | ICD-10-CM

## 2022-04-09 NOTE — Patient Instructions (Signed)
Instructions for [facility]  One or more of your medications was sent to the [facility] mail order compounding pharmacy. You will receive an email from them and can purchase the medicine through that link. It will then be mailed to your home at the address you confirmed. If for any reason you do not receive an email from them, please check your spam folder. If you still do not find the email, please let us know. [facility] phone number is [phone].    Urticaria or hives is a pink to red patchy whelp- like rash of the skin that typically itches and it is the result of histamine release in the skin.   Hives may have multiple causes including stress, medications, infections, and systemic illness.  Sometimes there is a family history of chronic urticaria.   "Physical urticarias" may be caused by heat, sun, cold, vibration.   Insect bites can cause "papular urticaria". It is often difficult to find the cause of generalized hives.  Statistically, 70% of the time a cause of generalized hives is not found.  Sometimes hives can spontaneously resolve. Other times hives can persist and when it does, and no cause is found, and it has been at least 6 weeks since started, it is called "chronic idiopathic urticaria". Antihistamines are the mainstay for treatment.  In severe cases Xolair injections may be used.  Recommend starting Claritin or Allegra daily. May increase up to 4 daily as needed.

## 2022-04-09 NOTE — Progress Notes (Unsigned)
Follow-Up Visit   Subjective  Brandon Wall is a 81 y.o. male who presents for the following: Annual Exam (Upper body skin exam. HxMIS, HxBCC, HxSCC, HxAKs. C/O probable cyst on back, wife has noticed). The patient presents for Upper Body Skin Exam (UBSE) for skin cancer screening and mole check.  The patient has spots, moles and lesions to be evaluated, some may be new or changing and the patient has concerns that these could be cancer.  The following portions of the chart were reviewed this encounter and updated as appropriate:  Tobacco  Allergies  Meds  Problems  Med Hx  Surg Hx  Fam Hx     Review of Systems: No other skin or systemic complaints except as noted in HPI or Assessment and Plan.  Objective  Well appearing patient in no apparent distress; mood and affect are within normal limits.  All skin waist up examined.  Left mid back paraspinal 1.0 cm Subcutaneous nodule.   Face (2) Erythematous thin papules/macules with gritty scale.    Assessment & Plan   History of Melanoma in Situ - No evidence of recurrence today - No lymphadenopathy  - Recommend regular full body skin exams - Recommend daily broad spectrum sunscreen SPF 30+ to sun-exposed areas, reapply every 2 hours as needed.  - Call if any new or changing lesions are noted between office visits   History of Squamous Cell Carcinoma of the Skin - No evidence of recurrence today - No lymphadenopathy - Recommend regular full body skin exams - Recommend daily broad spectrum sunscreen SPF 30+ to sun-exposed areas, reapply every 2 hours as needed.  - Call if any new or changing lesions are noted between office visits  History of Basal Cell Carcinoma of the Skin - No evidence of recurrence today - Recommend regular full body skin exams - Recommend daily broad spectrum sunscreen SPF 30+ to sun-exposed areas, reapply every 2 hours as needed.  - Call if any new or changing lesions are noted between office  visits   History of Dysplastic Nevus. 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 - No evidence of recurrence today - Recommend regular full body skin exams - Recommend daily broad spectrum sunscreen SPF 30+ to sun-exposed areas, reapply every 2 hours as needed.  - Call if any new or changing lesions are noted between office visits   Epidermal inclusion cyst Left mid back paraspinal Benign-appearing. Exam most consistent with an epidermal inclusion cyst. Discussed that a cyst is a benign growth that can grow over time and sometimes get irritated or inflamed. Recommend observation if it is not bothersome. Discussed option of surgical excision to remove it if it is growing, symptomatic, or other changes noted. Please call for new or changing lesions so they can be evaluated.  AK (actinic keratosis) Face Actinic keratoses are precancerous spots that appear secondary to cumulative UV radiation exposure/sun exposure over time. They are chronic with expected duration over 1 year. A portion of actinic keratoses will progress to squamous cell carcinoma of the skin. It is not possible to reliably predict which spots will progress to skin cancer and so treatment is recommended to prevent development of skin cancer.  Recommend daily broad spectrum sunscreen SPF 30+ to sun-exposed areas, reapply every 2 hours as needed.  Recommend staying in the shade or wearing long sleeves, sun glasses (UVA+UVB protection) and wide brim hats (4-inch brim around the entire circumference of the hat). Call for new or changing lesions.  Actinic Damage with  PreCancerous Actinic Keratoses Counseling for Topical Chemotherapy Management: Patient exhibits: - Severe, confluent actinic changes with pre-cancerous actinic keratoses that is secondary to cumulative UV radiation exposure over time - Condition that is severe; chronic, not at goal. - diffuse scaly erythematous macules and papules with underlying dyspigmentation - Discussed  Prescription "Field Treatment" topical Chemotherapy for Severe, Chronic Confluent Actinic Changes with Pre-Cancerous Actinic Keratoses Field treatment involves treatment of an entire area of skin that has confluent Actinic Changes (Sun/ Ultraviolet light damage) and PreCancerous Actinic Keratoses by method of PhotoDynamic Therapy (PDT) and/or prescription Topical Chemotherapy agents such as 5-fluorouracil, 5-fluorouracil/calcipotriene, and/or imiquimod.  The purpose is to decrease the number of clinically evident and subclinical PreCancerous lesions to prevent progression to development of skin cancer by chemically destroying early precancer changes that may or may not be visible.  It has been shown to reduce the risk of developing skin cancer in the treated area. As a result of treatment, redness, scaling, crusting, and open sores may occur during treatment course. One or more than one of these methods may be used and may have to be used several times to control, suppress and eliminate the PreCancerous changes. Discussed treatment course, expected reaction, and possible side effects. - Recommend daily broad spectrum sunscreen SPF 30+ to sun-exposed areas, reapply every 2 hours as needed.  - Staying in the shade or wearing long sleeves, sun glasses (UVA+UVB protection) and wide brim hats (4-inch brim around the entire circumference of the hat) are also recommended. - Call for new or changing lesions.  LN2 x 2 of nose  - Start 5-fluorouracil/calcipotriene cream twice a day for 7 days to affected areas including face. Prescription sent to [facility]. Patient advised they will receive an email to purchase the medication online and have it sent to their home. Patient provided with handout reviewing treatment course and side effects and advised to call or message [place] on MyChart with any concerns.   Destruction of lesion - Face Complexity: simple   Destruction method: cryotherapy    Informed consent: discussed and consent obtained   Timeout:  patient name, date of birth, surgical site, and procedure verified Lesion destroyed using liquid nitrogen: Yes   Region frozen until ice ball extended beyond lesion: Yes   Outcome: patient tolerated procedure well with no complications   Post-procedure details: wound care instructions given    Urticaria Urticaria or hives is a pink to red patchy whelp- like rash of the skin that typically itches and it is the result of histamine release in the skin.   Hives may have multiple causes including stress, medications, infections, and systemic illness.  Sometimes there is a family history of chronic urticaria.   "Physical urticarias" may be caused by heat, sun, cold, vibration.   Insect bites can cause "papular urticaria". It is often difficult to find the cause of generalized hives.  Statistically, 70% of the time a cause of generalized hives is not found.  Sometimes hives can spontaneously resolve. Other times hives can persist and when it does, and no cause is found, and it has been at least 6 weeks since started, it is called "chronic idiopathic urticaria". Antihistamines are the mainstay for treatment.  In severe cases Xolair injections may be used.  Recommend starting Claritin or Allegra daily. May increase up to 4 daily as needed.  Lentigines - Scattered tan macules - Due to sun exposure - Benign-appearing, observe - Recommend daily broad spectrum sunscreen SPF 30+ to sun-exposed areas,  reapply every 2 hours as needed. - Call for any changes  Seborrheic Keratoses - Stuck-on, waxy, tan-brown papules and/or plaques  - Benign-appearing - Discussed benign etiology and prognosis. - Observe - Call for any changes  Melanocytic Nevi - Tan-brown and/or pink-flesh-colored symmetric macules and papules - Benign appearing on exam today - Observation - Call clinic for new or changing moles - Recommend daily use of broad spectrum  spf 30+ sunscreen to sun-exposed areas.   Hemangiomas - Red papules - Discussed benign nature - Observe - Call for any changes  Skin cancer screening performed today.  Return in about 6 months (around 10/09/2022) for TBSE, HxMIS.  I, Ashok Cordia, CMA, am acting as scribe for Sarina Ser, MD . Documentation: I have reviewed the above documentation for accuracy and completeness, and I agree with the above.  Sarina Ser, MD

## 2022-04-10 ENCOUNTER — Encounter: Payer: Self-pay | Admitting: Dermatology

## 2022-07-12 ENCOUNTER — Other Ambulatory Visit: Payer: Self-pay | Admitting: Internal Medicine

## 2022-10-25 IMAGING — DX DG CHEST 1V PORT
1 series · 1 of 1 positions shown · non-contrast
Comparison: 09/10/2014

CLINICAL DATA: Chest pain and shortness of breath beginning this
morning.

EXAM:
PORTABLE CHEST 1 VIEW

[chest ap]
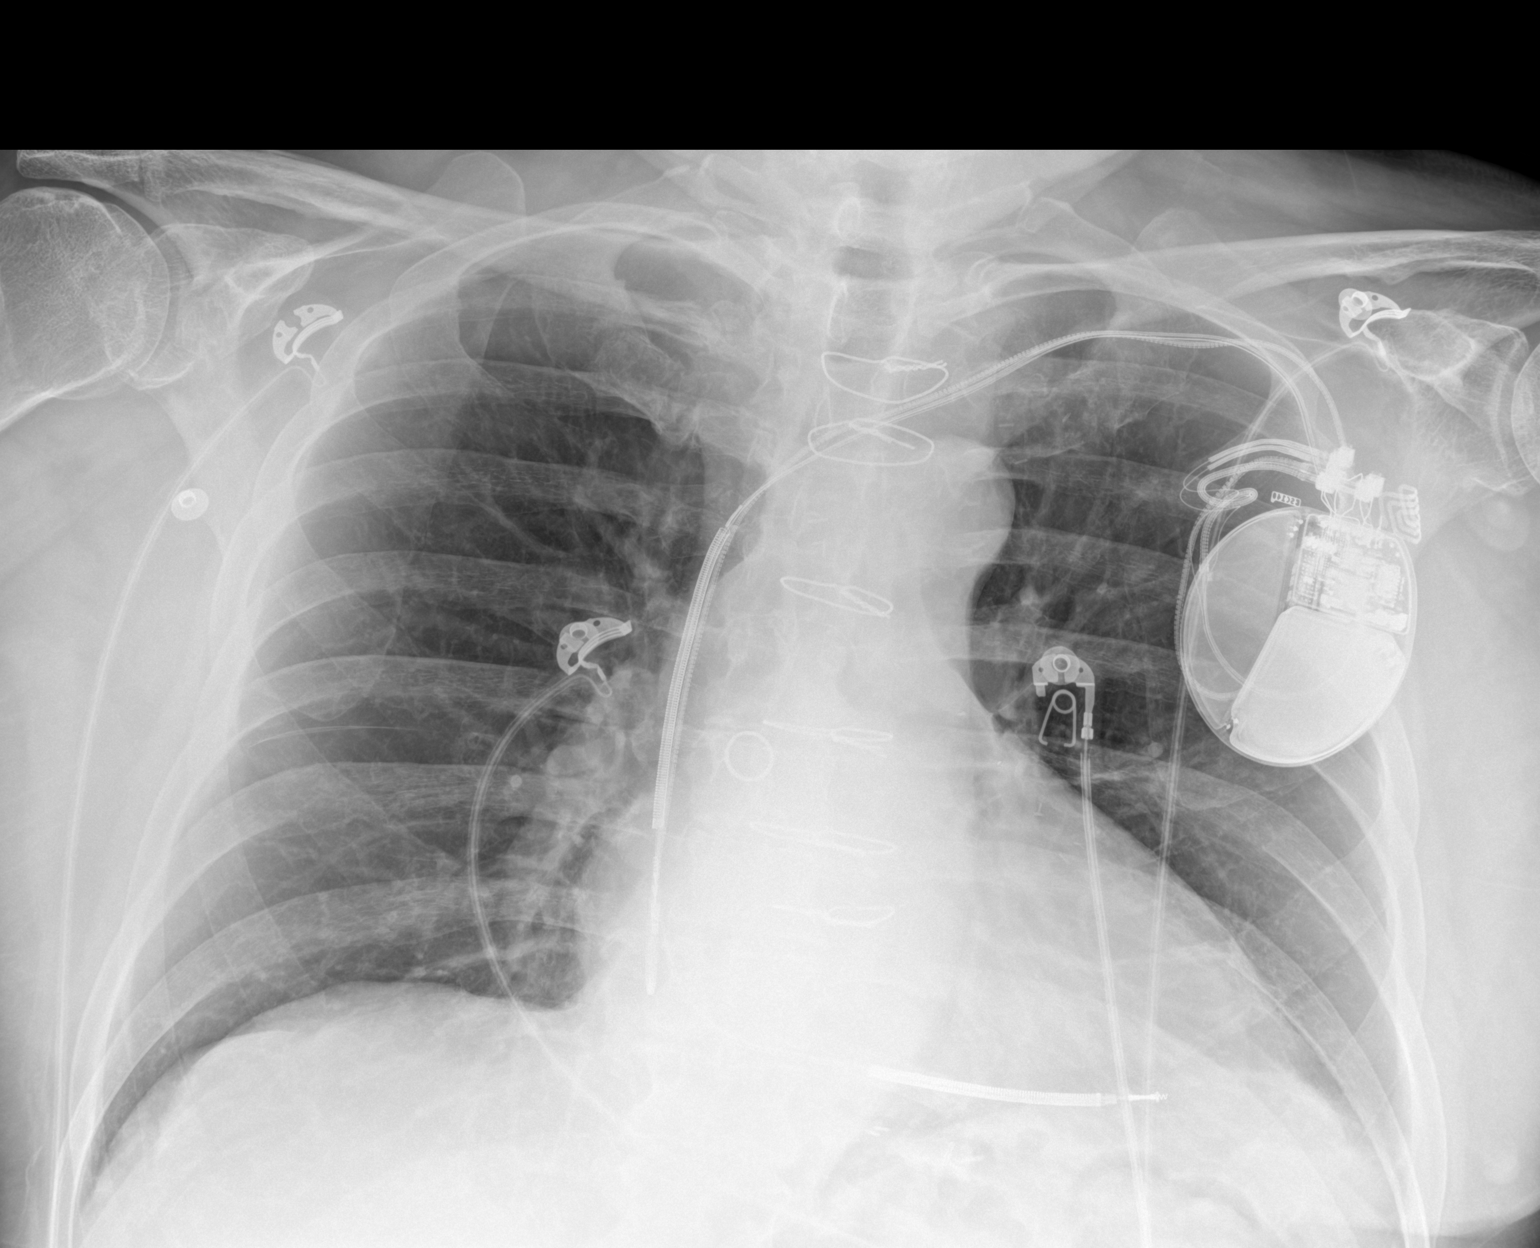

[1 of 1 positions shown; findings below may reference images not displayed]

FINDINGS: The heart size and mediastinal contours are within normal limits.
Prior CABG again noted. AICD remains in appropriate position. Both
lungs are clear. The visualized skeletal structures are
unremarkable.
IMPRESSION: No active disease.

## 2022-10-31 ENCOUNTER — Ambulatory Visit (INDEPENDENT_AMBULATORY_CARE_PROVIDER_SITE_OTHER): Payer: Medicare Other | Admitting: Dermatology

## 2022-10-31 VITALS — BP 80/45 | HR 66

## 2022-10-31 DIAGNOSIS — L578 Other skin changes due to chronic exposure to nonionizing radiation: Secondary | ICD-10-CM | POA: Diagnosis not present

## 2022-10-31 DIAGNOSIS — W908XXA Exposure to other nonionizing radiation, initial encounter: Secondary | ICD-10-CM

## 2022-10-31 DIAGNOSIS — L720 Epidermal cyst: Secondary | ICD-10-CM

## 2022-10-31 DIAGNOSIS — X32XXXA Exposure to sunlight, initial encounter: Secondary | ICD-10-CM

## 2022-10-31 DIAGNOSIS — D1801 Hemangioma of skin and subcutaneous tissue: Secondary | ICD-10-CM

## 2022-10-31 DIAGNOSIS — L570 Actinic keratosis: Secondary | ICD-10-CM

## 2022-10-31 DIAGNOSIS — Z86006 Personal history of melanoma in-situ: Secondary | ICD-10-CM

## 2022-10-31 DIAGNOSIS — L509 Urticaria, unspecified: Secondary | ICD-10-CM

## 2022-10-31 DIAGNOSIS — Z85820 Personal history of malignant melanoma of skin: Secondary | ICD-10-CM

## 2022-10-31 DIAGNOSIS — Z85828 Personal history of other malignant neoplasm of skin: Secondary | ICD-10-CM

## 2022-10-31 DIAGNOSIS — Z1283 Encounter for screening for malignant neoplasm of skin: Secondary | ICD-10-CM | POA: Diagnosis not present

## 2022-10-31 DIAGNOSIS — L821 Other seborrheic keratosis: Secondary | ICD-10-CM

## 2022-10-31 DIAGNOSIS — Z7189 Other specified counseling: Secondary | ICD-10-CM

## 2022-10-31 DIAGNOSIS — L814 Other melanin hyperpigmentation: Secondary | ICD-10-CM

## 2022-10-31 DIAGNOSIS — Z86018 Personal history of other benign neoplasm: Secondary | ICD-10-CM

## 2022-10-31 DIAGNOSIS — Z79899 Other long term (current) drug therapy: Secondary | ICD-10-CM

## 2022-10-31 DIAGNOSIS — D229 Melanocytic nevi, unspecified: Secondary | ICD-10-CM

## 2022-10-31 DIAGNOSIS — L729 Follicular cyst of the skin and subcutaneous tissue, unspecified: Secondary | ICD-10-CM

## 2022-10-31 DIAGNOSIS — D692 Other nonthrombocytopenic purpura: Secondary | ICD-10-CM

## 2022-10-31 NOTE — Progress Notes (Signed)
Follow-Up Visit   Subjective  Brandon Wall is a 82 y.o. male who presents for the following: Skin Cancer Screening and Full Body Skin Exam Patient bothered by bruising Patient has hives monthly on back and around neck Spot at back wife is concerned about   The patient presents for Total-Body Skin Exam (TBSE) for skin cancer screening and mole check. The patient has spots, moles and lesions to be evaluated, some may be new or changing and the patient has concerns that these could be cancer.    The following portions of the chart were reviewed this encounter and updated as appropriate: medications, allergies, medical history  Review of Systems:  No other skin or systemic complaints except as noted in HPI or Assessment and Plan.  Objective  Well appearing patient in no apparent distress; mood and affect are within normal limits.  A full examination was performed including scalp, head, eyes, ears, nose, lips, neck, chest, axillae, abdomen, back, buttocks, bilateral upper extremities, bilateral lower extremities, hands, feet, fingers, toes, fingernails, and toenails. All findings within normal limits unless otherwise noted below.   Relevant physical exam findings are noted in the Assessment and Plan.  face x 4 (4) Erythematous thin papules/macules with gritty scale.     Assessment & Plan   LENTIGINES, SEBORRHEIC KERATOSES, HEMANGIOMAS - Benign normal skin lesions - Benign-appearing - Call for any changes  MELANOCYTIC NEVI - Tan-brown and/or pink-flesh-colored symmetric macules and papules - Benign appearing on exam today - Observation - Call clinic for new or changing moles - Recommend daily use of broad spectrum spf 30+ sunscreen to sun-exposed areas.   Purpura - Chronic; persistent and recurrent.  Treatable, but not curable. - Violaceous macules and patches - Benign - Related to trauma, age, sun damage and/or use of blood thinners, chronic use of topical and/or oral  steroids - Observe - Can use OTC arnica containing moisturizer such as Dermend Bruise Formula if desired - Call for worsening or other concerns   Epidermal inclusion cyst Left mid back paraspinal  1 cm sub q nodule   Benign-appearing. Exam most consistent with an epidermal inclusion cyst. Discussed that a cyst is a benign growth that can grow over time and sometimes get irritated or inflamed. Recommend observation if it is not bothersome. Discussed option of surgical excision to remove it if it is growing, symptomatic, or other changes noted. Please call for new or changing lesions so they can be evaluated.  Urticaria Back and neck   Clear at exam   Urticaria or hives is a pink to red patchy whelp- like rash of the skin that typically itches and it is the result of histamine release in the skin.   Hives may have multiple causes including stress, medications, infections, and systemic illness.  Sometimes there is a family history of chronic urticaria.   "Physical urticarias" may be caused by heat, sun, cold, vibration.   Insect bites can cause "papular urticaria". It is often difficult to find the cause of generalized hives.  Statistically, 70% of the time a cause of generalized hives is not found.  Sometimes hives can spontaneously resolve. Other times hives can persist and when it does, and no cause is found, and it has been at least 6 weeks since started, it is called "chronic idiopathic urticaria". Antihistamines are the mainstay for treatment.  In severe cases Xolair injections may be used.  Recommend starting Claritin or Allegra daily. May increase up to 4 daily as needed.  For Hives (urticaria); dermatographism or Itch: Start non sedating antihistamine (either Allegra 180mg , or Claritin 10mg , or Zyrtec 10mg ) daily.  All these are non-prescription ("Over the Counter").   Start out with 1 pill a day.   After a week if not improving may increase to 2 pills a day.   After another week  if not improving may increase to 3 pills a day.   After another week if still not improving may take up to 4 pills a day. Stay at highest dose that keeps condition controlled, but only up to 4 pills a day. Stay at the controlling dose for at least 2 weeks. Contact office if taking 4 pills of antihistamine a day for at least 2 weeks without control of condition as other options may be available.     ACTINIC DAMAGE - Chronic condition, secondary to cumulative UV/sun exposure - diffuse scaly erythematous macules with underlying dyspigmentation - Recommend daily broad spectrum sunscreen SPF 30+ to sun-exposed areas, reapply every 2 hours as needed.  - Staying in the shade or wearing long sleeves, sun glasses (UVA+UVB protection) and wide brim hats (4-inch brim around the entire circumference of the hat) are also recommended for sun protection.  - Call for new or changing lesions.  HISTORY OF MELANOMA IN SITU - No evidence of recurrence today - Recommend regular full body skin exams - Recommend daily broad spectrum sunscreen SPF 30+ to sun-exposed areas, reapply every 2 hours as needed.  - Call if any new or changing lesions are noted between office visits  HISTORY OF SQUAMOUS CELL CARCINOMA OF THE SKIN - No evidence of recurrence today - No lymphadenopathy - Recommend regular full body skin exams - Recommend daily broad spectrum sunscreen SPF 30+ to sun-exposed areas, reapply every 2 hours as needed.  - Call if any new or changing lesions are noted between office visits    HISTORY OF BASAL CELL CARCINOMA OF THE SKIN - No evidence of recurrence today - Recommend regular full body skin exams - Recommend daily broad spectrum sunscreen SPF 30+ to sun-exposed areas, reapply every 2 hours as needed.  - Call if any new or changing lesions are noted between office visits  HISTORY OF DYSPLASTIC NEVUS 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  No evidence of recurrence today Recommend regular full body  skin exams Recommend daily broad spectrum sunscreen SPF 30+ to sun-exposed areas, reapply every 2 hours as needed.  Call if any new or changing lesions are noted between office visits   SKIN CANCER SCREENING PERFORMED TODAY.    Actinic keratosis (4) face x 4  Actinic keratoses are precancerous spots that appear secondary to cumulative UV radiation exposure/sun exposure over time. They are chronic with expected duration over 1 year. A portion of actinic keratoses will progress to squamous cell carcinoma of the skin. It is not possible to reliably predict which spots will progress to skin cancer and so treatment is recommended to prevent development of skin cancer.  Recommend daily broad spectrum sunscreen SPF 30+ to sun-exposed areas, reapply every 2 hours as needed.  Recommend staying in the shade or wearing long sleeves, sun glasses (UVA+UVB protection) and wide brim hats (4-inch brim around the entire circumference of the hat). Call for new or changing lesions.  Destruction of lesion - face x 4 Complexity: simple   Destruction method: cryotherapy   Informed consent: discussed and consent obtained   Timeout:  patient name, date of birth, surgical site, and procedure verified Lesion destroyed using  liquid nitrogen: Yes   Region frozen until ice ball extended beyond lesion: Yes   Outcome: patient tolerated procedure well with no complications   Post-procedure details: wound care instructions given     Return in about 1 year (around 10/31/2023) for TBSE.  IAsher Muir, CMA, am acting as scribe for Armida Sans, MD.   Documentation: I have reviewed the above documentation for accuracy and completeness, and I agree with the above.  Armida Sans, MD

## 2022-10-31 NOTE — Patient Instructions (Addendum)
For Hives (urticaria); dermatographism or Itch: Start non sedating antihistamine (either Allegra 180mg , or Claritin 10mg , or Zyrtec 10mg ) daily.  All these are non-prescription ("Over the Counter").   Start out with 1 pill a day.   After a week if not improving may increase to 2 pills a day.   After another week if not improving may increase to 3 pills a day.   After another week if still not improving may take up to 4 pills a day. Stay at highest dose that keeps condition controlled, but only up to 4 pills a day. Stay at the controlling dose for at least 2 weeks. Contact office if taking 4 pills of antihistamine a day for at least 2 weeks without control of condition as other options may be available.    Actinic keratoses are precancerous spots that appear secondary to cumulative UV radiation exposure/sun exposure over time. They are chronic with expected duration over 1 year. A portion of actinic keratoses will progress to squamous cell carcinoma of the skin. It is not possible to reliably predict which spots will progress to skin cancer and so treatment is recommended to prevent development of skin cancer.  Recommend daily broad spectrum sunscreen SPF 30+ to sun-exposed areas, reapply every 2 hours as needed.  Recommend staying in the shade or wearing long sleeves, sun glasses (UVA+UVB protection) and wide brim hats (4-inch brim around the entire circumference of the hat). Call for new or changing lesions.   Cryotherapy Aftercare  Wash gently with soap and water everyday.   Apply Vaseline and Band-Aid daily until healed.     Melanoma ABCDEs  Melanoma is the most dangerous type of skin cancer, and is the leading cause of death from skin disease.  You are more likely to develop melanoma if you: Have light-colored skin, light-colored eyes, or red or blond hair Spend a lot of time in the sun Tan regularly, either outdoors or in a tanning bed Have had blistering sunburns, especially  during childhood Have a close family member who has had a melanoma Have atypical moles or large birthmarks  Early detection of melanoma is key since treatment is typically straightforward and cure rates are extremely high if we catch it early.   The first sign of melanoma is often a change in a mole or a new dark spot.  The ABCDE system is a way of remembering the signs of melanoma.  A for asymmetry:  The two halves do not match. B for border:  The edges of the growth are irregular. C for color:  A mixture of colors are present instead of an even brown color. D for diameter:  Melanomas are usually (but not always) greater than 6mm - the size of a pencil eraser. E for evolution:  The spot keeps changing in size, shape, and color.  Please check your skin once per month between visits. You can use a small mirror in front and a large mirror behind you to keep an eye on the back side or your body.   If you see any new or changing lesions before your next follow-up, please call to schedule a visit.  Please continue daily skin protection including broad spectrum sunscreen SPF 30+ to sun-exposed areas, reapplying every 2 hours as needed when you're outdoors.   Staying in the shade or wearing long sleeves, sun glasses (UVA+UVB protection) and wide brim hats (4-inch brim around the entire circumference of the hat) are also recommended for sun protection.  Due to recent changes in healthcare laws, you may see results of your pathology and/or laboratory studies on MyChart before the doctors have had a chance to review them. We understand that in some cases there may be results that are confusing or concerning to you. Please understand that not all results are received at the same time and often the doctors may need to interpret multiple results in order to provide you with the best plan of care or course of treatment. Therefore, we ask that you please give [place] 2 business days to thoroughly review all your  results before contacting the office for clarification. Should we see a critical lab result, you will be contacted sooner.   If You Need Anything After Your Visit  If you have any questions or concerns for your doctor, please call our main line at [phone] and press option 4 to reach your doctor's medical assistant. If no one answers, please leave a voicemail as directed and we will return your call as soon as possible. Messages left after 4 pm will be answered the following business day.   You may also send [place] a message via MyChart. We typically respond to MyChart messages within 1-2 business days.  For prescription refills, please ask your pharmacy to contact our office. Our fax number is [phone].  If you have an urgent issue when the clinic is closed that cannot wait until the next business day, you can page your doctor at the number below.    Please note that while we do our best to be available for urgent issues outside of office hours, we are not available 24/7.   If you have an urgent issue and are unable to reach [place], you may choose to seek medical care at your doctor's office, retail clinic, urgent care center, or emergency room.  If you have a medical emergency, please immediately call 911 or go to the emergency department.  Pager Numbers  - Dr. Gwen Pounds: [phone]  - Dr. Neale Burly: [phone]  - Dr. Roseanne Reno: [phone]  In the event of inclement weather, please call our main line at [phone] for an update on the status of any delays or closures.  Dermatology Medication Tips: Please keep the boxes that topical medications come in in order to help keep track of the instructions about where and how to use these. Pharmacies typically print the medication instructions only on the boxes and not directly on the medication tubes.   If your medication is too expensive, please contact our office at [phone] option 4 or send [place] a message through MyChart.   We are  unable to tell what your co-pay for medications will be in advance as this is different depending on your insurance coverage. However, we may be able to find a substitute medication at lower cost or fill out paperwork to get insurance to cover a needed medication.   If a prior authorization is required to get your medication covered by your insurance company, please allow [place] 1-2 business days to complete this process.  Drug prices often vary depending on where the prescription is filled and some pharmacies may offer cheaper prices.  The website www.goodrx.com contains coupons for medications through different pharmacies. The prices here do not account for what the cost may be with help from insurance (it may be cheaper with your insurance), but the website can give you the price if you did not use any insurance.  - You can print the associated coupon and take it  with your prescription to the pharmacy.  - You may also stop by our office during regular business hours and pick up a GoodRx coupon card.  - If you need your prescription sent electronically to a different pharmacy, notify our office through [facility] or by phone at [phone] option 4.     Si Usted Necesita Algo Despus de Su Visita  Tambin puede enviarnos un mensaje a travs de Clinical cytogeneticist. Por lo general respondemos a los mensajes de MyChart en el transcurso de 1 a 2 das hbiles.  Para renovar recetas, por favor pida a su farmacia que se ponga en contacto con nuestra oficina. Annie Sable de fax es [place] [phone].  Si tiene un asunto urgente cuando la clnica est cerrada y que no puede esperar hasta el siguiente da hbil, puede llamar/localizar a su doctor(a) al nmero que aparece a continuacin.   Por favor, tenga en cuenta que aunque hacemos todo lo posible para estar disponibles para asuntos urgentes fuera del horario de [place], no estamos disponibles las 24 horas del da, los 7 [address]  [address] de la [place].   Si tiene un  problema urgente y no puede comunicarse con nosotros, puede optar por buscar atencin mdica  en el consultorio de su doctor(a), en una clnica privada, en un centro de atencin urgente o en una sala de emergencias.  Si tiene Engineer, drilling, por favor llame inmediatamente al 911 o vaya a la sala de emergencias.  Nmeros de bper  - Dr. Gwen Pounds: [phone]  - Dra. Moye: [phone]  - Dra. Roseanne Reno: [phone]  En caso de inclemencias del [place], por favor llame a Lacy Duverney principal al [phone] para una actualizacin sobre el [place] de cualquier retraso o cierre.  Consejos para la medicacin en dermatologa: Por favor, guarde las cajas en las que vienen los medicamentos de uso tpico para ayudarle a seguir las instrucciones sobre dnde y cmo usarlos. Las farmacias generalmente imprimen las instrucciones del medicamento slo en las cajas y no directamente en los tubos del [place].   Si su medicamento es muy caro, por favor, pngase en contacto con Rolm Gala llamando al [phone] y presione la opcin 4 o envenos un mensaje a travs de Clinical cytogeneticist.   No podemos decirle cul ser su copago por los medicamentos por adelantado ya que esto es diferente dependiendo de la cobertura de su seguro. Sin embargo, es posible que podamos encontrar un medicamento sustituto a Audiological scientist un formulario para que el seguro cubra el medicamento que se considera necesario.   Si se requiere una autorizacin previa para que su compaa de seguros [place] su medicamento, por favor permtanos de 1 a 2 das hbiles para completar [address]  Los precios de los medicamentos varan con frecuencia dependiendo del Environmental consultant de dnde se surte la receta y alguna farmacias pueden ofrecer precios ms baratos.  El sitio web www.goodrx.com tiene cupones para medicamentos de Health and safety inspector. Los precios aqu no tienen en cuenta lo que podra costar con la ayuda del seguro (puede ser ms  barato con su seguro), pero el sitio web puede darle el precio si no utiliz Tourist information centre manager.  - Puede imprimir el cupn correspondiente y llevarlo con su receta a la farmacia.  - Tambin puede pasar por nuestra oficina durante el horario de atencin regular y Education officer, museum una tarjeta de cupones de GoodRx.  - Si necesita que su receta se enve electrnicamente a Psychiatrist, informe a nuestra oficina a travs de MyChart de [facility] o por telfono llamando al [phone] y presione la opcin 4.

## 2022-11-13 ENCOUNTER — Encounter: Payer: Self-pay | Admitting: Dermatology

## 2023-04-22 IMAGING — CR DG CHEST 2V
1 series · 2 of 2 positions shown · non-contrast
Comparison: 06/24/2020.

CLINICAL DATA: Near syncope.

EXAM:
CHEST - 2 VIEW

[Series 1: dg chest 2 view · 0.14mm/px · 2 of 2 slices shown]
[im 1/2]
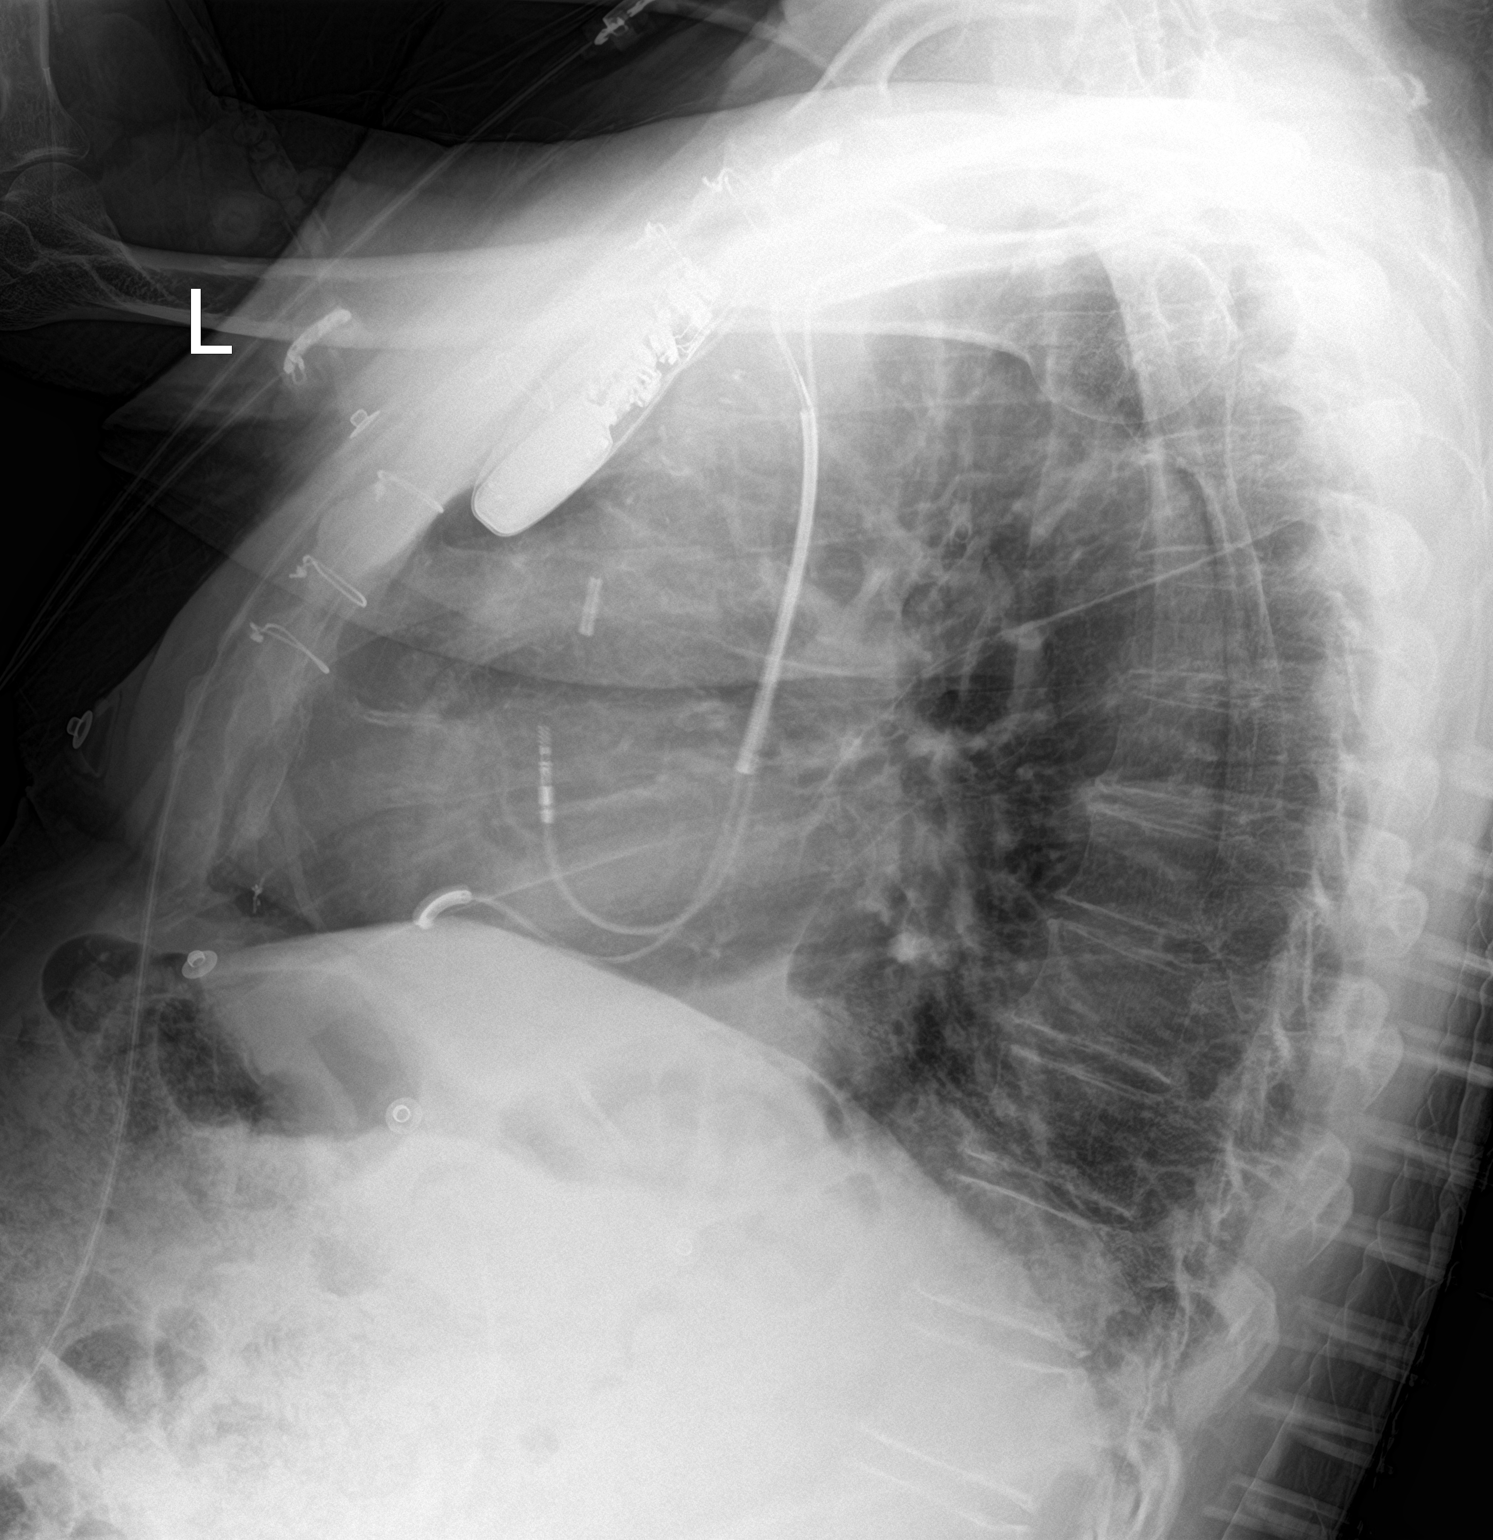
[im 2/2]
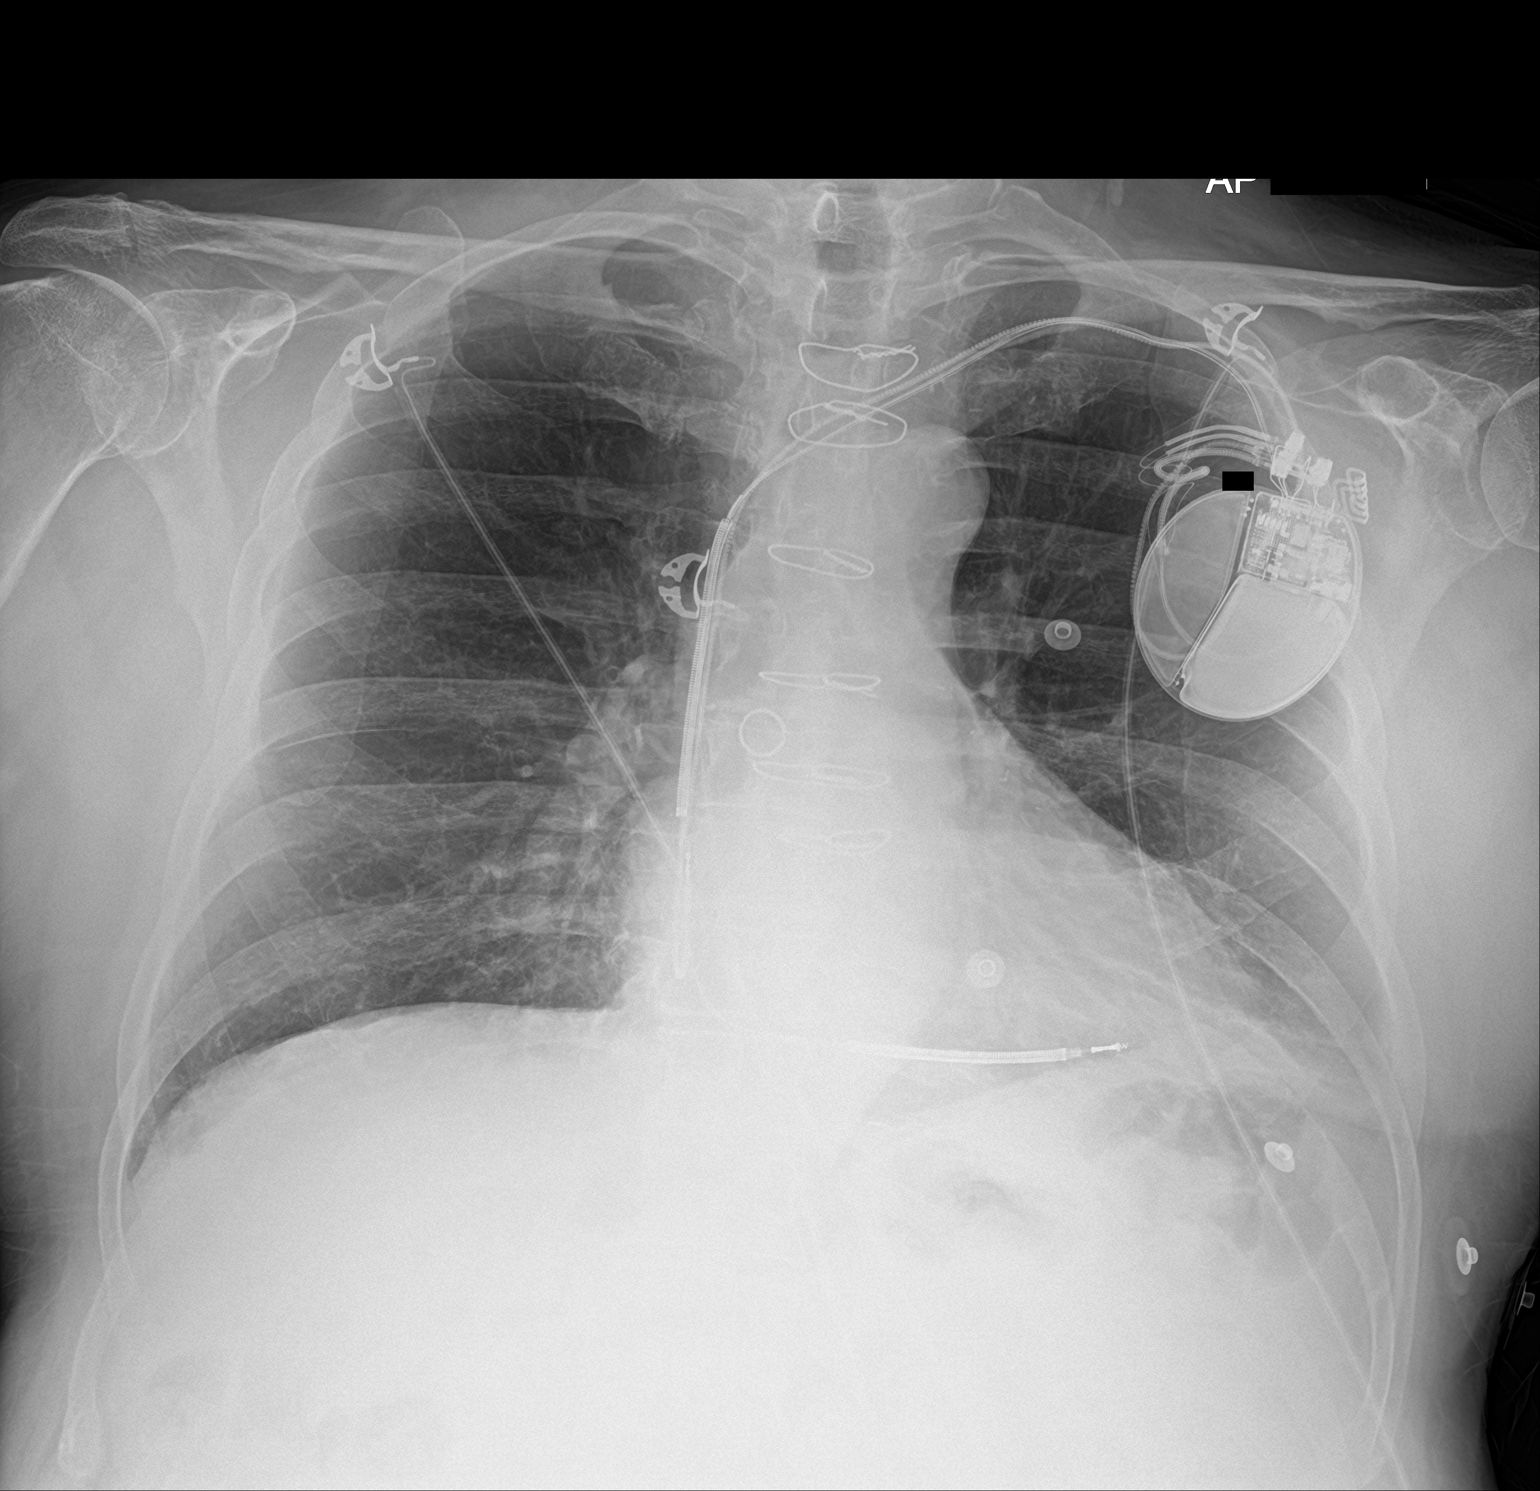

[2 of 2 positions shown; findings below may reference images not displayed]

FINDINGS: The heart size and mediastinal contours are within normal limits.
Similar positioning of a left dual lead subclavian approach cardiac
rhythm maintenance device. Median sternotomy. Atherosclerosis of the
aorta. Mild linear opacities at the left lung base. No visible
pleural effusions or pneumothorax. The visualized skeletal
structures are unremarkable.
IMPRESSION: Mild linear opacities at the left lung base, similar over multiple
priors and most likely atelectasis/scar.

## 2023-07-29 ENCOUNTER — Telehealth: Payer: Self-pay

## 2023-07-29 NOTE — Telephone Encounter (Signed)
Patient walked into office wanting to schedule surgery regarding cyst examined at last office visit in May.  Would someone please review and advise if okay to schedule the surgery with you?   Thank you!

## 2023-07-29 NOTE — Telephone Encounter (Signed)
Patient has been scheduled with Dr. Roseanne Reno. aw

## 2023-08-14 ENCOUNTER — Ambulatory Visit: Payer: Medicare Other | Admitting: Dermatology

## 2023-08-14 DIAGNOSIS — Z7189 Other specified counseling: Secondary | ICD-10-CM | POA: Diagnosis not present

## 2023-08-14 DIAGNOSIS — Z79899 Other long term (current) drug therapy: Secondary | ICD-10-CM

## 2023-08-14 DIAGNOSIS — L989 Disorder of the skin and subcutaneous tissue, unspecified: Secondary | ICD-10-CM

## 2023-08-14 DIAGNOSIS — L723 Sebaceous cyst: Secondary | ICD-10-CM

## 2023-08-14 DIAGNOSIS — L720 Epidermal cyst: Secondary | ICD-10-CM | POA: Diagnosis not present

## 2023-08-14 MED ORDER — DOXYCYCLINE MONOHYDRATE 100 MG PO CAPS: 100.0000 mg | ORAL_CAPSULE | Freq: Two times a day (BID) | ORAL | 0 refills | Status: AC

## 2023-08-14 NOTE — Patient Instructions (Addendum)
 Start doxycycline 100 mg take 1 capsule by mouth twice daily with food until finished..  Doxycycline should be taken with food to prevent nausea. Do not lay down for 30 minutes after taking. Be cautious with sun exposure and use good sun protection while on this medication. Pregnant women should not take this medication.     Wound Care Instructions for After Surgery  On the day following your surgery, you should begin doing daily dressing changes until your sutures are removed: Remove the bandage. Cleanse the wound gently with soap and water.  Make sure you then dry the skin surrounding the wound completely or the tape will not stick to the skin. Do not use cotton balls on the wound. After the wound is clean and dry, apply the ointment (either prescription antibiotic prescribed by your doctor or plain Vaseline if nothing was prescribed) gently with a Q-tip. If you are using a bandaid to cover: Apply a bandaid large enough to cover the entire wound. If you do not have a bandaid large enough to cover the wound OR if you are sensitive to bandaid adhesive: Cut a non-stick pad (such as Telfa) to fit the size of the wound.  Cover the wound with the non-stick pad. If the wound is draining, you may want to add a small amount of gauze on top of the non-stick pad for a little added compression to the area. Use tape to seal the area completely.  For the next 1-2 weeks: Be sure to keep the wound moist with ointment 24/7 to ensure best healing. If you are unable to cover the wound with a bandage to hold the ointment in place, you may need to reapply the ointment several times a day. Do not bend over or lift heavy items to reduce the chance of elevated blood pressure to the wound. Do not participate in particularly strenuous activities.  Below is a list of dressing supplies you might need.  Cotton-tipped applicators - Q-tips Gauze pads (2x2 and/or 4x4) - All-Purpose Sponges New and clean tube of  petroleum jelly (Vaseline) OR prescription antibiotic ointment if prescribed Either a bandaid large enough to cover the entire wound OR non-stick dressing material (Telfa) and Tape (Paper or Hypafix)  FOR ADULT SURGERY PATIENTS: If you need something for pain relief, you may take 1 extra strength Tylenol (acetaminophen) and 2 ibuprofen (200 mg) together every 4 hours as needed. (Do not take these medications if you are allergic to them or if you know you cannot take them for any other reason). Typically you may only need pain medication for 1-3 days.   Comments on the Post-Operative Period Slight swelling and redness often appear around the wound. This is normal and will disappear within several days following the surgery. The healing wound will drain a brownish-red-yellow discharge during healing. This is a normal phase of wound healing. As the wound begins to heal, the drainage may increase in amount. Again, this drainage is normal. Notify us if the drainage becomes persistently bloody, excessively swollen, or intensely painful or develops a foul odor or red streaks.  The healing wound will also typically be itchy. This is normal. If you have severe or persistent pain, Notify us if the discomfort is severe or persistent. Avoid alcoholic beverages when taking pain medicine.  In Case of Wound Hemorrhage A wound hemorrhage is when the bandage suddenly becomes soaked with bright red blood and flows profusely. If this happens, sit down or lie down with your head elevated.  If the wound has a dressing on it, do not remove the dressing. Apply pressure to the existing gauze. If the wound is not covered, use a gauze pad to apply pressure and continue applying the pressure for 20 minutes without peeking. DO NOT COVER THE WOUND WITH A LARGE TOWEL OR WASH CLOTH. Release your hand from the wound site but do not remove the dressing. If the bleeding has stopped, gently clean around the wound. Leave the dressing in  place for 24 hours if possible. This wait time allows the blood vessels to close off so that you do not spark a new round of bleeding by disrupting the newly clotted blood vessels with an immediate dressing change. If the bleeding does not subside, continue to hold pressure for 40 minutes. If bleeding continues, page your physician, contact an After Hours clinic or go to the Emergency Room.    Due to recent changes in healthcare laws, you may see results of your pathology and/or laboratory studies on MyChart before the doctors have had a chance to review them. We understand that in some cases there may be results that are confusing or concerning to you. Please understand that not all results are received at the same time and often the doctors may need to interpret multiple results in order to provide you with the best plan of care or course of treatment. Therefore, we ask that you please give [place] 2 business days to thoroughly review all your results before contacting the office for clarification. Should we see a critical lab result, you will be contacted sooner.   If You Need Anything After Your Visit  If you have any questions or concerns for your doctor, please call our main line at [phone] and press option 4 to reach your doctor's medical assistant. If no one answers, please leave a voicemail as directed and we will return your call as soon as possible. Messages left after 4 pm will be answered the following business day.   You may also send [place] a message via MyChart. We typically respond to MyChart messages within 1-2 business days.  For prescription refills, please ask your pharmacy to contact our office. Our fax number is [phone].  If you have an urgent issue when the clinic is closed that cannot wait until the next business day, you can page your doctor at the number below.    Please note that while we do our best to be available for urgent issues outside of office hours, we are not  available 24/7.   If you have an urgent issue and are unable to reach [place], you may choose to seek medical care at your doctor's office, retail clinic, urgent care center, or emergency room.  If you have a medical emergency, please immediately call 911 or go to the emergency department.  Pager Numbers  - Dr. Gwen Pounds: [phone]  - Dr. Roseanne Reno: [phone]  - Dr. Katrinka Blazing: [phone]   In the event of inclement weather, please call our main line at [phone] for an update on the status of any delays or closures.  Dermatology Medication Tips: Please keep the boxes that topical medications come in in order to help keep track of the instructions about where and how to use these. Pharmacies typically print the medication instructions only on the boxes and not directly on the medication tubes.   If your medication is too expensive, please contact our office at [phone] option 4 or send [place] a message through MyChart.  We are unable to tell what your co-pay for medications will be in advance as this is different depending on your insurance coverage. However, we may be able to find a substitute medication at lower cost or fill out paperwork to get insurance to cover a needed medication.   If a prior authorization is required to get your medication covered by your insurance company, please allow [place] 1-2 business days to complete this process.  Drug prices often vary depending on where the prescription is filled and some pharmacies may offer cheaper prices.  The website www.goodrx.com contains coupons for medications through different pharmacies. The prices here do not account for what the cost may be with help from insurance (it may be cheaper with your insurance), but the website can give you the price if you did not use any insurance.  - You can print the associated coupon and take it with your prescription to the pharmacy.  - You may also stop by our office during regular business hours  and pick up a GoodRx coupon card.  - If you need your prescription sent electronically to a different pharmacy, notify our office through [facility] or by phone at [phone] option 4.     Si Usted Necesita Algo Despus de Su Visita  Tambin puede enviarnos un mensaje a travs de Clinical cytogeneticist. Por lo general respondemos a los mensajes de MyChart en el transcurso de 1 a 2 das hbiles.  Para renovar recetas, por favor pida a su farmacia que se ponga en contacto con nuestra oficina. Annie Sable de fax es [place] [phone].  Si tiene un asunto urgente cuando la clnica est cerrada y que no puede esperar hasta el siguiente da hbil, puede llamar/localizar a su doctor(a) al nmero que aparece a continuacin.   Por favor, tenga en cuenta que aunque hacemos todo lo posible para estar disponibles para asuntos urgentes fuera del horario de [place], no estamos disponibles las 24 horas del da, los 7 [address]  [address] de la [place].   Si tiene un problema urgente y no puede comunicarse con nosotros, puede optar por buscar atencin mdica  en el consultorio de su doctor(a), en una clnica privada, en un centro de atencin urgente o en una sala de emergencias.  Si tiene Engineer, drilling, por favor llame inmediatamente al 911 o vaya a la sala de emergencias.  Nmeros de bper  - Dr. Gwen Pounds: [phone]  - Dra. Roseanne Reno: [phone]  - Dr. Katrinka Blazing: [phone]   En caso de inclemencias del tiempo, por favor llame a Lacy Duverney principal al [phone] para una actualizacin sobre el [place] de cualquier retraso o cierre.  Consejos para la medicacin en dermatologa: Por favor, guarde las cajas en las que vienen los medicamentos de uso tpico para ayudarle a seguir las instrucciones sobre dnde y cmo usarlos. Las farmacias generalmente imprimen las instrucciones del medicamento slo en las cajas y no directamente en los tubos del [place].   Si su medicamento es muy caro, por favor, pngase  en contacto con Rolm Gala llamando al [phone] y presione la opcin 4 o envenos un mensaje a travs de Clinical cytogeneticist.   No podemos decirle cul ser su copago por los medicamentos por adelantado ya que esto es diferente dependiendo de la cobertura de su seguro. Sin embargo, es posible que podamos encontrar un medicamento sustituto a Audiological scientist un formulario para que el seguro cubra el medicamento que se considera necesario.   Si se requiere una autorizacin previa para que  su compaa de seguros [place] su medicamento, por favor permtanos de 1 a 2 das hbiles para completar [address]  Los precios de los medicamentos varan con frecuencia dependiendo del Environmental consultant de dnde se surte la receta y alguna farmacias pueden ofrecer precios ms baratos.  El sitio web www.goodrx.com tiene cupones para medicamentos de Health and safety inspector. Los precios aqu no tienen en cuenta lo que podra costar con la ayuda del seguro (puede ser ms barato con su seguro), pero el sitio web puede darle el precio si no utiliz Tourist information centre manager.  - Puede imprimir el cupn correspondiente y llevarlo con su receta a la farmacia.  - Tambin puede pasar por nuestra oficina durante el horario de atencin regular y Education officer, museum una tarjeta de cupones de GoodRx.  - Si necesita que su receta se enve electrnicamente a una farmacia diferente, informe a nuestra oficina a travs de MyChart de [place] o por telfono llamando al [phone] y presione la opcin 4.

## 2023-08-14 NOTE — Progress Notes (Signed)
   Follow-Up Visit   Subjective  Brandon Wall is a 83 y.o. male who presents for the following: Cyst of the left mid back paraspinal. Patient here for excision today, but cyst has become painful and inflamed and is draining pus.   The following portions of the chart were reviewed this encounter and updated as appropriate: medications, allergies, medical history  Review of Systems:  No other skin or systemic complaints except as noted in HPI or Assessment and Plan.  Objective  Well appearing patient in no apparent distress; mood and affect are within normal limits.  A focused examination was performed of the following areas: Back  Relevant physical exam findings are noted in the Assessment and Plan.  Left spinal mid back 3.0 cm erythematous subq nodule with drainage at left spinal mid back  Assessment & Plan   INFLAMED EPIDERMOID CYST OF SKIN Left spinal mid back Vs Abscess.  Symptomatic, painful, patient would like treated. Since inflamed, will do I&D today instead of excision  Start doxycycline monohydrate 100 MG take 1 po BID with food dsp #60 0Rf  Doxycycline should be taken with food to prevent nausea. Do not lay down for 30 minutes after taking. Be cautious with sun exposure and use good sun protection while on this medication. Pregnant women should not take this medication.    Counseling and Coordination of Care- If cyst no longer inflamed (swollen/red/painful) but still present after 2 months, pt may reschedule for surgical excision Incision and Drainage - Left spinal mid back Location: left spinal mid back  Informed Consent: Discussed risks (permanent scarring, light or dark discoloration, infection, pain, bleeding, bruising, redness, damage to adjacent structures, and recurrence of the lesion) and benefits of the procedure, as well as the alternatives.  Informed consent was obtained.  Preparation: The area was prepped with alcohol.  Anesthesia: Lidocaine 1% with  epinephrine 6 cc  Procedure Details: An incision was made overlying the lesion. The lesion drained pus, white, chalky cyst material, and blood.  A large amount of fluid was drained.    Antibiotic ointment and a sterile pressure dressing were applied. The patient tolerated procedure well.  Total number of lesions drained: 1  Plan: The patient was instructed on post-op care. Recommend OTC analgesia as needed for pain.  PAINFUL SKIN LESION     Return if symptoms worsen or fail to improve.  ICherlyn Labella, CMA, am acting as scribe for Willeen Niece, MD .   Documentation: I have reviewed the above documentation for accuracy and completeness, and I agree with the above.  Willeen Niece, MD

## 2023-10-25 ENCOUNTER — Other Ambulatory Visit: Payer: Self-pay | Admitting: Otolaryngology

## 2023-10-25 DIAGNOSIS — H9041 Sensorineural hearing loss, unilateral, right ear, with unrestricted hearing on the contralateral side: Secondary | ICD-10-CM

## 2023-11-05 ENCOUNTER — Ambulatory Visit: Admitting: Dermatology

## 2023-11-05 ENCOUNTER — Encounter: Payer: Self-pay | Admitting: Dermatology

## 2023-11-05 DIAGNOSIS — Z86018 Personal history of other benign neoplasm: Secondary | ICD-10-CM

## 2023-11-05 DIAGNOSIS — W908XXA Exposure to other nonionizing radiation, initial encounter: Secondary | ICD-10-CM | POA: Diagnosis not present

## 2023-11-05 DIAGNOSIS — Z1283 Encounter for screening for malignant neoplasm of skin: Secondary | ICD-10-CM

## 2023-11-05 DIAGNOSIS — D229 Melanocytic nevi, unspecified: Secondary | ICD-10-CM

## 2023-11-05 DIAGNOSIS — L820 Inflamed seborrheic keratosis: Secondary | ICD-10-CM

## 2023-11-05 DIAGNOSIS — Z8589 Personal history of malignant neoplasm of other organs and systems: Secondary | ICD-10-CM

## 2023-11-05 DIAGNOSIS — Z79899 Other long term (current) drug therapy: Secondary | ICD-10-CM

## 2023-11-05 DIAGNOSIS — L578 Other skin changes due to chronic exposure to nonionizing radiation: Secondary | ICD-10-CM | POA: Diagnosis not present

## 2023-11-05 DIAGNOSIS — Z85820 Personal history of malignant melanoma of skin: Secondary | ICD-10-CM

## 2023-11-05 DIAGNOSIS — Z7189 Other specified counseling: Secondary | ICD-10-CM

## 2023-11-05 DIAGNOSIS — L570 Actinic keratosis: Secondary | ICD-10-CM

## 2023-11-05 DIAGNOSIS — L814 Other melanin hyperpigmentation: Secondary | ICD-10-CM

## 2023-11-05 DIAGNOSIS — Z86006 Personal history of melanoma in-situ: Secondary | ICD-10-CM

## 2023-11-05 DIAGNOSIS — Z85828 Personal history of other malignant neoplasm of skin: Secondary | ICD-10-CM

## 2023-11-05 DIAGNOSIS — D1801 Hemangioma of skin and subcutaneous tissue: Secondary | ICD-10-CM

## 2023-11-05 DIAGNOSIS — L918 Other hypertrophic disorders of the skin: Secondary | ICD-10-CM

## 2023-11-05 DIAGNOSIS — Z5111 Encounter for antineoplastic chemotherapy: Secondary | ICD-10-CM

## 2023-11-05 DIAGNOSIS — L821 Other seborrheic keratosis: Secondary | ICD-10-CM

## 2023-11-05 MED ORDER — FLUOROURACIL 5 % EX CREA: TOPICAL_CREAM | CUTANEOUS | 1 refills | Status: AC

## 2023-11-05 NOTE — Progress Notes (Signed)
 Follow-Up Visit   Subjective  Brandon Wall is a 83 y.o. male who presents for the following: Skin Cancer Screening and Full Body Skin Exam, hx of Melanoma in situ, hx of BCC, hx of SCC, hx of Dysplastic nevus.  The patient presents for Total-Body Skin Exam (TBSE) for skin cancer screening and mole check. The patient has spots, moles and lesions to be evaluated, some may be new or changing and the patient may have concern these could be cancer.  The following portions of the chart were reviewed this encounter and updated as appropriate: medications, allergies, medical history  Review of Systems:  No other skin or systemic complaints except as noted in HPI or Assessment and Plan.  Objective  Well appearing patient in no apparent distress; mood and affect are within normal limits.  A full examination was performed including scalp, head, eyes, ears, nose, lips, neck, chest, axillae, abdomen, back, buttocks, bilateral upper extremities, bilateral lower extremities, hands, feet, fingers, toes, fingernails, and toenails. All findings within normal limits unless otherwise noted below.   Relevant physical exam findings are noted in the Assessment and Plan.  face x 16 (16) Erythematous thin papules/macules with gritty scale.  left anterior scalp, right back (2) Stuck-on, waxy, tan-brown papule or plaque --Discussed benign etiology and prognosis.   Assessment & Plan   SKIN CANCER SCREENING PERFORMED TODAY.  LENTIGINES, SEBORRHEIC KERATOSES, HEMANGIOMAS - Benign normal skin lesions - Benign-appearing - Call for any changes  MELANOCYTIC NEVI - Tan-brown and/or pink-flesh-colored symmetric macules and papules - Benign appearing on exam today - Observation - Call clinic for new or changing moles - Recommend daily use of broad spectrum spf 30+ sunscreen to sun-exposed areas.   HISTORY OF DYSPLASTIC NEVUS No evidence of recurrence today Recommend regular full body skin exams Recommend  daily broad spectrum sunscreen SPF 30+ to sun-exposed areas, reapply every 2 hours as needed.  Call if any new or changing lesions are noted between office visits   HISTORY OF BASAL CELL CARCINOMA OF THE SKIN - No evidence of recurrence today - Recommend regular full body skin exams - Recommend daily broad spectrum sunscreen SPF 30+ to sun-exposed areas, reapply every 2 hours as needed.  - Call if any new or changing lesions are noted between office visits   HISTORY OF SQUAMOUS CELL CARCINOMA OF THE SKIN - No evidence of recurrence today - No lymphadenopathy - Recommend regular full body skin exams - Recommend daily broad spectrum sunscreen SPF 30+ to sun-exposed areas, reapply every 2 hours as needed.  - Call if any new or changing lesions are noted between office visits   HISTORY OF MELANOMA IN SITU - No evidence of recurrence today - Recommend regular full body skin exams - Recommend daily broad spectrum sunscreen SPF 30+ to sun-exposed areas, reapply every 2 hours as needed.  - Call if any new or changing lesions are noted between office visits     ACROCHORDONS (Skin Tags) - Removal desired by patient due to irritation / symptomatic - Fleshy, skin-colored pedunculated papules - Benign appearing.  - Patient desires removal. - Prior to procedure, discussed risks of blister formation, small wound, skin dyspigmentation, or rare scar following cryotherapy.   Destruction Procedure Note Destruction method: cryotherapy   Informed consent: discussed and consent obtained   Lesion destroyed using liquid nitrogen: Yes   Outcome: patient tolerated procedure well with no complications   Post-procedure details: wound care instructions given   Locations: right neck  # of  Lesions Treated: 1  Prior to procedure, discussed risks of blister formation, small wound, skin dyspigmentation, or rare scar following cryotherapy. Recommend Vaseline ointment to treated areas while healing.   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 - diffuse scaly erythematous macules and papules with underlying dyspigmentation - Discussed Prescription "Field Treatment" topical Chemotherapy for Severe, Chronic Confluent Actinic Changes with Pre-Cancerous Actinic Keratoses Field treatment involves treatment of an entire area of skin that has confluent Actinic Changes (Sun/ Ultraviolet light damage) and PreCancerous Actinic Keratoses by method of PhotoDynamic Therapy (PDT) and/or prescription Topical Chemotherapy agents such as 5-fluorouracil, 5-fluorouracil/calcipotriene, and/or imiquimod.  The purpose is to decrease the number of clinically evident and subclinical PreCancerous lesions to prevent progression to development of skin cancer by chemically destroying early precancer changes that may or may not be visible.  It has been shown to reduce the risk of developing skin cancer in the treated area. As a result of treatment, redness, scaling, crusting, and open sores may occur during treatment course. One or more than one of these methods may be used and may have to be used several times to control, suppress and eliminate the PreCancerous changes. Discussed treatment course, expected reaction, and possible side effects. - Recommend daily broad spectrum sunscreen SPF 30+ to sun-exposed areas, reapply every 2 hours as needed.  - Staying in the shade or wearing long sleeves, sun glasses (UVA+UVB protection) and wide brim hats (4-inch brim around the entire circumference of the hat) are also recommended. - Call for new or changing lesions.   In July start 5FU/Calcipotriene cream  Apply to forehead and temples  twice a day x 7 days Destruction of lesion - face x 16 (16) Complexity: simple   Destruction method: cryotherapy   Informed consent: discussed and consent obtained   Timeout:  patient name, date of birth, surgical site, and procedure verified Lesion destroyed using liquid nitrogen: Yes   Region frozen until ice ball extended beyond lesion: Yes   Outcome: patient tolerated procedure well with no complications   Post-procedure details: wound care instructions given   INFLAMED SEBORRHEIC KERATOSIS (2) left anterior scalp, right back (2) Symptomatic, irritating, patient would like treated.  Destruction of lesion - left anterior scalp, right back (2) Complexity: simple   Destruction method: cryotherapy   Informed consent: discussed and consent obtained   Timeout:  patient name, date of birth, surgical site, and procedure verified Lesion destroyed using liquid nitrogen: Yes   Region frozen until ice ball extended beyond lesion: Yes   Outcome: patient tolerated procedure well with no complications   Post-procedure details: wound care instructions given     Return in about 1 year (around 11/04/2024) for TBSE, hx of BCC, hx of SCC, hx of MMis .  IClara Crisp, CMA, am acting as scribe for Celine Collard, MD .   Documentation: I have reviewed the above documentation for accuracy and completeness, and I agree with the above.  Celine Collard, MD

## 2023-11-05 NOTE — Patient Instructions (Addendum)
 JULY 5 start  5-Fluorouracil/Calcipotriene apply to forehead and temples twice a day x 7 days   Patient Education   Actinic keratoses are the dry, red scaly spots on the skin caused by sun damage. A portion of these spots can turn into skin cancer with time, and treating them can help prevent development of skin cancer.   Treatment of these spots requires removal of the defective skin cells. There are various ways to remove actinic keratoses, including freezing with liquid nitrogen, treatment with creams, or treatment with a blue light procedure in the office.   5-fluorouracil cream is a topical cream used to treat actinic keratoses. It works by interfering with the growth of abnormal fast-growing skin cells, such as actinic keratoses. These cells peel off and are replaced by healthy ones.   5-fluorouracil/calcipotriene is a combination of the 5-fluorouracil cream with a vitamin D analog cream called calcipotriene. The calcipotriene alone does not treat actinic keratoses. However, when it is combined with 5-fluorouracil, it helps the 5-fluorouracil treat the actinic keratoses much faster so that the same results can be achieved with a much shorter treatment time.  INSTRUCTIONS FOR 5-FLUOROURACIL/CALCIPOTRIENE CREAM:   5-fluorouracil/calcipotriene cream typically only needs to be used for 4-7 days. A thin layer should be applied twice a day to the treatment areas recommended by your physician.   If your physician prescribed you separate tubes of 5-fluourouracil and calcipotriene, apply a thin layer of 5-fluorouracil followed by a thin layer of calcipotriene.   Avoid contact with your eyes, nostrils, and mouth. Do not use 5-fluorouracil/calcipotriene cream on infected or open wounds.   You will develop redness, irritation and some crusting at areas where you have pre-cancer damage/actinic keratoses. IF YOU DEVELOP PAIN, BLEEDING, OR SIGNIFICANT CRUSTING, STOP THE TREATMENT EARLY - you have already  gotten a good response and the actinic keratoses should clear up well.  Wash your hands after applying 5-fluorouracil 5% cream on your skin.   A moisturizer or sunscreen with a minimum SPF 30 should be applied each morning.   Once you have finished the treatment, you can apply a thin layer of Vaseline twice a day to irritated areas to soothe and calm the areas more quickly. If you experience significant discomfort, contact your physician.  For some patients it is necessary to repeat the treatment for best results.  SIDE EFFECTS: When using 5-fluorouracil/calcipotriene cream, you may have mild irritation, such as redness, dryness, swelling, or a mild burning sensation. This usually resolves within 2 weeks. The more actinic keratoses you have, the more redness and inflammation you can expect during treatment. Eye irritation has been reported rarely. If this occurs, please let us  know.  If you have any trouble using this cream, please call the office. If you have any other questions about this information, please do not hesitate to ask me before you leave the office.    Instructions for [facility] Medications  One or more of your medications was sent to the [facility] mail order compounding pharmacy. You will receive an email from them and can purchase the medicine through that link. It will then be mailed to your home at the address you confirmed. If for any reason you do not receive an email from them, please check your spam folder. If you still do not find the email, please let us  know. [facility] phone number is [phone].      Cryotherapy Aftercare  Wash gently with soap and water everyday.   Apply Vaseline  and Band-Aid daily until healed.      Due to recent changes in healthcare laws, you may see results of your pathology and/or laboratory studies on MyChart before the doctors have had a chance to review them. We understand that in some cases there may be results  that are confusing or concerning to you. Please understand that not all results are received at the same time and often the doctors may need to interpret multiple results in order to provide you with the best plan of care or course of treatment. Therefore, we ask that you please give us  2 business days to thoroughly review all your results before contacting the office for clarification. Should we see a critical lab result, you will be contacted sooner.   If You Need Anything After Your Visit  If you have any questions or concerns for your doctor, please call our main line at [phone] and press option 4 to reach your doctor's medical assistant. If no one answers, please leave a voicemail as directed and we will return your call as soon as possible. Messages left after 4 pm will be answered the following business day.   You may also send us  a message via MyChart. We typically respond to MyChart messages within 1-2 business days.  For prescription refills, please ask your pharmacy to contact our office. Our fax number is [phone].  If you have an urgent issue when the clinic is closed that cannot wait until the next business day, you can page your doctor at the number below.    Please note that while we do our best to be available for urgent issues outside of office hours, we are not available 24/7.   If you have an urgent issue and are unable to reach us , you may choose to seek medical care at your doctor's office, retail clinic, urgent care center, or emergency room.  If you have a medical emergency, please immediately call 911 or go to the emergency department.  Pager Numbers  - Dr. Bary Likes: [phone]  - Dr. Annette Barters: [phone]  - Dr. Felipe Horton: [phone]   In the event of inclement weather, please call our main line at [phone] for an update on the status of any delays or closures.  Dermatology Medication Tips: Please keep the boxes that topical medications come in in  order to help keep track of the instructions about where and how to use these. Pharmacies typically print the medication instructions only on the boxes and not directly on the medication tubes.   If your medication is too expensive, please contact our office at [phone] option 4 or send us  a message through MyChart.   We are unable to tell what your co-pay for medications will be in advance as this is different depending on your insurance coverage. However, we may be able to find a substitute medication at lower cost or fill out paperwork to get insurance to cover a needed medication.   If a prior authorization is required to get your medication covered by your insurance company, please allow us  1-2 business days to complete this process.  Drug prices often vary depending on where the prescription is filled and some pharmacies may offer cheaper prices.  The website www.goodrx.com contains coupons for medications through different pharmacies. The prices here do not account for what the cost may be with help from insurance (it may be cheaper with your insurance), but the website can give you the price if you did not use any insurance.  -  You can print the associated coupon and take it with your prescription to the pharmacy.  - You may also stop by our office during regular business hours and pick up a GoodRx coupon card.  - If you need your prescription sent electronically to a different pharmacy, notify our office through [facility] or by phone at [phone] option 4.     Si Usted Necesita Algo Despus de Su Visita  Tambin puede enviarnos un mensaje a travs de Clinical cytogeneticist. Por lo general respondemos a los mensajes de MyChart en el transcurso de 1 a 2 das hbiles.  Para renovar recetas, por favor pida a su farmacia que se ponga en contacto con nuestra oficina. Franz Jacks de fax es [place] [phone].  Si tiene un asunto urgente cuando la clnica est cerrada y que no puede  esperar hasta el siguiente da hbil, puede llamar/localizar a su doctor(a) al nmero que aparece a continuacin.   Por favor, tenga en cuenta que aunque hacemos todo lo posible para estar disponibles para asuntos urgentes fuera del horario de [place], no estamos disponibles las 24 horas del da, los 7 [address]  [address] de la [place].   Si tiene un problema urgente y no puede comunicarse con nosotros, puede optar por buscar atencin mdica  en el consultorio de su doctor(a), en una clnica privada, en un centro de atencin urgente o en una sala de emergencias.  Si tiene Engineer, drilling, por favor llame inmediatamente al 911 o vaya a la sala de emergencias.  Nmeros de bper  - Dr. Bary Likes: [phone]  - Dra. Annette Barters: [phone]  - Dr. Felipe Horton: [phone]   En caso de inclemencias del tiempo, por favor llame a Lajuan Pila principal al [phone] para una actualizacin sobre el [place] de cualquier retraso o cierre.  Consejos para la medicacin en dermatologa: Por favor, guarde las cajas en las que vienen los medicamentos de uso tpico para ayudarle a seguir las instrucciones sobre dnde y cmo usarlos. Las farmacias generalmente imprimen las instrucciones del medicamento slo en las cajas y no directamente en los tubos del [place].   Si su medicamento es muy caro, por favor, pngase en contacto con Bettyjane Brunet llamando al [phone] y presione la opcin 4 o envenos un mensaje a travs de Clinical cytogeneticist.   No podemos decirle cul ser su copago por los medicamentos por adelantado ya que esto es diferente dependiendo de la cobertura de su seguro. Sin embargo, es posible que podamos encontrar un medicamento sustituto a Audiological scientist un formulario para que el seguro cubra el medicamento que se considera necesario.   Si se requiere una autorizacin previa para que su compaa de seguros [place] su medicamento, por favor permtanos de 1 a 2 das hbiles para completar este proceso.  Los  precios de los medicamentos varan con frecuencia dependiendo del Environmental consultant de dnde se surte la receta y alguna farmacias pueden ofrecer precios ms baratos.  El sitio web www.goodrx.com tiene cupones para medicamentos de Health and safety inspector. Los precios aqu no tienen en cuenta lo que podra costar con la ayuda del seguro (puede ser ms barato con su seguro), pero el sitio web puede darle el precio si no utiliz Tourist information centre manager.  - Puede imprimir el cupn correspondiente y llevarlo con su receta a la farmacia.  - Tambin puede pasar por nuestra oficina durante el horario de atencin regular y Education officer, museum una tarjeta de cupones de GoodRx.  - Si necesita que su receta se enve electrnicamente a [place] [employer],  informe a nuestra oficina a travs de MyChart de [place] o por telfono llamando al [phone] y presione la opcin 4.

## 2023-11-06 ENCOUNTER — Ambulatory Visit: Payer: Medicare Other | Admitting: Dermatology

## 2024-03-08 ENCOUNTER — Emergency Department
Admission: EM | Admit: 2024-03-08 | Discharge: 2024-03-09 | Disposition: A | Discharge status: Other Acute Inpatient Hospital | Attending: Emergency Medicine | Admitting: Emergency Medicine

## 2024-03-08 ENCOUNTER — Other Ambulatory Visit: Payer: Self-pay

## 2024-03-08 ENCOUNTER — Emergency Department

## 2024-03-08 DIAGNOSIS — R0689 Other abnormalities of breathing: Secondary | ICD-10-CM | POA: Diagnosis not present

## 2024-03-08 DIAGNOSIS — R42 Dizziness and giddiness: Secondary | ICD-10-CM

## 2024-03-08 DIAGNOSIS — R079 Chest pain, unspecified: Secondary | ICD-10-CM | POA: Diagnosis present

## 2024-03-08 DIAGNOSIS — I472 Ventricular tachycardia, unspecified: Secondary | ICD-10-CM | POA: Diagnosis not present

## 2024-03-08 DIAGNOSIS — Z950 Presence of cardiac pacemaker: Secondary | ICD-10-CM | POA: Diagnosis not present

## 2024-03-08 LAB — CBC WITH DIFFERENTIAL/PLATELET
Abs Immature Granulocytes: 0.03 K/uL (ref 0.00–0.07)
Basophils Absolute: 0 K/uL (ref 0.0–0.1)
Basophils Relative: 1 %
Eosinophils Absolute: 0.1 K/uL (ref 0.0–0.5)
Eosinophils Relative: 1 %
HCT: 48 % (ref 39.0–52.0)
Hemoglobin: 16.3 g/dL (ref 13.0–17.0)
Immature Granulocytes: 0 %
Lymphocytes Relative: 11 %
Lymphs Abs: 0.9 K/uL (ref 0.7–4.0)
MCH: 30.9 pg (ref 26.0–34.0)
MCHC: 34 g/dL (ref 30.0–36.0)
MCV: 90.9 fL (ref 80.0–100.0)
Monocytes Absolute: 0.8 K/uL (ref 0.1–1.0)
Monocytes Relative: 10 %
Neutro Abs: 6.2 K/uL (ref 1.7–7.7)
Neutrophils Relative %: 77 %
Platelets: 156 K/uL (ref 150–400)
RBC: 5.28 MIL/uL (ref 4.22–5.81)
RDW: 13.6 % (ref 11.5–15.5)
WBC: 8.1 K/uL (ref 4.0–10.5)
nRBC: 0 % (ref 0.0–0.2)

## 2024-03-08 LAB — COMPREHENSIVE METABOLIC PANEL WITH GFR
ALT: 15 U/L (ref 0–44)
AST: 26 U/L (ref 15–41)
Albumin: 4.4 g/dL (ref 3.5–5.0)
Alkaline Phosphatase: 63 U/L (ref 38–126)
Anion gap: 11 (ref 5–15)
BUN: 36 mg/dL — ABNORMAL HIGH (ref 8–23)
CO2: 24 mmol/L (ref 22–32)
Calcium: 9.3 mg/dL (ref 8.9–10.3)
Chloride: 100 mmol/L (ref 98–111)
Creatinine, Ser: 2.03 mg/dL — ABNORMAL HIGH (ref 0.61–1.24)
GFR, Estimated: 32 mL/min — ABNORMAL LOW (ref >60)
Glucose, Bld: 212 mg/dL — ABNORMAL HIGH (ref 70–99)
Potassium: 4.7 mmol/L (ref 3.5–5.1)
Sodium: 135 mmol/L (ref 135–145)
Total Bilirubin: 0.8 mg/dL (ref 0.0–1.2)
Total Protein: 8 g/dL (ref 6.5–8.1)

## 2024-03-08 LAB — TROPONIN I (HIGH SENSITIVITY): Troponin I (High Sensitivity): 13 ng/L (ref <18)

## 2024-03-08 LAB — BRAIN NATRIURETIC PEPTIDE: B Natriuretic Peptide: 204.4 pg/mL — ABNORMAL HIGH (ref 0.0–100.0)

## 2024-03-08 MED ORDER — AMIODARONE LOAD VIA INFUSION
150.0000 mg | Freq: Once | INTRAVENOUS | Status: AC
  Administered 2024-03-09: 150 mg via INTRAVENOUS
  Filled 2024-03-08: qty 83.34

## 2024-03-08 MED ORDER — AMIODARONE HCL IN DEXTROSE 360-4.14 MG/200ML-% IV SOLN
60.0000 mg/h | INTRAVENOUS | Status: DC
  Administered 2024-03-09: 60 mg/h via INTRAVENOUS
  Filled 2024-03-08: qty 200

## 2024-03-08 MED ORDER — AMIODARONE HCL IN DEXTROSE 360-4.14 MG/200ML-% IV SOLN: 30.0000 mg/h | INTRAVENOUS | Status: DC

## 2024-03-08 NOTE — ED Provider Notes (Signed)
 [facility] Provider Note   Event Date/Time   First MD Initiated Contact with Patient 03/08/24 2146     (approximate) History  Chest Pain  HPI Brandon Wall is a 83 y.o. 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  EMS noted runs of ventricular tachycardia with the longest lasting approximately 12 seconds.  These abnormal rhythms do correlate with patient's presyncopal symptoms.  Patient also endorses chest pain during these episodes.  Patient denies any associated shortness of breath or diaphoresis ROS: Patient currently denies any vision changes, tinnitus, difficulty speaking, facial droop, sore throat, shortness of breath, abdominal pain, nausea/vomiting/diarrhea, dysuria, or weakness/numbness/paresthesias in any extremity   Physical Exam  Triage Vital Signs: ED Triage Vitals  Encounter Vitals Group     BP 03/08/24 2128 111/76     Girls Systolic BP Percentile --      Girls Diastolic BP Percentile --      Boys Systolic BP Percentile --      Boys Diastolic BP Percentile --      Pulse Rate 03/08/24 2128 72     Resp 03/08/24 2128 13     Temp 03/08/24 2128 98 F (36.7 C)     Temp Source 03/08/24 2128 Oral     SpO2 03/08/24 2128 95 %     Weight 03/08/24 2126 227 lb 1.2 oz (103 kg)     Height 03/08/24 2126 6' 3 (1.905 m)     Head Circumference --      Peak Flow --      Pain Score 03/08/24 2125 6     Pain Loc --      Pain Education --      Exclude from Growth Chart --    Most recent vital signs: Vitals:   03/08/24 2128  BP: 111/76  Pulse: 72  Resp: 13  Temp: 98 F (36.7 C)  SpO2: 95%   General: Awake, oriented x4. CV:  Good peripheral perfusion. Resp:  Normal effort. Abd:  No distention. Other:  Elderly overweight Caucasian male resting comfortably in no acute  distress ED Results / Procedures / Treatments  Labs (all labs ordered are listed, but only abnormal results are displayed) Labs Reviewed  COMPREHENSIVE METABOLIC PANEL WITH GFR - Abnormal; Notable for the following components:      Result Value   Glucose, Bld 212 (*)    BUN 36 (*)    Creatinine, Ser 2.03 (*)    GFR, Estimated 32 (*)    All other components within normal limits  BRAIN NATRIURETIC PEPTIDE - Abnormal; Notable for the following components:   B Natriuretic Peptide 204.4 (*)    All other components within normal limits  CBC WITH DIFFERENTIAL/PLATELET  TROPONIN I (HIGH SENSITIVITY)  TROPONIN I (HIGH SENSITIVITY)   EKG ED ECG REPORT I, Artist MARLA Kerns, the attending physician, personally viewed and interpreted this ECG. Date: 03/08/2024 EKG Time: 2129 Rate: 71 Rhythm: normal sinus rhythm QRS Axis: normal Intervals: normal ST/T Wave abnormalities: normal Narrative Interpretation: no evidence of acute ischemia RADIOLOGY ED MD interpretation: Single view portable chest x-ray shows low lung volumes with left base and right perihilar airspace opacity - All radiology independently interpreted and agree with radiology assessment Official radiology report(s): DG Chest Port 1 View Result Date: 03/08/2024 CLINICAL DATA:  chest pain EXAM: PORTABLE CHEST 1 VIEW  COMPARISON:  Chest x-ray 12/20/2020 FINDINGS: Left chest wall dual lead pacemaker defibrillator. Cardiac paddle overlies the left chest limiting view. The heart and mediastinal contours are within normal limits. Atherosclerotic plaque. Low lung volumes with left base airspace opacity. Right perihilar airspace opacity. No pulmonary edema. No pleural effusion. No pneumothorax. No acute osseous abnormality.  Sternotomy wires are intact. IMPRESSION: 1. Low lung volumes with left base and right perihilar airspace opacity. Limited evaluation due to overlying cardiac paddles and pacemaker. Recommend repeat chest x-ray PA and lateral  view with improved inspiratory effort for further evaluation. 2.  Aortic Atherosclerosis (ICD10-I70.0). Electronically Signed   By: Morgane  Naveau M.D.   On: 03/08/2024 22:13   PROCEDURES: Critical Care performed: Yes, see critical care procedure note(s) Procedures CRITICAL CARE Performed by: Gregorio Worley K Uziah Sorter  Total critical care time: 35 minutes  Critical care time was exclusive of separately billable procedures and treating other patients.  Critical care was necessary to treat or prevent imminent or life-threatening deterioration.  Critical care was time spent personally by me on the following activities: development of treatment plan with patient and/or surrogate as well as nursing, discussions with consultants, evaluation of patient's response to treatment, examination of patient, obtaining history from patient or surrogate, ordering and performing treatments and interventions, ordering and review of laboratory studies, ordering and review of radiographic studies, pulse oximetry and re-evaluation of patient's condition.  MEDICATIONS ORDERED IN ED: Medications  amiodarone (NEXTERONE) 1.8 mg/mL load via infusion 150 mg (has no administration in time range)  amiodarone (NEXTERONE PREMIX) 360-4.14 MG/200ML-% (1.8 mg/mL) IV infusion (has no administration in time range)    Followed by  amiodarone (NEXTERONE PREMIX) 360-4.14 MG/200ML-% (1.8 mg/mL) IV infusion (has no administration in time range)   IMPRESSION / MDM / ASSESSMENT AND PLAN / ED COURSE  I reviewed the triage vital signs and the nursing notes.                             The patient is on the cardiac monitor to evaluate for evidence of arrhythmia and/or significant heart rate changes. Patient's presentation is most consistent with acute presentation with potential threat to life or bodily function. Patient is an 83 year old male with an extensive past cardiac history presents via EMS concerning for ventricular tachycardia with  associated chest pain and presyncopal symptoms DDx: ACS, arrhythmia, pneumothorax, myocarditis Plan: CBC, CMP, BNP, troponin, chest x-ray, EKG As patient has showed continued runs of ventricular tachycardia, will start amiodarone bolus and drip  In speaking further to family about these results, they have requested transfer to [facility] as this is where patient receives all of his cardiac care.  I spoke to the on-call cardiologist who is excepted this patient onto a wait list for transfer  Dispo: Transfer to [facility] cardiology    FINAL CLINICAL IMPRESSION(S) / ED DIAGNOSES   Final diagnoses:  Chest pain, unspecified type  Ventricular tachycardia (HCC)  Lightheadedness   Rx / DC Orders   ED Discharge Orders     None      Note:  This document was prepared using Dragon voice recognition software and may include unintentional dictation errors.   Jossie Artist POUR, MD 03/09/24 [phone]

## 2024-03-08 NOTE — ED Triage Notes (Signed)
 Pt to ED by EMS, pt was at dinner with family when he started having severe chest pain. Pt had runs of Vtach with EMS. Pt had to be cardioverted 6 weeks ago, has internal pacemaker/defibrillator. Pt reports chest pain feels like pressure, denies headache or dizziness.   Pt took 324mg  aspirin . Pt A&Ox4.

## 2024-03-09 DIAGNOSIS — R42 Dizziness and giddiness: Secondary | ICD-10-CM | POA: Diagnosis not present

## 2024-03-09 DIAGNOSIS — R079 Chest pain, unspecified: Secondary | ICD-10-CM | POA: Diagnosis not present

## 2024-03-09 DIAGNOSIS — Z743 Need for continuous supervision: Secondary | ICD-10-CM | POA: Diagnosis not present

## 2024-03-09 DIAGNOSIS — I472 Ventricular tachycardia, unspecified: Secondary | ICD-10-CM | POA: Diagnosis not present

## 2024-03-09 LAB — TROPONIN I (HIGH SENSITIVITY): Troponin I (High Sensitivity): 12 ng/L (ref <18)

## 2024-03-09 NOTE — ED Notes (Signed)
 Patient transported from [facility] ED to [facility]. Report given to receiving hospital and to [facility] transporting staff. Pt stable at time of transport, all paperwork including completed EMTALA with patient at time of transport.

## 2024-11-10 ENCOUNTER — Ambulatory Visit: Admitting: Dermatology
